# Patient Record
Sex: Female | Born: 1940 | Hispanic: Refuse to answer | Marital: Married | Smoking: Former smoker
Health system: Southern US, Community
[De-identification: ages and names within clinical notes are randomized; demographics above are authoritative.]

## PROBLEM LIST (undated history)

## (undated) DIAGNOSIS — I10 Essential (primary) hypertension: Secondary | ICD-10-CM

## (undated) DIAGNOSIS — R269 Unspecified abnormalities of gait and mobility: Secondary | ICD-10-CM

## (undated) DIAGNOSIS — J449 Chronic obstructive pulmonary disease, unspecified: Secondary | ICD-10-CM

## (undated) DIAGNOSIS — M1990 Unspecified osteoarthritis, unspecified site: Secondary | ICD-10-CM

## (undated) DIAGNOSIS — I259 Chronic ischemic heart disease, unspecified: Secondary | ICD-10-CM

## (undated) DIAGNOSIS — K5090 Crohn's disease, unspecified, without complications: Secondary | ICD-10-CM

## (undated) DIAGNOSIS — E119 Type 2 diabetes mellitus without complications: Secondary | ICD-10-CM

## (undated) DIAGNOSIS — C801 Malignant (primary) neoplasm, unspecified: Secondary | ICD-10-CM

## (undated) DIAGNOSIS — F17200 Nicotine dependence, unspecified, uncomplicated: Secondary | ICD-10-CM

## (undated) DIAGNOSIS — Z87442 Personal history of urinary calculi: Secondary | ICD-10-CM

## (undated) DIAGNOSIS — Z720 Tobacco use: Secondary | ICD-10-CM

## (undated) DIAGNOSIS — R42 Dizziness and giddiness: Secondary | ICD-10-CM

## (undated) DIAGNOSIS — I82409 Acute embolism and thrombosis of unspecified deep veins of unspecified lower extremity: Secondary | ICD-10-CM

## (undated) DIAGNOSIS — R0600 Dyspnea, unspecified: Secondary | ICD-10-CM

## (undated) DIAGNOSIS — N3281 Overactive bladder: Secondary | ICD-10-CM

## (undated) DIAGNOSIS — I712 Thoracic aortic aneurysm, without rupture: Secondary | ICD-10-CM

## (undated) HISTORY — DX: Thoracic aortic aneurysm, without rupture: I71.2

## (undated) HISTORY — DX: Acute embolism and thrombosis of unspecified deep veins of unspecified lower extremity: I82.409

## (undated) HISTORY — PX: OTHER SURGICAL HISTORY: SHX169

## (undated) HISTORY — DX: Essential (primary) hypertension: I10

## (undated) HISTORY — DX: Dizziness and giddiness: R42

## (undated) HISTORY — PX: TUBAL LIGATION: SHX77

## (undated) HISTORY — DX: Chronic ischemic heart disease, unspecified: I25.9

## (undated) HISTORY — DX: Type 2 diabetes mellitus without complications: E11.9

## (undated) HISTORY — DX: Unspecified abnormalities of gait and mobility: R26.9

## (undated) HISTORY — PX: JOINT REPLACEMENT: SHX530

## (undated) HISTORY — DX: Crohn's disease, unspecified, without complications: K50.90

## (undated) HISTORY — PX: CHOLECYSTECTOMY: SHX55

## (undated) HISTORY — PX: BLADDER SURGERY: SHX569

## (undated) HISTORY — PX: FRACTURE SURGERY: SHX138

## (undated) HISTORY — DX: Tobacco use: Z72.0

---

## 2002-12-15 DIAGNOSIS — C801 Malignant (primary) neoplasm, unspecified: Secondary | ICD-10-CM

## 2002-12-15 HISTORY — DX: Malignant (primary) neoplasm, unspecified: C80.1

## 2013-12-21 DIAGNOSIS — T8132XA Disruption of internal operation (surgical) wound, not elsewhere classified, initial encounter: Secondary | ICD-10-CM | POA: Diagnosis not present

## 2013-12-21 DIAGNOSIS — Q666 Other congenital valgus deformities of feet: Secondary | ICD-10-CM | POA: Diagnosis not present

## 2013-12-21 DIAGNOSIS — M216X9 Other acquired deformities of unspecified foot: Secondary | ICD-10-CM | POA: Diagnosis not present

## 2014-01-05 DIAGNOSIS — Q666 Other congenital valgus deformities of feet: Secondary | ICD-10-CM | POA: Diagnosis not present

## 2014-01-05 DIAGNOSIS — T8132XA Disruption of internal operation (surgical) wound, not elsewhere classified, initial encounter: Secondary | ICD-10-CM | POA: Diagnosis not present

## 2014-01-18 DIAGNOSIS — T8132XA Disruption of internal operation (surgical) wound, not elsewhere classified, initial encounter: Secondary | ICD-10-CM | POA: Diagnosis not present

## 2014-01-18 DIAGNOSIS — Q666 Other congenital valgus deformities of feet: Secondary | ICD-10-CM | POA: Diagnosis not present

## 2014-01-23 DIAGNOSIS — Z7982 Long term (current) use of aspirin: Secondary | ICD-10-CM | POA: Diagnosis not present

## 2014-01-23 DIAGNOSIS — K5090 Crohn's disease, unspecified, without complications: Secondary | ICD-10-CM | POA: Diagnosis not present

## 2014-01-23 DIAGNOSIS — T8189XA Other complications of procedures, not elsewhere classified, initial encounter: Secondary | ICD-10-CM | POA: Diagnosis not present

## 2014-01-23 DIAGNOSIS — Z8551 Personal history of malignant neoplasm of bladder: Secondary | ICD-10-CM | POA: Diagnosis not present

## 2014-01-23 DIAGNOSIS — H259 Unspecified age-related cataract: Secondary | ICD-10-CM | POA: Diagnosis not present

## 2014-01-23 DIAGNOSIS — Z96659 Presence of unspecified artificial knee joint: Secondary | ICD-10-CM | POA: Diagnosis not present

## 2014-01-23 DIAGNOSIS — J449 Chronic obstructive pulmonary disease, unspecified: Secondary | ICD-10-CM | POA: Diagnosis not present

## 2014-01-23 DIAGNOSIS — M159 Polyosteoarthritis, unspecified: Secondary | ICD-10-CM | POA: Diagnosis not present

## 2014-01-23 DIAGNOSIS — I10 Essential (primary) hypertension: Secondary | ICD-10-CM | POA: Diagnosis not present

## 2014-01-23 DIAGNOSIS — M810 Age-related osteoporosis without current pathological fracture: Secondary | ICD-10-CM | POA: Diagnosis not present

## 2014-01-23 DIAGNOSIS — Z4689 Encounter for fitting and adjustment of other specified devices: Secondary | ICD-10-CM | POA: Diagnosis not present

## 2014-01-26 DIAGNOSIS — J449 Chronic obstructive pulmonary disease, unspecified: Secondary | ICD-10-CM | POA: Diagnosis not present

## 2014-01-26 DIAGNOSIS — I10 Essential (primary) hypertension: Secondary | ICD-10-CM | POA: Diagnosis not present

## 2014-01-26 DIAGNOSIS — T8189XA Other complications of procedures, not elsewhere classified, initial encounter: Secondary | ICD-10-CM | POA: Diagnosis not present

## 2014-01-26 DIAGNOSIS — M810 Age-related osteoporosis without current pathological fracture: Secondary | ICD-10-CM | POA: Diagnosis not present

## 2014-01-26 DIAGNOSIS — K5090 Crohn's disease, unspecified, without complications: Secondary | ICD-10-CM | POA: Diagnosis not present

## 2014-01-26 DIAGNOSIS — M159 Polyosteoarthritis, unspecified: Secondary | ICD-10-CM | POA: Diagnosis not present

## 2014-01-28 DIAGNOSIS — M159 Polyosteoarthritis, unspecified: Secondary | ICD-10-CM | POA: Diagnosis not present

## 2014-01-28 DIAGNOSIS — K5090 Crohn's disease, unspecified, without complications: Secondary | ICD-10-CM | POA: Diagnosis not present

## 2014-01-28 DIAGNOSIS — I10 Essential (primary) hypertension: Secondary | ICD-10-CM | POA: Diagnosis not present

## 2014-01-28 DIAGNOSIS — T8189XA Other complications of procedures, not elsewhere classified, initial encounter: Secondary | ICD-10-CM | POA: Diagnosis not present

## 2014-01-28 DIAGNOSIS — M810 Age-related osteoporosis without current pathological fracture: Secondary | ICD-10-CM | POA: Diagnosis not present

## 2014-01-28 DIAGNOSIS — J449 Chronic obstructive pulmonary disease, unspecified: Secondary | ICD-10-CM | POA: Diagnosis not present

## 2014-01-30 DIAGNOSIS — I10 Essential (primary) hypertension: Secondary | ICD-10-CM | POA: Diagnosis not present

## 2014-01-30 DIAGNOSIS — K5090 Crohn's disease, unspecified, without complications: Secondary | ICD-10-CM | POA: Diagnosis not present

## 2014-01-30 DIAGNOSIS — M810 Age-related osteoporosis without current pathological fracture: Secondary | ICD-10-CM | POA: Diagnosis not present

## 2014-01-30 DIAGNOSIS — J449 Chronic obstructive pulmonary disease, unspecified: Secondary | ICD-10-CM | POA: Diagnosis not present

## 2014-01-30 DIAGNOSIS — M159 Polyosteoarthritis, unspecified: Secondary | ICD-10-CM | POA: Diagnosis not present

## 2014-01-30 DIAGNOSIS — T8189XA Other complications of procedures, not elsewhere classified, initial encounter: Secondary | ICD-10-CM | POA: Diagnosis not present

## 2014-02-01 DIAGNOSIS — M79609 Pain in unspecified limb: Secondary | ICD-10-CM | POA: Diagnosis not present

## 2014-02-01 DIAGNOSIS — T8189XA Other complications of procedures, not elsewhere classified, initial encounter: Secondary | ICD-10-CM | POA: Diagnosis not present

## 2014-02-01 DIAGNOSIS — M810 Age-related osteoporosis without current pathological fracture: Secondary | ICD-10-CM | POA: Diagnosis not present

## 2014-02-01 DIAGNOSIS — J449 Chronic obstructive pulmonary disease, unspecified: Secondary | ICD-10-CM | POA: Diagnosis not present

## 2014-02-01 DIAGNOSIS — I10 Essential (primary) hypertension: Secondary | ICD-10-CM | POA: Diagnosis not present

## 2014-02-01 DIAGNOSIS — Q666 Other congenital valgus deformities of feet: Secondary | ICD-10-CM | POA: Diagnosis not present

## 2014-02-01 DIAGNOSIS — M159 Polyosteoarthritis, unspecified: Secondary | ICD-10-CM | POA: Diagnosis not present

## 2014-02-01 DIAGNOSIS — M24876 Other specific joint derangements of unspecified foot, not elsewhere classified: Secondary | ICD-10-CM | POA: Diagnosis not present

## 2014-02-01 DIAGNOSIS — T8132XA Disruption of internal operation (surgical) wound, not elsewhere classified, initial encounter: Secondary | ICD-10-CM | POA: Diagnosis not present

## 2014-02-01 DIAGNOSIS — K5090 Crohn's disease, unspecified, without complications: Secondary | ICD-10-CM | POA: Diagnosis not present

## 2014-02-01 DIAGNOSIS — M24873 Other specific joint derangements of unspecified ankle, not elsewhere classified: Secondary | ICD-10-CM | POA: Diagnosis not present

## 2014-02-03 DIAGNOSIS — M159 Polyosteoarthritis, unspecified: Secondary | ICD-10-CM | POA: Diagnosis not present

## 2014-02-03 DIAGNOSIS — J449 Chronic obstructive pulmonary disease, unspecified: Secondary | ICD-10-CM | POA: Diagnosis not present

## 2014-02-03 DIAGNOSIS — T8189XA Other complications of procedures, not elsewhere classified, initial encounter: Secondary | ICD-10-CM | POA: Diagnosis not present

## 2014-02-03 DIAGNOSIS — M810 Age-related osteoporosis without current pathological fracture: Secondary | ICD-10-CM | POA: Diagnosis not present

## 2014-02-03 DIAGNOSIS — I10 Essential (primary) hypertension: Secondary | ICD-10-CM | POA: Diagnosis not present

## 2014-02-03 DIAGNOSIS — K5090 Crohn's disease, unspecified, without complications: Secondary | ICD-10-CM | POA: Diagnosis not present

## 2014-02-06 DIAGNOSIS — M159 Polyosteoarthritis, unspecified: Secondary | ICD-10-CM | POA: Diagnosis not present

## 2014-02-06 DIAGNOSIS — K5090 Crohn's disease, unspecified, without complications: Secondary | ICD-10-CM | POA: Diagnosis not present

## 2014-02-06 DIAGNOSIS — M810 Age-related osteoporosis without current pathological fracture: Secondary | ICD-10-CM | POA: Diagnosis not present

## 2014-02-06 DIAGNOSIS — I10 Essential (primary) hypertension: Secondary | ICD-10-CM | POA: Diagnosis not present

## 2014-02-06 DIAGNOSIS — T8189XA Other complications of procedures, not elsewhere classified, initial encounter: Secondary | ICD-10-CM | POA: Diagnosis not present

## 2014-02-06 DIAGNOSIS — J449 Chronic obstructive pulmonary disease, unspecified: Secondary | ICD-10-CM | POA: Diagnosis not present

## 2014-02-08 DIAGNOSIS — K5090 Crohn's disease, unspecified, without complications: Secondary | ICD-10-CM | POA: Diagnosis not present

## 2014-02-08 DIAGNOSIS — T8189XA Other complications of procedures, not elsewhere classified, initial encounter: Secondary | ICD-10-CM | POA: Diagnosis not present

## 2014-02-08 DIAGNOSIS — M810 Age-related osteoporosis without current pathological fracture: Secondary | ICD-10-CM | POA: Diagnosis not present

## 2014-02-08 DIAGNOSIS — I10 Essential (primary) hypertension: Secondary | ICD-10-CM | POA: Diagnosis not present

## 2014-02-08 DIAGNOSIS — M159 Polyosteoarthritis, unspecified: Secondary | ICD-10-CM | POA: Diagnosis not present

## 2014-02-08 DIAGNOSIS — J449 Chronic obstructive pulmonary disease, unspecified: Secondary | ICD-10-CM | POA: Diagnosis not present

## 2014-02-10 DIAGNOSIS — M159 Polyosteoarthritis, unspecified: Secondary | ICD-10-CM | POA: Diagnosis not present

## 2014-02-10 DIAGNOSIS — M810 Age-related osteoporosis without current pathological fracture: Secondary | ICD-10-CM | POA: Diagnosis not present

## 2014-02-10 DIAGNOSIS — J449 Chronic obstructive pulmonary disease, unspecified: Secondary | ICD-10-CM | POA: Diagnosis not present

## 2014-02-10 DIAGNOSIS — I10 Essential (primary) hypertension: Secondary | ICD-10-CM | POA: Diagnosis not present

## 2014-02-10 DIAGNOSIS — T8189XA Other complications of procedures, not elsewhere classified, initial encounter: Secondary | ICD-10-CM | POA: Diagnosis not present

## 2014-02-10 DIAGNOSIS — K5090 Crohn's disease, unspecified, without complications: Secondary | ICD-10-CM | POA: Diagnosis not present

## 2014-02-13 DIAGNOSIS — M159 Polyosteoarthritis, unspecified: Secondary | ICD-10-CM | POA: Diagnosis not present

## 2014-02-13 DIAGNOSIS — K5090 Crohn's disease, unspecified, without complications: Secondary | ICD-10-CM | POA: Diagnosis not present

## 2014-02-13 DIAGNOSIS — I10 Essential (primary) hypertension: Secondary | ICD-10-CM | POA: Diagnosis not present

## 2014-02-13 DIAGNOSIS — M810 Age-related osteoporosis without current pathological fracture: Secondary | ICD-10-CM | POA: Diagnosis not present

## 2014-02-13 DIAGNOSIS — J449 Chronic obstructive pulmonary disease, unspecified: Secondary | ICD-10-CM | POA: Diagnosis not present

## 2014-02-13 DIAGNOSIS — T8189XA Other complications of procedures, not elsewhere classified, initial encounter: Secondary | ICD-10-CM | POA: Diagnosis not present

## 2014-02-15 DIAGNOSIS — M79609 Pain in unspecified limb: Secondary | ICD-10-CM | POA: Diagnosis not present

## 2014-02-15 DIAGNOSIS — T8132XA Disruption of internal operation (surgical) wound, not elsewhere classified, initial encounter: Secondary | ICD-10-CM | POA: Diagnosis not present

## 2014-02-15 DIAGNOSIS — L97409 Non-pressure chronic ulcer of unspecified heel and midfoot with unspecified severity: Secondary | ICD-10-CM | POA: Diagnosis not present

## 2014-02-15 DIAGNOSIS — R609 Edema, unspecified: Secondary | ICD-10-CM | POA: Diagnosis not present

## 2014-02-18 DIAGNOSIS — M159 Polyosteoarthritis, unspecified: Secondary | ICD-10-CM | POA: Diagnosis not present

## 2014-02-18 DIAGNOSIS — T8189XA Other complications of procedures, not elsewhere classified, initial encounter: Secondary | ICD-10-CM | POA: Diagnosis not present

## 2014-02-18 DIAGNOSIS — M810 Age-related osteoporosis without current pathological fracture: Secondary | ICD-10-CM | POA: Diagnosis not present

## 2014-02-18 DIAGNOSIS — K5090 Crohn's disease, unspecified, without complications: Secondary | ICD-10-CM | POA: Diagnosis not present

## 2014-02-18 DIAGNOSIS — J449 Chronic obstructive pulmonary disease, unspecified: Secondary | ICD-10-CM | POA: Diagnosis not present

## 2014-02-18 DIAGNOSIS — I10 Essential (primary) hypertension: Secondary | ICD-10-CM | POA: Diagnosis not present

## 2014-02-20 DIAGNOSIS — T8189XA Other complications of procedures, not elsewhere classified, initial encounter: Secondary | ICD-10-CM | POA: Diagnosis not present

## 2014-02-20 DIAGNOSIS — K5090 Crohn's disease, unspecified, without complications: Secondary | ICD-10-CM | POA: Diagnosis not present

## 2014-02-20 DIAGNOSIS — I10 Essential (primary) hypertension: Secondary | ICD-10-CM | POA: Diagnosis not present

## 2014-02-20 DIAGNOSIS — M159 Polyosteoarthritis, unspecified: Secondary | ICD-10-CM | POA: Diagnosis not present

## 2014-02-20 DIAGNOSIS — J449 Chronic obstructive pulmonary disease, unspecified: Secondary | ICD-10-CM | POA: Diagnosis not present

## 2014-02-20 DIAGNOSIS — M810 Age-related osteoporosis without current pathological fracture: Secondary | ICD-10-CM | POA: Diagnosis not present

## 2014-02-22 DIAGNOSIS — J449 Chronic obstructive pulmonary disease, unspecified: Secondary | ICD-10-CM | POA: Diagnosis not present

## 2014-02-22 DIAGNOSIS — M159 Polyosteoarthritis, unspecified: Secondary | ICD-10-CM | POA: Diagnosis not present

## 2014-02-22 DIAGNOSIS — K5090 Crohn's disease, unspecified, without complications: Secondary | ICD-10-CM | POA: Diagnosis not present

## 2014-02-22 DIAGNOSIS — M810 Age-related osteoporosis without current pathological fracture: Secondary | ICD-10-CM | POA: Diagnosis not present

## 2014-02-22 DIAGNOSIS — I10 Essential (primary) hypertension: Secondary | ICD-10-CM | POA: Diagnosis not present

## 2014-02-22 DIAGNOSIS — T8189XA Other complications of procedures, not elsewhere classified, initial encounter: Secondary | ICD-10-CM | POA: Diagnosis not present

## 2014-02-24 DIAGNOSIS — J449 Chronic obstructive pulmonary disease, unspecified: Secondary | ICD-10-CM | POA: Diagnosis not present

## 2014-02-24 DIAGNOSIS — T8189XA Other complications of procedures, not elsewhere classified, initial encounter: Secondary | ICD-10-CM | POA: Diagnosis not present

## 2014-02-24 DIAGNOSIS — M159 Polyosteoarthritis, unspecified: Secondary | ICD-10-CM | POA: Diagnosis not present

## 2014-02-24 DIAGNOSIS — K5090 Crohn's disease, unspecified, without complications: Secondary | ICD-10-CM | POA: Diagnosis not present

## 2014-02-24 DIAGNOSIS — I10 Essential (primary) hypertension: Secondary | ICD-10-CM | POA: Diagnosis not present

## 2014-02-24 DIAGNOSIS — M810 Age-related osteoporosis without current pathological fracture: Secondary | ICD-10-CM | POA: Diagnosis not present

## 2014-02-27 DIAGNOSIS — K5090 Crohn's disease, unspecified, without complications: Secondary | ICD-10-CM | POA: Diagnosis not present

## 2014-02-27 DIAGNOSIS — I10 Essential (primary) hypertension: Secondary | ICD-10-CM | POA: Diagnosis not present

## 2014-02-27 DIAGNOSIS — M159 Polyosteoarthritis, unspecified: Secondary | ICD-10-CM | POA: Diagnosis not present

## 2014-02-27 DIAGNOSIS — T8189XA Other complications of procedures, not elsewhere classified, initial encounter: Secondary | ICD-10-CM | POA: Diagnosis not present

## 2014-02-27 DIAGNOSIS — J449 Chronic obstructive pulmonary disease, unspecified: Secondary | ICD-10-CM | POA: Diagnosis not present

## 2014-02-27 DIAGNOSIS — M810 Age-related osteoporosis without current pathological fracture: Secondary | ICD-10-CM | POA: Diagnosis not present

## 2014-02-28 DIAGNOSIS — I10 Essential (primary) hypertension: Secondary | ICD-10-CM | POA: Diagnosis not present

## 2014-02-28 DIAGNOSIS — J449 Chronic obstructive pulmonary disease, unspecified: Secondary | ICD-10-CM | POA: Diagnosis not present

## 2014-02-28 DIAGNOSIS — Z9889 Other specified postprocedural states: Secondary | ICD-10-CM | POA: Diagnosis not present

## 2014-03-01 DIAGNOSIS — L02619 Cutaneous abscess of unspecified foot: Secondary | ICD-10-CM | POA: Diagnosis not present

## 2014-03-01 DIAGNOSIS — T8132XA Disruption of internal operation (surgical) wound, not elsewhere classified, initial encounter: Secondary | ICD-10-CM | POA: Diagnosis not present

## 2014-03-01 DIAGNOSIS — L03119 Cellulitis of unspecified part of limb: Secondary | ICD-10-CM | POA: Diagnosis not present

## 2014-03-01 DIAGNOSIS — R609 Edema, unspecified: Secondary | ICD-10-CM | POA: Diagnosis not present

## 2014-03-03 DIAGNOSIS — J449 Chronic obstructive pulmonary disease, unspecified: Secondary | ICD-10-CM | POA: Diagnosis not present

## 2014-03-03 DIAGNOSIS — K5090 Crohn's disease, unspecified, without complications: Secondary | ICD-10-CM | POA: Diagnosis not present

## 2014-03-03 DIAGNOSIS — M810 Age-related osteoporosis without current pathological fracture: Secondary | ICD-10-CM | POA: Diagnosis not present

## 2014-03-03 DIAGNOSIS — I10 Essential (primary) hypertension: Secondary | ICD-10-CM | POA: Diagnosis not present

## 2014-03-03 DIAGNOSIS — M159 Polyosteoarthritis, unspecified: Secondary | ICD-10-CM | POA: Diagnosis not present

## 2014-03-03 DIAGNOSIS — T8189XA Other complications of procedures, not elsewhere classified, initial encounter: Secondary | ICD-10-CM | POA: Diagnosis not present

## 2014-03-06 DIAGNOSIS — J449 Chronic obstructive pulmonary disease, unspecified: Secondary | ICD-10-CM | POA: Diagnosis not present

## 2014-03-06 DIAGNOSIS — K5090 Crohn's disease, unspecified, without complications: Secondary | ICD-10-CM | POA: Diagnosis not present

## 2014-03-06 DIAGNOSIS — M159 Polyosteoarthritis, unspecified: Secondary | ICD-10-CM | POA: Diagnosis not present

## 2014-03-06 DIAGNOSIS — T8189XA Other complications of procedures, not elsewhere classified, initial encounter: Secondary | ICD-10-CM | POA: Diagnosis not present

## 2014-03-06 DIAGNOSIS — M810 Age-related osteoporosis without current pathological fracture: Secondary | ICD-10-CM | POA: Diagnosis not present

## 2014-03-06 DIAGNOSIS — I10 Essential (primary) hypertension: Secondary | ICD-10-CM | POA: Diagnosis not present

## 2014-03-08 DIAGNOSIS — K5090 Crohn's disease, unspecified, without complications: Secondary | ICD-10-CM | POA: Diagnosis not present

## 2014-03-08 DIAGNOSIS — M810 Age-related osteoporosis without current pathological fracture: Secondary | ICD-10-CM | POA: Diagnosis not present

## 2014-03-08 DIAGNOSIS — J449 Chronic obstructive pulmonary disease, unspecified: Secondary | ICD-10-CM | POA: Diagnosis not present

## 2014-03-08 DIAGNOSIS — I10 Essential (primary) hypertension: Secondary | ICD-10-CM | POA: Diagnosis not present

## 2014-03-08 DIAGNOSIS — M159 Polyosteoarthritis, unspecified: Secondary | ICD-10-CM | POA: Diagnosis not present

## 2014-03-08 DIAGNOSIS — T8189XA Other complications of procedures, not elsewhere classified, initial encounter: Secondary | ICD-10-CM | POA: Diagnosis not present

## 2014-03-10 DIAGNOSIS — M810 Age-related osteoporosis without current pathological fracture: Secondary | ICD-10-CM | POA: Diagnosis not present

## 2014-03-10 DIAGNOSIS — M159 Polyosteoarthritis, unspecified: Secondary | ICD-10-CM | POA: Diagnosis not present

## 2014-03-10 DIAGNOSIS — J449 Chronic obstructive pulmonary disease, unspecified: Secondary | ICD-10-CM | POA: Diagnosis not present

## 2014-03-10 DIAGNOSIS — T8189XA Other complications of procedures, not elsewhere classified, initial encounter: Secondary | ICD-10-CM | POA: Diagnosis not present

## 2014-03-10 DIAGNOSIS — K5090 Crohn's disease, unspecified, without complications: Secondary | ICD-10-CM | POA: Diagnosis not present

## 2014-03-10 DIAGNOSIS — I10 Essential (primary) hypertension: Secondary | ICD-10-CM | POA: Diagnosis not present

## 2014-03-13 DIAGNOSIS — J449 Chronic obstructive pulmonary disease, unspecified: Secondary | ICD-10-CM | POA: Diagnosis not present

## 2014-03-13 DIAGNOSIS — I10 Essential (primary) hypertension: Secondary | ICD-10-CM | POA: Diagnosis not present

## 2014-03-13 DIAGNOSIS — K5090 Crohn's disease, unspecified, without complications: Secondary | ICD-10-CM | POA: Diagnosis not present

## 2014-03-13 DIAGNOSIS — M810 Age-related osteoporosis without current pathological fracture: Secondary | ICD-10-CM | POA: Diagnosis not present

## 2014-03-13 DIAGNOSIS — M159 Polyosteoarthritis, unspecified: Secondary | ICD-10-CM | POA: Diagnosis not present

## 2014-03-13 DIAGNOSIS — T8189XA Other complications of procedures, not elsewhere classified, initial encounter: Secondary | ICD-10-CM | POA: Diagnosis not present

## 2014-03-15 DIAGNOSIS — Q666 Other congenital valgus deformities of feet: Secondary | ICD-10-CM | POA: Diagnosis not present

## 2014-03-15 DIAGNOSIS — T8132XA Disruption of internal operation (surgical) wound, not elsewhere classified, initial encounter: Secondary | ICD-10-CM | POA: Diagnosis not present

## 2014-03-15 DIAGNOSIS — R609 Edema, unspecified: Secondary | ICD-10-CM | POA: Diagnosis not present

## 2014-03-17 DIAGNOSIS — K5090 Crohn's disease, unspecified, without complications: Secondary | ICD-10-CM | POA: Diagnosis not present

## 2014-03-17 DIAGNOSIS — I10 Essential (primary) hypertension: Secondary | ICD-10-CM | POA: Diagnosis not present

## 2014-03-17 DIAGNOSIS — M810 Age-related osteoporosis without current pathological fracture: Secondary | ICD-10-CM | POA: Diagnosis not present

## 2014-03-17 DIAGNOSIS — J449 Chronic obstructive pulmonary disease, unspecified: Secondary | ICD-10-CM | POA: Diagnosis not present

## 2014-03-17 DIAGNOSIS — T8189XA Other complications of procedures, not elsewhere classified, initial encounter: Secondary | ICD-10-CM | POA: Diagnosis not present

## 2014-03-17 DIAGNOSIS — M159 Polyosteoarthritis, unspecified: Secondary | ICD-10-CM | POA: Diagnosis not present

## 2014-03-22 DIAGNOSIS — M79609 Pain in unspecified limb: Secondary | ICD-10-CM | POA: Diagnosis not present

## 2014-03-22 DIAGNOSIS — T8132XA Disruption of internal operation (surgical) wound, not elsewhere classified, initial encounter: Secondary | ICD-10-CM | POA: Diagnosis not present

## 2014-03-22 DIAGNOSIS — Q666 Other congenital valgus deformities of feet: Secondary | ICD-10-CM | POA: Diagnosis not present

## 2014-04-05 DIAGNOSIS — Q666 Other congenital valgus deformities of feet: Secondary | ICD-10-CM | POA: Diagnosis not present

## 2014-04-05 DIAGNOSIS — T8132XA Disruption of internal operation (surgical) wound, not elsewhere classified, initial encounter: Secondary | ICD-10-CM | POA: Diagnosis not present

## 2014-04-05 DIAGNOSIS — R609 Edema, unspecified: Secondary | ICD-10-CM | POA: Diagnosis not present

## 2014-04-26 DIAGNOSIS — M24573 Contracture, unspecified ankle: Secondary | ICD-10-CM | POA: Diagnosis not present

## 2014-04-26 DIAGNOSIS — T8132XA Disruption of internal operation (surgical) wound, not elsewhere classified, initial encounter: Secondary | ICD-10-CM | POA: Diagnosis not present

## 2014-04-26 DIAGNOSIS — R609 Edema, unspecified: Secondary | ICD-10-CM | POA: Diagnosis not present

## 2014-05-01 DIAGNOSIS — M24876 Other specific joint derangements of unspecified foot, not elsewhere classified: Secondary | ICD-10-CM | POA: Diagnosis not present

## 2014-05-01 DIAGNOSIS — L97309 Non-pressure chronic ulcer of unspecified ankle with unspecified severity: Secondary | ICD-10-CM | POA: Diagnosis not present

## 2014-05-01 DIAGNOSIS — R262 Difficulty in walking, not elsewhere classified: Secondary | ICD-10-CM | POA: Diagnosis not present

## 2014-05-01 DIAGNOSIS — M24873 Other specific joint derangements of unspecified ankle, not elsewhere classified: Secondary | ICD-10-CM | POA: Diagnosis not present

## 2014-05-03 DIAGNOSIS — M24873 Other specific joint derangements of unspecified ankle, not elsewhere classified: Secondary | ICD-10-CM | POA: Diagnosis not present

## 2014-05-03 DIAGNOSIS — L97309 Non-pressure chronic ulcer of unspecified ankle with unspecified severity: Secondary | ICD-10-CM | POA: Diagnosis not present

## 2014-05-03 DIAGNOSIS — R262 Difficulty in walking, not elsewhere classified: Secondary | ICD-10-CM | POA: Diagnosis not present

## 2014-05-10 DIAGNOSIS — N390 Urinary tract infection, site not specified: Secondary | ICD-10-CM | POA: Diagnosis not present

## 2014-05-10 DIAGNOSIS — N3941 Urge incontinence: Secondary | ICD-10-CM | POA: Diagnosis not present

## 2014-05-10 DIAGNOSIS — M24876 Other specific joint derangements of unspecified foot, not elsewhere classified: Secondary | ICD-10-CM | POA: Diagnosis not present

## 2014-05-10 DIAGNOSIS — Q619 Cystic kidney disease, unspecified: Secondary | ICD-10-CM | POA: Diagnosis not present

## 2014-05-10 DIAGNOSIS — L97309 Non-pressure chronic ulcer of unspecified ankle with unspecified severity: Secondary | ICD-10-CM | POA: Diagnosis not present

## 2014-05-10 DIAGNOSIS — M24873 Other specific joint derangements of unspecified ankle, not elsewhere classified: Secondary | ICD-10-CM | POA: Diagnosis not present

## 2014-05-10 DIAGNOSIS — R262 Difficulty in walking, not elsewhere classified: Secondary | ICD-10-CM | POA: Diagnosis not present

## 2014-05-17 DIAGNOSIS — T8132XA Disruption of internal operation (surgical) wound, not elsewhere classified, initial encounter: Secondary | ICD-10-CM | POA: Diagnosis not present

## 2014-05-17 DIAGNOSIS — Q666 Other congenital valgus deformities of feet: Secondary | ICD-10-CM | POA: Diagnosis not present

## 2014-05-17 DIAGNOSIS — S93336A Other dislocation of unspecified foot, initial encounter: Secondary | ICD-10-CM | POA: Diagnosis not present

## 2014-06-02 DIAGNOSIS — R262 Difficulty in walking, not elsewhere classified: Secondary | ICD-10-CM | POA: Diagnosis not present

## 2014-06-02 DIAGNOSIS — L97309 Non-pressure chronic ulcer of unspecified ankle with unspecified severity: Secondary | ICD-10-CM | POA: Diagnosis not present

## 2014-06-02 DIAGNOSIS — M24873 Other specific joint derangements of unspecified ankle, not elsewhere classified: Secondary | ICD-10-CM | POA: Diagnosis not present

## 2014-06-02 DIAGNOSIS — M24876 Other specific joint derangements of unspecified foot, not elsewhere classified: Secondary | ICD-10-CM | POA: Diagnosis not present

## 2014-06-07 DIAGNOSIS — M24876 Other specific joint derangements of unspecified foot, not elsewhere classified: Secondary | ICD-10-CM | POA: Diagnosis not present

## 2014-06-07 DIAGNOSIS — L97309 Non-pressure chronic ulcer of unspecified ankle with unspecified severity: Secondary | ICD-10-CM | POA: Diagnosis not present

## 2014-06-07 DIAGNOSIS — M24873 Other specific joint derangements of unspecified ankle, not elsewhere classified: Secondary | ICD-10-CM | POA: Diagnosis not present

## 2014-06-07 DIAGNOSIS — R262 Difficulty in walking, not elsewhere classified: Secondary | ICD-10-CM | POA: Diagnosis not present

## 2014-06-14 DIAGNOSIS — M79609 Pain in unspecified limb: Secondary | ICD-10-CM | POA: Diagnosis not present

## 2014-06-14 DIAGNOSIS — R609 Edema, unspecified: Secondary | ICD-10-CM | POA: Diagnosis not present

## 2014-06-14 DIAGNOSIS — L97409 Non-pressure chronic ulcer of unspecified heel and midfoot with unspecified severity: Secondary | ICD-10-CM | POA: Diagnosis not present

## 2014-06-22 DIAGNOSIS — T8132XA Disruption of internal operation (surgical) wound, not elsewhere classified, initial encounter: Secondary | ICD-10-CM | POA: Diagnosis not present

## 2014-06-22 DIAGNOSIS — R609 Edema, unspecified: Secondary | ICD-10-CM | POA: Diagnosis not present

## 2014-06-28 DIAGNOSIS — L02619 Cutaneous abscess of unspecified foot: Secondary | ICD-10-CM | POA: Diagnosis not present

## 2014-06-28 DIAGNOSIS — R609 Edema, unspecified: Secondary | ICD-10-CM | POA: Diagnosis not present

## 2014-06-28 DIAGNOSIS — T8131XA Disruption of external operation (surgical) wound, not elsewhere classified, initial encounter: Secondary | ICD-10-CM | POA: Diagnosis not present

## 2014-07-05 DIAGNOSIS — Q666 Other congenital valgus deformities of feet: Secondary | ICD-10-CM | POA: Diagnosis not present

## 2014-07-05 DIAGNOSIS — R609 Edema, unspecified: Secondary | ICD-10-CM | POA: Diagnosis not present

## 2014-07-05 DIAGNOSIS — T8132XA Disruption of internal operation (surgical) wound, not elsewhere classified, initial encounter: Secondary | ICD-10-CM | POA: Diagnosis not present

## 2014-07-12 DIAGNOSIS — Q666 Other congenital valgus deformities of feet: Secondary | ICD-10-CM | POA: Diagnosis not present

## 2014-07-12 DIAGNOSIS — S91309A Unspecified open wound, unspecified foot, initial encounter: Secondary | ICD-10-CM | POA: Diagnosis not present

## 2014-07-12 DIAGNOSIS — R609 Edema, unspecified: Secondary | ICD-10-CM | POA: Diagnosis not present

## 2014-07-26 DIAGNOSIS — T8132XA Disruption of internal operation (surgical) wound, not elsewhere classified, initial encounter: Secondary | ICD-10-CM | POA: Diagnosis not present

## 2014-07-26 DIAGNOSIS — R609 Edema, unspecified: Secondary | ICD-10-CM | POA: Diagnosis not present

## 2014-07-26 DIAGNOSIS — R269 Unspecified abnormalities of gait and mobility: Secondary | ICD-10-CM | POA: Diagnosis not present

## 2014-08-02 DIAGNOSIS — R609 Edema, unspecified: Secondary | ICD-10-CM | POA: Diagnosis not present

## 2014-08-02 DIAGNOSIS — R269 Unspecified abnormalities of gait and mobility: Secondary | ICD-10-CM | POA: Diagnosis not present

## 2014-08-02 DIAGNOSIS — T8132XA Disruption of internal operation (surgical) wound, not elsewhere classified, initial encounter: Secondary | ICD-10-CM | POA: Diagnosis not present

## 2014-08-09 DIAGNOSIS — M24876 Other specific joint derangements of unspecified foot, not elsewhere classified: Secondary | ICD-10-CM | POA: Diagnosis not present

## 2014-08-09 DIAGNOSIS — T8132XA Disruption of internal operation (surgical) wound, not elsewhere classified, initial encounter: Secondary | ICD-10-CM | POA: Diagnosis not present

## 2014-08-09 DIAGNOSIS — Q666 Other congenital valgus deformities of feet: Secondary | ICD-10-CM | POA: Diagnosis not present

## 2014-08-09 DIAGNOSIS — M24873 Other specific joint derangements of unspecified ankle, not elsewhere classified: Secondary | ICD-10-CM | POA: Diagnosis not present

## 2014-08-30 DIAGNOSIS — S91309A Unspecified open wound, unspecified foot, initial encounter: Secondary | ICD-10-CM | POA: Diagnosis not present

## 2014-08-30 DIAGNOSIS — R609 Edema, unspecified: Secondary | ICD-10-CM | POA: Diagnosis not present

## 2014-08-30 DIAGNOSIS — Q666 Other congenital valgus deformities of feet: Secondary | ICD-10-CM | POA: Diagnosis not present

## 2014-09-19 DIAGNOSIS — Z85828 Personal history of other malignant neoplasm of skin: Secondary | ICD-10-CM | POA: Diagnosis not present

## 2014-09-20 DIAGNOSIS — R339 Retention of urine, unspecified: Secondary | ICD-10-CM | POA: Diagnosis not present

## 2014-09-20 DIAGNOSIS — Q6100 Congenital renal cyst, unspecified: Secondary | ICD-10-CM | POA: Diagnosis not present

## 2014-09-26 DIAGNOSIS — R339 Retention of urine, unspecified: Secondary | ICD-10-CM | POA: Diagnosis not present

## 2014-10-02 DIAGNOSIS — R600 Localized edema: Secondary | ICD-10-CM | POA: Diagnosis not present

## 2014-10-02 DIAGNOSIS — M79672 Pain in left foot: Secondary | ICD-10-CM | POA: Diagnosis not present

## 2014-10-02 DIAGNOSIS — L97423 Non-pressure chronic ulcer of left heel and midfoot with necrosis of muscle: Secondary | ICD-10-CM | POA: Diagnosis not present

## 2014-10-03 DIAGNOSIS — M1990 Unspecified osteoarthritis, unspecified site: Secondary | ICD-10-CM | POA: Diagnosis not present

## 2014-10-03 DIAGNOSIS — I10 Essential (primary) hypertension: Secondary | ICD-10-CM | POA: Diagnosis not present

## 2014-10-03 DIAGNOSIS — J449 Chronic obstructive pulmonary disease, unspecified: Secondary | ICD-10-CM | POA: Diagnosis not present

## 2014-10-03 DIAGNOSIS — L97529 Non-pressure chronic ulcer of other part of left foot with unspecified severity: Secondary | ICD-10-CM | POA: Diagnosis not present

## 2014-10-03 DIAGNOSIS — F17210 Nicotine dependence, cigarettes, uncomplicated: Secondary | ICD-10-CM | POA: Diagnosis not present

## 2014-10-03 DIAGNOSIS — Z79899 Other long term (current) drug therapy: Secondary | ICD-10-CM | POA: Diagnosis not present

## 2014-10-03 DIAGNOSIS — K5090 Crohn's disease, unspecified, without complications: Secondary | ICD-10-CM | POA: Diagnosis not present

## 2014-10-03 DIAGNOSIS — M810 Age-related osteoporosis without current pathological fracture: Secondary | ICD-10-CM | POA: Diagnosis not present

## 2014-10-03 DIAGNOSIS — Z7982 Long term (current) use of aspirin: Secondary | ICD-10-CM | POA: Diagnosis not present

## 2014-10-04 DIAGNOSIS — F17210 Nicotine dependence, cigarettes, uncomplicated: Secondary | ICD-10-CM | POA: Diagnosis not present

## 2014-10-04 DIAGNOSIS — M1990 Unspecified osteoarthritis, unspecified site: Secondary | ICD-10-CM | POA: Diagnosis not present

## 2014-10-04 DIAGNOSIS — L97529 Non-pressure chronic ulcer of other part of left foot with unspecified severity: Secondary | ICD-10-CM | POA: Diagnosis not present

## 2014-10-04 DIAGNOSIS — J449 Chronic obstructive pulmonary disease, unspecified: Secondary | ICD-10-CM | POA: Diagnosis not present

## 2014-10-04 DIAGNOSIS — M87075 Idiopathic aseptic necrosis of left foot: Secondary | ICD-10-CM | POA: Diagnosis not present

## 2014-10-04 DIAGNOSIS — I10 Essential (primary) hypertension: Secondary | ICD-10-CM | POA: Diagnosis not present

## 2014-10-04 DIAGNOSIS — Z79899 Other long term (current) drug therapy: Secondary | ICD-10-CM | POA: Diagnosis not present

## 2014-10-18 DIAGNOSIS — R600 Localized edema: Secondary | ICD-10-CM | POA: Diagnosis not present

## 2014-10-18 DIAGNOSIS — L97423 Non-pressure chronic ulcer of left heel and midfoot with necrosis of muscle: Secondary | ICD-10-CM | POA: Diagnosis not present

## 2014-10-18 DIAGNOSIS — S91302D Unspecified open wound, left foot, subsequent encounter: Secondary | ICD-10-CM | POA: Diagnosis not present

## 2014-10-18 DIAGNOSIS — M79672 Pain in left foot: Secondary | ICD-10-CM | POA: Diagnosis not present

## 2014-10-25 DIAGNOSIS — R600 Localized edema: Secondary | ICD-10-CM | POA: Diagnosis not present

## 2014-10-25 DIAGNOSIS — M79672 Pain in left foot: Secondary | ICD-10-CM | POA: Diagnosis not present

## 2014-10-25 DIAGNOSIS — S91302D Unspecified open wound, left foot, subsequent encounter: Secondary | ICD-10-CM | POA: Diagnosis not present

## 2014-10-25 DIAGNOSIS — L97423 Non-pressure chronic ulcer of left heel and midfoot with necrosis of muscle: Secondary | ICD-10-CM | POA: Diagnosis not present

## 2014-11-01 DIAGNOSIS — L97423 Non-pressure chronic ulcer of left heel and midfoot with necrosis of muscle: Secondary | ICD-10-CM | POA: Diagnosis not present

## 2014-11-01 DIAGNOSIS — R262 Difficulty in walking, not elsewhere classified: Secondary | ICD-10-CM | POA: Diagnosis not present

## 2014-11-01 DIAGNOSIS — S91302D Unspecified open wound, left foot, subsequent encounter: Secondary | ICD-10-CM | POA: Diagnosis not present

## 2014-11-01 DIAGNOSIS — M79672 Pain in left foot: Secondary | ICD-10-CM | POA: Diagnosis not present

## 2014-11-01 DIAGNOSIS — R600 Localized edema: Secondary | ICD-10-CM | POA: Diagnosis not present

## 2014-11-15 DIAGNOSIS — S93302D Unspecified subluxation of left foot, subsequent encounter: Secondary | ICD-10-CM | POA: Diagnosis not present

## 2014-11-15 DIAGNOSIS — R600 Localized edema: Secondary | ICD-10-CM | POA: Diagnosis not present

## 2014-11-15 DIAGNOSIS — Q664 Congenital talipes calcaneovalgus: Secondary | ICD-10-CM | POA: Diagnosis not present

## 2014-11-28 DIAGNOSIS — N3281 Overactive bladder: Secondary | ICD-10-CM | POA: Diagnosis not present

## 2014-11-28 DIAGNOSIS — J449 Chronic obstructive pulmonary disease, unspecified: Secondary | ICD-10-CM | POA: Diagnosis not present

## 2014-11-28 DIAGNOSIS — I10 Essential (primary) hypertension: Secondary | ICD-10-CM | POA: Diagnosis not present

## 2014-11-28 DIAGNOSIS — M810 Age-related osteoporosis without current pathological fracture: Secondary | ICD-10-CM | POA: Diagnosis not present

## 2014-11-29 DIAGNOSIS — D485 Neoplasm of uncertain behavior of skin: Secondary | ICD-10-CM | POA: Diagnosis not present

## 2014-11-29 DIAGNOSIS — L858 Other specified epidermal thickening: Secondary | ICD-10-CM | POA: Diagnosis not present

## 2014-12-04 DIAGNOSIS — S91302D Unspecified open wound, left foot, subsequent encounter: Secondary | ICD-10-CM | POA: Diagnosis not present

## 2014-12-04 DIAGNOSIS — Q600 Renal agenesis, unilateral: Secondary | ICD-10-CM | POA: Diagnosis not present

## 2014-12-04 DIAGNOSIS — R600 Localized edema: Secondary | ICD-10-CM | POA: Diagnosis not present

## 2014-12-18 DIAGNOSIS — Z1231 Encounter for screening mammogram for malignant neoplasm of breast: Secondary | ICD-10-CM | POA: Diagnosis not present

## 2015-01-10 DIAGNOSIS — R600 Localized edema: Secondary | ICD-10-CM | POA: Diagnosis not present

## 2015-01-10 DIAGNOSIS — S91302D Unspecified open wound, left foot, subsequent encounter: Secondary | ICD-10-CM | POA: Diagnosis not present

## 2015-01-10 DIAGNOSIS — Q664 Congenital talipes calcaneovalgus: Secondary | ICD-10-CM | POA: Diagnosis not present

## 2015-01-10 DIAGNOSIS — M79672 Pain in left foot: Secondary | ICD-10-CM | POA: Diagnosis not present

## 2015-01-10 DIAGNOSIS — R262 Difficulty in walking, not elsewhere classified: Secondary | ICD-10-CM | POA: Diagnosis not present

## 2015-01-19 DIAGNOSIS — N390 Urinary tract infection, site not specified: Secondary | ICD-10-CM | POA: Diagnosis not present

## 2015-01-19 DIAGNOSIS — N3941 Urge incontinence: Secondary | ICD-10-CM | POA: Diagnosis not present

## 2015-01-19 DIAGNOSIS — R339 Retention of urine, unspecified: Secondary | ICD-10-CM | POA: Diagnosis not present

## 2015-01-19 DIAGNOSIS — R350 Frequency of micturition: Secondary | ICD-10-CM | POA: Diagnosis not present

## 2015-02-07 DIAGNOSIS — C679 Malignant neoplasm of bladder, unspecified: Secondary | ICD-10-CM | POA: Diagnosis not present

## 2015-02-09 DIAGNOSIS — I10 Essential (primary) hypertension: Secondary | ICD-10-CM | POA: Diagnosis not present

## 2015-02-11 DIAGNOSIS — S46811A Strain of other muscles, fascia and tendons at shoulder and upper arm level, right arm, initial encounter: Secondary | ICD-10-CM | POA: Diagnosis not present

## 2015-02-14 DIAGNOSIS — R0602 Shortness of breath: Secondary | ICD-10-CM | POA: Diagnosis not present

## 2015-02-14 DIAGNOSIS — Z0000 Encounter for general adult medical examination without abnormal findings: Secondary | ICD-10-CM | POA: Diagnosis not present

## 2015-02-14 DIAGNOSIS — M25551 Pain in right hip: Secondary | ICD-10-CM | POA: Diagnosis not present

## 2015-02-21 DIAGNOSIS — M25519 Pain in unspecified shoulder: Secondary | ICD-10-CM | POA: Diagnosis not present

## 2015-02-21 DIAGNOSIS — M25559 Pain in unspecified hip: Secondary | ICD-10-CM | POA: Diagnosis not present

## 2015-02-21 DIAGNOSIS — I10 Essential (primary) hypertension: Secondary | ICD-10-CM | POA: Diagnosis not present

## 2015-02-22 DIAGNOSIS — I82412 Acute embolism and thrombosis of left femoral vein: Secondary | ICD-10-CM | POA: Diagnosis not present

## 2015-03-05 DIAGNOSIS — Z85828 Personal history of other malignant neoplasm of skin: Secondary | ICD-10-CM | POA: Diagnosis not present

## 2015-03-06 DIAGNOSIS — M25551 Pain in right hip: Secondary | ICD-10-CM | POA: Diagnosis not present

## 2015-03-14 DIAGNOSIS — M25559 Pain in unspecified hip: Secondary | ICD-10-CM | POA: Diagnosis not present

## 2015-03-14 DIAGNOSIS — M25519 Pain in unspecified shoulder: Secondary | ICD-10-CM | POA: Diagnosis not present

## 2015-04-10 DIAGNOSIS — M722 Plantar fascial fibromatosis: Secondary | ICD-10-CM | POA: Diagnosis not present

## 2015-04-10 DIAGNOSIS — Q664 Congenital talipes calcaneovalgus: Secondary | ICD-10-CM | POA: Diagnosis not present

## 2015-04-10 DIAGNOSIS — M79672 Pain in left foot: Secondary | ICD-10-CM | POA: Diagnosis not present

## 2015-04-10 DIAGNOSIS — M19072 Primary osteoarthritis, left ankle and foot: Secondary | ICD-10-CM | POA: Diagnosis not present

## 2015-04-10 DIAGNOSIS — R600 Localized edema: Secondary | ICD-10-CM | POA: Diagnosis not present

## 2015-04-10 DIAGNOSIS — S91302D Unspecified open wound, left foot, subsequent encounter: Secondary | ICD-10-CM | POA: Diagnosis not present

## 2015-04-10 DIAGNOSIS — R262 Difficulty in walking, not elsewhere classified: Secondary | ICD-10-CM | POA: Diagnosis not present

## 2015-04-12 DIAGNOSIS — R55 Syncope and collapse: Secondary | ICD-10-CM | POA: Diagnosis not present

## 2015-04-12 DIAGNOSIS — I10 Essential (primary) hypertension: Secondary | ICD-10-CM | POA: Diagnosis not present

## 2015-04-12 DIAGNOSIS — M25559 Pain in unspecified hip: Secondary | ICD-10-CM | POA: Diagnosis not present

## 2015-04-19 DIAGNOSIS — K648 Other hemorrhoids: Secondary | ICD-10-CM | POA: Diagnosis not present

## 2015-04-19 DIAGNOSIS — K5010 Crohn's disease of large intestine without complications: Secondary | ICD-10-CM | POA: Diagnosis not present

## 2015-04-19 DIAGNOSIS — K5090 Crohn's disease, unspecified, without complications: Secondary | ICD-10-CM | POA: Diagnosis not present

## 2015-04-19 DIAGNOSIS — Z86010 Personal history of colonic polyps: Secondary | ICD-10-CM | POA: Diagnosis not present

## 2015-04-19 DIAGNOSIS — K635 Polyp of colon: Secondary | ICD-10-CM | POA: Diagnosis not present

## 2015-04-20 DIAGNOSIS — R55 Syncope and collapse: Secondary | ICD-10-CM | POA: Diagnosis not present

## 2015-04-24 DIAGNOSIS — I6529 Occlusion and stenosis of unspecified carotid artery: Secondary | ICD-10-CM | POA: Diagnosis not present

## 2015-04-24 DIAGNOSIS — R42 Dizziness and giddiness: Secondary | ICD-10-CM | POA: Diagnosis not present

## 2015-04-24 DIAGNOSIS — R55 Syncope and collapse: Secondary | ICD-10-CM | POA: Diagnosis not present

## 2015-04-24 DIAGNOSIS — R9082 White matter disease, unspecified: Secondary | ICD-10-CM | POA: Diagnosis not present

## 2015-05-09 DIAGNOSIS — F17210 Nicotine dependence, cigarettes, uncomplicated: Secondary | ICD-10-CM | POA: Diagnosis not present

## 2015-05-09 DIAGNOSIS — R0602 Shortness of breath: Secondary | ICD-10-CM | POA: Diagnosis not present

## 2015-05-09 DIAGNOSIS — R55 Syncope and collapse: Secondary | ICD-10-CM | POA: Diagnosis not present

## 2015-05-09 DIAGNOSIS — I519 Heart disease, unspecified: Secondary | ICD-10-CM | POA: Diagnosis not present

## 2015-05-09 DIAGNOSIS — J449 Chronic obstructive pulmonary disease, unspecified: Secondary | ICD-10-CM | POA: Diagnosis not present

## 2015-05-09 DIAGNOSIS — I340 Nonrheumatic mitral (valve) insufficiency: Secondary | ICD-10-CM | POA: Diagnosis not present

## 2015-05-11 DIAGNOSIS — R55 Syncope and collapse: Secondary | ICD-10-CM | POA: Diagnosis not present

## 2015-05-16 DIAGNOSIS — N390 Urinary tract infection, site not specified: Secondary | ICD-10-CM | POA: Diagnosis not present

## 2015-05-16 DIAGNOSIS — R55 Syncope and collapse: Secondary | ICD-10-CM | POA: Diagnosis not present

## 2015-05-16 DIAGNOSIS — R0602 Shortness of breath: Secondary | ICD-10-CM | POA: Diagnosis not present

## 2015-05-23 DIAGNOSIS — R0602 Shortness of breath: Secondary | ICD-10-CM | POA: Diagnosis not present

## 2015-05-23 DIAGNOSIS — R55 Syncope and collapse: Secondary | ICD-10-CM | POA: Diagnosis not present

## 2015-05-23 DIAGNOSIS — I6523 Occlusion and stenosis of bilateral carotid arteries: Secondary | ICD-10-CM | POA: Diagnosis not present

## 2015-05-23 DIAGNOSIS — J449 Chronic obstructive pulmonary disease, unspecified: Secondary | ICD-10-CM | POA: Diagnosis not present

## 2015-05-23 DIAGNOSIS — I10 Essential (primary) hypertension: Secondary | ICD-10-CM | POA: Diagnosis not present

## 2015-06-08 DIAGNOSIS — N393 Stress incontinence (female) (male): Secondary | ICD-10-CM | POA: Diagnosis not present

## 2015-06-08 DIAGNOSIS — C679 Malignant neoplasm of bladder, unspecified: Secondary | ICD-10-CM | POA: Diagnosis not present

## 2015-06-12 DIAGNOSIS — M722 Plantar fascial fibromatosis: Secondary | ICD-10-CM | POA: Diagnosis not present

## 2015-06-12 DIAGNOSIS — M19072 Primary osteoarthritis, left ankle and foot: Secondary | ICD-10-CM | POA: Diagnosis not present

## 2015-06-12 DIAGNOSIS — R262 Difficulty in walking, not elsewhere classified: Secondary | ICD-10-CM | POA: Diagnosis not present

## 2015-06-12 DIAGNOSIS — Q664 Congenital talipes calcaneovalgus: Secondary | ICD-10-CM | POA: Diagnosis not present

## 2015-06-13 DIAGNOSIS — I10 Essential (primary) hypertension: Secondary | ICD-10-CM | POA: Diagnosis not present

## 2015-06-13 DIAGNOSIS — R55 Syncope and collapse: Secondary | ICD-10-CM | POA: Diagnosis not present

## 2015-06-14 DIAGNOSIS — Z8679 Personal history of other diseases of the circulatory system: Secondary | ICD-10-CM | POA: Diagnosis not present

## 2015-09-03 DIAGNOSIS — N3000 Acute cystitis without hematuria: Secondary | ICD-10-CM | POA: Diagnosis not present

## 2015-10-07 DIAGNOSIS — G8929 Other chronic pain: Secondary | ICD-10-CM | POA: Diagnosis not present

## 2015-10-07 DIAGNOSIS — Z23 Encounter for immunization: Secondary | ICD-10-CM | POA: Diagnosis not present

## 2015-10-07 DIAGNOSIS — M25511 Pain in right shoulder: Secondary | ICD-10-CM | POA: Diagnosis not present

## 2015-10-16 DIAGNOSIS — M25572 Pain in left ankle and joints of left foot: Secondary | ICD-10-CM | POA: Diagnosis not present

## 2015-10-16 DIAGNOSIS — K5090 Crohn's disease, unspecified, without complications: Secondary | ICD-10-CM | POA: Diagnosis not present

## 2015-10-16 DIAGNOSIS — F17200 Nicotine dependence, unspecified, uncomplicated: Secondary | ICD-10-CM | POA: Diagnosis not present

## 2015-10-16 DIAGNOSIS — I10 Essential (primary) hypertension: Secondary | ICD-10-CM | POA: Diagnosis not present

## 2015-10-16 DIAGNOSIS — Z85828 Personal history of other malignant neoplasm of skin: Secondary | ICD-10-CM | POA: Diagnosis not present

## 2015-10-16 DIAGNOSIS — J449 Chronic obstructive pulmonary disease, unspecified: Secondary | ICD-10-CM | POA: Diagnosis not present

## 2015-10-16 DIAGNOSIS — Z8551 Personal history of malignant neoplasm of bladder: Secondary | ICD-10-CM | POA: Diagnosis not present

## 2015-10-25 ENCOUNTER — Other Ambulatory Visit: Payer: Self-pay | Admitting: Acute Care

## 2015-10-25 DIAGNOSIS — F17210 Nicotine dependence, cigarettes, uncomplicated: Secondary | ICD-10-CM

## 2015-10-26 DIAGNOSIS — L570 Actinic keratosis: Secondary | ICD-10-CM | POA: Diagnosis not present

## 2015-10-26 DIAGNOSIS — L821 Other seborrheic keratosis: Secondary | ICD-10-CM | POA: Diagnosis not present

## 2015-10-26 DIAGNOSIS — Z85828 Personal history of other malignant neoplasm of skin: Secondary | ICD-10-CM | POA: Diagnosis not present

## 2015-10-26 DIAGNOSIS — X32XXXA Exposure to sunlight, initial encounter: Secondary | ICD-10-CM | POA: Diagnosis not present

## 2015-10-26 DIAGNOSIS — Z08 Encounter for follow-up examination after completed treatment for malignant neoplasm: Secondary | ICD-10-CM | POA: Diagnosis not present

## 2015-10-26 DIAGNOSIS — C44629 Squamous cell carcinoma of skin of left upper limb, including shoulder: Secondary | ICD-10-CM | POA: Diagnosis not present

## 2015-10-26 DIAGNOSIS — D225 Melanocytic nevi of trunk: Secondary | ICD-10-CM | POA: Diagnosis not present

## 2015-10-29 ENCOUNTER — Ambulatory Visit
Admission: RE | Admit: 2015-10-29 | Discharge: 2015-10-29 | Disposition: A | Discharge status: Home / Self Care | Payer: Medicare Other | Source: Ambulatory Visit | Attending: Acute Care | Admitting: Acute Care

## 2015-10-29 ENCOUNTER — Encounter: Payer: Self-pay | Admitting: Acute Care

## 2015-10-29 ENCOUNTER — Ambulatory Visit (INDEPENDENT_AMBULATORY_CARE_PROVIDER_SITE_OTHER): Payer: Medicare Other | Admitting: Acute Care

## 2015-10-29 DIAGNOSIS — F17210 Nicotine dependence, cigarettes, uncomplicated: Secondary | ICD-10-CM

## 2015-10-29 DIAGNOSIS — Z87891 Personal history of nicotine dependence: Secondary | ICD-10-CM | POA: Diagnosis not present

## 2015-10-29 NOTE — Progress Notes (Signed)
Shared Decision Making Visit Lung Cancer Screening Program [phone])   Eligibility:  Age 74 y.o.  Pack Years Smoking History Calculation 60 pack years (# packs/per year x # years smoked)  Recent History of coughing up blood  no  Unexplained weight loss? no ( >Than 15 pounds within the last 6 months )  Prior History Lung / other cancer No. ( Bladder cancer in 2006, no yearly scans.) (Diagnosis within the last 5 years already requiring surveillance chest CT Scans).  Smoking Status Current Smoker  Former Smokers: Years since quit:NA  Quit Date: NA  Visit Components:  Discussion included one or more decision making aids. yes  Discussion included risk/benefits of screening. yes  Discussion included potential follow up diagnostic testing for abnormal scans. yes  Discussion included meaning and risk of over diagnosis. yes  Discussion included meaning and risk of False Positives. yes  Discussion included meaning of total radiation exposure. yes  Counseling Included:  Importance of adherence to annual lung cancer LDCT screening. yes  Impact of comorbidities on ability to participate in the program. yes  Ability and willingness to under diagnostic treatment. yes  Smoking Cessation Counseling:  Current Smokers:   Discussed importance of smoking cessation. yes  Information about tobacco cessation classes and interventions provided to patient. yes  Patient provided with "ticket" for LDCT Scan. yes  Symptomatic Patient. no  Counseling: NA  Diagnosis Code: Tobacco Use Z72.0  Asymptomatic Patient yes  Counseling (Intermediate counseling: > three minutes counseling) [id number]  Former Smokers:   Discussed the importance of maintaining cigarette abstinence. Current smoker, see above  Diagnosis Code: Personal History of Nicotine Dependence. [id number]  Information about tobacco cessation classes and interventions provided to patient. Yes  Patient provided with "ticket" for  LDCT Scan. yes  Written Order for Lung Cancer Screening with LDCT placed in Epic. Yes (CT Chest Lung Cancer Screening Low Dose W/O CM) [id number] Z12.2-Screening of respiratory organs Z87.891-Personal history of nicotine dependence  I spent 15 minutes of face to face time with [name] discussing the risks and benefits of lung cancer screening. We viewed a power point together that highlighted the above noted topics in detail, pausing at intervals to allow for questions to be asked and answered, to ensure understanding. We discussed that the single most powerful action that she can take to decrease her risk of developing lung cancer is to stop smoking.She tried Wellbutrin in the past without success, and is interested in trying Chantix, but was told that at 74 years old she is too old to try it. I gave her the be stronger than your excuses card, with resources in the community and their contact information. We also discussed behavior modification, Quit Smart classes and nicotine replacement as options  to help her quit smoking. She has told me she will call me when she is ready to set a quit date, and I will help her in any way I can to meet the goal of becoming smoke free.We discussed the time and location of her scan, and that I will call within 24-48 hours of getting the results. She verbalized understanding of all of the above, and had no further questions upon leaving the office. I did give her a copy of the power point we viewed together to refer to as a resource, and told her to call me with any questions. She has my card and contact information.   Magdalen Spatz, NP

## 2015-10-31 ENCOUNTER — Telehealth: Payer: Self-pay | Admitting: Acute Care

## 2015-10-31 NOTE — Telephone Encounter (Signed)
I called Dr. Tawanna Cooler office at [phone] to let them know I had faxed the results of Ms. Marzo's LDCT scan to her.Her screening CT had an incidental finding of a 5.9 cm cyst along the posterior right upper kidney. As I do not have access to this patient's medical records, I do not know if this is a new finding. I want to make sure Dr. Dema Severin is aware. I called the office number which went over to their call center where I spoke with [facility]. I was told the office was having trouble with the phones today, and they told me to try again.I will try and call again in the am.

## 2015-11-01 ENCOUNTER — Telehealth: Payer: Self-pay | Admitting: Acute Care

## 2015-11-01 NOTE — Telephone Encounter (Signed)
I have sent a copy of the results of the LDCT to Dr. Orest Dikes office 10/31/15. Her lung screening was fine, Lung RADS 1, negative study. I called to make sure that Dr. Dema Severin was aware of a cyst measuring 5.9 cm on the patient's right kidney. I do not have access to this patient's records and did not know if this was a new or old finding. Dr. Orest Dikes staff will make sure she follows up as appropriate.

## 2015-11-05 DIAGNOSIS — N390 Urinary tract infection, site not specified: Secondary | ICD-10-CM | POA: Diagnosis not present

## 2015-11-15 DIAGNOSIS — R740 Nonspecific elevation of levels of transaminase and lactic acid dehydrogenase [LDH]: Secondary | ICD-10-CM | POA: Diagnosis not present

## 2015-11-20 DIAGNOSIS — K5090 Crohn's disease, unspecified, without complications: Secondary | ICD-10-CM | POA: Diagnosis not present

## 2015-11-27 DIAGNOSIS — L570 Actinic keratosis: Secondary | ICD-10-CM | POA: Diagnosis not present

## 2015-11-27 DIAGNOSIS — X32XXXD Exposure to sunlight, subsequent encounter: Secondary | ICD-10-CM | POA: Diagnosis not present

## 2015-11-27 DIAGNOSIS — Z08 Encounter for follow-up examination after completed treatment for malignant neoplasm: Secondary | ICD-10-CM | POA: Diagnosis not present

## 2015-11-27 DIAGNOSIS — Z85828 Personal history of other malignant neoplasm of skin: Secondary | ICD-10-CM | POA: Diagnosis not present

## 2015-12-11 DIAGNOSIS — C675 Malignant neoplasm of bladder neck: Secondary | ICD-10-CM | POA: Diagnosis not present

## 2015-12-11 DIAGNOSIS — N3941 Urge incontinence: Secondary | ICD-10-CM | POA: Diagnosis not present

## 2015-12-11 DIAGNOSIS — N816 Rectocele: Secondary | ICD-10-CM | POA: Diagnosis not present

## 2016-02-22 DIAGNOSIS — M79672 Pain in left foot: Secondary | ICD-10-CM | POA: Diagnosis not present

## 2016-02-22 DIAGNOSIS — Q828 Other specified congenital malformations of skin: Secondary | ICD-10-CM | POA: Diagnosis not present

## 2016-02-22 DIAGNOSIS — Z969 Presence of functional implant, unspecified: Secondary | ICD-10-CM | POA: Diagnosis not present

## 2016-03-05 ENCOUNTER — Other Ambulatory Visit: Payer: Self-pay | Admitting: Acute Care

## 2016-03-05 DIAGNOSIS — F17210 Nicotine dependence, cigarettes, uncomplicated: Secondary | ICD-10-CM

## 2016-04-14 DIAGNOSIS — F439 Reaction to severe stress, unspecified: Secondary | ICD-10-CM | POA: Diagnosis not present

## 2016-04-14 DIAGNOSIS — I129 Hypertensive chronic kidney disease with stage 1 through stage 4 chronic kidney disease, or unspecified chronic kidney disease: Secondary | ICD-10-CM | POA: Diagnosis not present

## 2016-04-14 DIAGNOSIS — J449 Chronic obstructive pulmonary disease, unspecified: Secondary | ICD-10-CM | POA: Diagnosis not present

## 2016-04-14 DIAGNOSIS — F17200 Nicotine dependence, unspecified, uncomplicated: Secondary | ICD-10-CM | POA: Diagnosis not present

## 2016-04-14 DIAGNOSIS — N183 Chronic kidney disease, stage 3 (moderate): Secondary | ICD-10-CM | POA: Diagnosis not present

## 2016-04-16 ENCOUNTER — Encounter (HOSPITAL_BASED_OUTPATIENT_CLINIC_OR_DEPARTMENT_OTHER): Payer: Self-pay | Admitting: *Deleted

## 2016-04-16 ENCOUNTER — Other Ambulatory Visit: Payer: Self-pay | Admitting: Orthopedic Surgery

## 2016-04-18 ENCOUNTER — Other Ambulatory Visit: Payer: Self-pay

## 2016-04-18 ENCOUNTER — Encounter (HOSPITAL_BASED_OUTPATIENT_CLINIC_OR_DEPARTMENT_OTHER)
Admission: RE | Admit: 2016-04-18 | Discharge: 2016-04-18 | Disposition: A | Discharge status: Home / Self Care | Payer: Medicare Other | Source: Ambulatory Visit | Attending: Orthopedic Surgery | Admitting: Orthopedic Surgery

## 2016-04-18 DIAGNOSIS — I10 Essential (primary) hypertension: Secondary | ICD-10-CM | POA: Diagnosis not present

## 2016-04-18 DIAGNOSIS — F17210 Nicotine dependence, cigarettes, uncomplicated: Secondary | ICD-10-CM | POA: Diagnosis not present

## 2016-04-18 DIAGNOSIS — Z01810 Encounter for preprocedural cardiovascular examination: Secondary | ICD-10-CM | POA: Insufficient documentation

## 2016-04-18 DIAGNOSIS — J449 Chronic obstructive pulmonary disease, unspecified: Secondary | ICD-10-CM | POA: Diagnosis not present

## 2016-04-18 DIAGNOSIS — T84293A Other mechanical complication of internal fixation device of bones of foot and toes, initial encounter: Secondary | ICD-10-CM | POA: Diagnosis not present

## 2016-04-18 NOTE — Pre-Procedure Instructions (Signed)
Pre op EKG reviewed by Dr. Imogene Burn. Requested past records from [place]. Cardiology and other medical records received and reviewed by Dr. Jillyn Hidden ok to continue as scheduled for sx 04/24/2016. Records placed in chart.

## 2016-04-24 ENCOUNTER — Encounter (HOSPITAL_BASED_OUTPATIENT_CLINIC_OR_DEPARTMENT_OTHER): Payer: Self-pay | Admitting: Certified Registered"

## 2016-04-24 ENCOUNTER — Encounter (HOSPITAL_BASED_OUTPATIENT_CLINIC_OR_DEPARTMENT_OTHER)
Admission: RE | Disposition: A | Discharge status: Home / Self Care | Payer: Self-pay | Source: Ambulatory Visit | Attending: Orthopedic Surgery

## 2016-04-24 ENCOUNTER — Ambulatory Visit (HOSPITAL_BASED_OUTPATIENT_CLINIC_OR_DEPARTMENT_OTHER): Payer: Medicare Other | Admitting: Anesthesiology

## 2016-04-24 ENCOUNTER — Ambulatory Visit (HOSPITAL_BASED_OUTPATIENT_CLINIC_OR_DEPARTMENT_OTHER)
Admission: RE | Admit: 2016-04-24 | Discharge: 2016-04-24 | Disposition: A | Discharge status: Home / Self Care | Payer: Medicare Other | Source: Ambulatory Visit | Attending: Orthopedic Surgery | Admitting: Orthopedic Surgery

## 2016-04-24 DIAGNOSIS — J449 Chronic obstructive pulmonary disease, unspecified: Secondary | ICD-10-CM | POA: Diagnosis not present

## 2016-04-24 DIAGNOSIS — T84293A Other mechanical complication of internal fixation device of bones of foot and toes, initial encounter: Secondary | ICD-10-CM | POA: Diagnosis not present

## 2016-04-24 DIAGNOSIS — Z981 Arthrodesis status: Secondary | ICD-10-CM | POA: Insufficient documentation

## 2016-04-24 DIAGNOSIS — Z96659 Presence of unspecified artificial knee joint: Secondary | ICD-10-CM | POA: Diagnosis not present

## 2016-04-24 DIAGNOSIS — I10 Essential (primary) hypertension: Secondary | ICD-10-CM | POA: Diagnosis not present

## 2016-04-24 DIAGNOSIS — Z79899 Other long term (current) drug therapy: Secondary | ICD-10-CM | POA: Diagnosis not present

## 2016-04-24 DIAGNOSIS — Z8551 Personal history of malignant neoplasm of bladder: Secondary | ICD-10-CM | POA: Diagnosis not present

## 2016-04-24 DIAGNOSIS — T8484XA Pain due to internal orthopedic prosthetic devices, implants and grafts, initial encounter: Secondary | ICD-10-CM | POA: Diagnosis not present

## 2016-04-24 DIAGNOSIS — Z9049 Acquired absence of other specified parts of digestive tract: Secondary | ICD-10-CM | POA: Diagnosis not present

## 2016-04-24 DIAGNOSIS — T8489XA Other specified complication of internal orthopedic prosthetic devices, implants and grafts, initial encounter: Secondary | ICD-10-CM | POA: Diagnosis not present

## 2016-04-24 DIAGNOSIS — Y838 Other surgical procedures as the cause of abnormal reaction of the patient, or of later complication, without mention of misadventure at the time of the procedure: Secondary | ICD-10-CM | POA: Diagnosis not present

## 2016-04-24 DIAGNOSIS — Z7982 Long term (current) use of aspirin: Secondary | ICD-10-CM | POA: Insufficient documentation

## 2016-04-24 DIAGNOSIS — F17210 Nicotine dependence, cigarettes, uncomplicated: Secondary | ICD-10-CM | POA: Insufficient documentation

## 2016-04-24 HISTORY — DX: Essential (primary) hypertension: I10

## 2016-04-24 HISTORY — DX: Malignant (primary) neoplasm, unspecified: C80.1

## 2016-04-24 HISTORY — DX: Overactive bladder: N32.81

## 2016-04-24 HISTORY — PX: HARDWARE REMOVAL: SHX979

## 2016-04-24 HISTORY — DX: Unspecified osteoarthritis, unspecified site: M19.90

## 2016-04-24 HISTORY — DX: Chronic obstructive pulmonary disease, unspecified: J44.9

## 2016-04-24 SURGERY — REMOVAL, HARDWARE
Anesthesia: General | Site: Foot | Laterality: Left

## 2016-04-24 MED ORDER — FENTANYL CITRATE (PF) 100 MCG/2ML IJ SOLN
50.0000 ug | INTRAMUSCULAR | Status: DC | PRN
  Administered 2016-04-24 (×2): 50 ug via INTRAVENOUS

## 2016-04-24 MED ORDER — EPHEDRINE SULFATE 50 MG/ML IJ SOLN
INTRAMUSCULAR | Status: DC | PRN
  Administered 2016-04-24: 10 mg via INTRAVENOUS

## 2016-04-24 MED ORDER — DEXAMETHASONE SODIUM PHOSPHATE 4 MG/ML IJ SOLN
INTRAMUSCULAR | Status: DC | PRN
  Administered 2016-04-24: 10 mg via INTRAVENOUS

## 2016-04-24 MED ORDER — LIDOCAINE 2% (20 MG/ML) 5 ML SYRINGE
INTRAMUSCULAR | Status: AC
  Filled 2016-04-24: qty 5

## 2016-04-24 MED ORDER — 0.9 % SODIUM CHLORIDE (POUR BTL) OPTIME
TOPICAL | Status: DC | PRN
  Administered 2016-04-24: 1000 mL

## 2016-04-24 MED ORDER — MIDAZOLAM HCL 2 MG/2ML IJ SOLN
INTRAMUSCULAR | Status: AC
  Filled 2016-04-24: qty 2

## 2016-04-24 MED ORDER — ONDANSETRON HCL 4 MG/2ML IJ SOLN
INTRAMUSCULAR | Status: DC | PRN
  Administered 2016-04-24: 4 mg via INTRAVENOUS

## 2016-04-24 MED ORDER — PROPOFOL 10 MG/ML IV BOLUS
INTRAVENOUS | Status: DC | PRN
  Administered 2016-04-24: 50 mg via INTRAVENOUS
  Administered 2016-04-24: 150 mg via INTRAVENOUS

## 2016-04-24 MED ORDER — GLYCOPYRROLATE 0.2 MG/ML IJ SOLN: 0.2000 mg | Freq: Once | INTRAMUSCULAR | Status: DC | PRN

## 2016-04-24 MED ORDER — OXYCODONE HCL 5 MG PO TABS
5.0000 mg | ORAL_TABLET | Freq: Once | ORAL | Status: AC | PRN
  Administered 2016-04-24: 5 mg via ORAL

## 2016-04-24 MED ORDER — SCOPOLAMINE 1 MG/3DAYS TD PT72: 1.0000 | MEDICATED_PATCH | Freq: Once | TRANSDERMAL | Status: DC | PRN

## 2016-04-24 MED ORDER — BUPIVACAINE-EPINEPHRINE 0.5% -1:200000 IJ SOLN
INTRAMUSCULAR | Status: DC | PRN
  Administered 2016-04-24: 10 mL

## 2016-04-24 MED ORDER — OXYCODONE HCL 5 MG PO TABS
ORAL_TABLET | ORAL | Status: AC
  Filled 2016-04-24: qty 1

## 2016-04-24 MED ORDER — OXYCODONE HCL 5 MG PO TABS: 5.0000 mg | ORAL_TABLET | ORAL | Status: DC | PRN

## 2016-04-24 MED ORDER — CEFAZOLIN SODIUM-DEXTROSE 2-4 GM/100ML-% IV SOLN
2.0000 g | INTRAVENOUS | Status: AC
  Administered 2016-04-24: 2 g via INTRAVENOUS

## 2016-04-24 MED ORDER — CHLORHEXIDINE GLUCONATE 4 % EX LIQD: 60.0000 mL | Freq: Once | CUTANEOUS | Status: DC

## 2016-04-24 MED ORDER — PROMETHAZINE HCL 25 MG/ML IJ SOLN: 6.2500 mg | INTRAMUSCULAR | Status: DC | PRN

## 2016-04-24 MED ORDER — LIDOCAINE HCL (CARDIAC) 20 MG/ML IV SOLN
INTRAVENOUS | Status: DC | PRN
  Administered 2016-04-24: 50 mg via INTRAVENOUS

## 2016-04-24 MED ORDER — CEFAZOLIN SODIUM-DEXTROSE 2-4 GM/100ML-% IV SOLN
INTRAVENOUS | Status: AC
  Filled 2016-04-24: qty 100

## 2016-04-24 MED ORDER — FENTANYL CITRATE (PF) 100 MCG/2ML IJ SOLN: 25.0000 ug | INTRAMUSCULAR | Status: DC | PRN

## 2016-04-24 MED ORDER — SODIUM CHLORIDE 0.9 % IV SOLN: INTRAVENOUS | Status: DC

## 2016-04-24 MED ORDER — FENTANYL CITRATE (PF) 100 MCG/2ML IJ SOLN
INTRAMUSCULAR | Status: AC
  Filled 2016-04-24: qty 2

## 2016-04-24 MED ORDER — MIDAZOLAM HCL 2 MG/2ML IJ SOLN
1.0000 mg | INTRAMUSCULAR | Status: DC | PRN
  Administered 2016-04-24 (×2): 1 mg via INTRAVENOUS

## 2016-04-24 MED ORDER — PROPOFOL 10 MG/ML IV BOLUS
INTRAVENOUS | Status: AC
  Filled 2016-04-24: qty 20

## 2016-04-24 MED ORDER — ONDANSETRON HCL 4 MG/2ML IJ SOLN
INTRAMUSCULAR | Status: AC
  Filled 2016-04-24: qty 2

## 2016-04-24 MED ORDER — LACTATED RINGERS IV SOLN
INTRAVENOUS | Status: DC
  Administered 2016-04-24: 12:00:00 via INTRAVENOUS

## 2016-04-24 SURGICAL SUPPLY — 62 items
BANDAGE ACE 4X5 VEL STRL LF (GAUZE/BANDAGES/DRESSINGS) ×2 IMPLANT
BANDAGE ESMARK 6X9 LF (GAUZE/BANDAGES/DRESSINGS) IMPLANT
BENZOIN TINCTURE PRP APPL 2/3 (GAUZE/BANDAGES/DRESSINGS) IMPLANT
BLADE SURG 15 STRL LF DISP TIS (BLADE) ×2 IMPLANT
BLADE SURG 15 STRL SS (BLADE) ×2
BNDG COHESIVE 4X5 TAN STRL (GAUZE/BANDAGES/DRESSINGS) ×2 IMPLANT
BNDG COHESIVE 6X5 TAN STRL LF (GAUZE/BANDAGES/DRESSINGS) IMPLANT
BNDG ESMARK 4X9 LF (GAUZE/BANDAGES/DRESSINGS) ×2 IMPLANT
BNDG ESMARK 6X9 LF (GAUZE/BANDAGES/DRESSINGS)
BOOT STEPPER DURA MED (SOFTGOODS) ×2 IMPLANT
CHLORAPREP W/TINT 26ML (MISCELLANEOUS) ×2 IMPLANT
COVER BACK TABLE 60X90IN (DRAPES) ×2 IMPLANT
CUFF TOURNIQUET SINGLE 24IN (TOURNIQUET CUFF) IMPLANT
DECANTER SPIKE VIAL GLASS SM (MISCELLANEOUS) IMPLANT
DRAPE EXTREMITY T 121X128X90 (DRAPE) ×2 IMPLANT
DRAPE OEC MINIVIEW 54X84 (DRAPES) IMPLANT
DRAPE SURG 17X23 STRL (DRAPES) IMPLANT
DRAPE U-SHAPE 47X51 STRL (DRAPES) ×2 IMPLANT
DRSG MEPITEL 4X7.2 (GAUZE/BANDAGES/DRESSINGS) ×2 IMPLANT
DRSG PAD ABDOMINAL 8X10 ST (GAUZE/BANDAGES/DRESSINGS) ×2 IMPLANT
ELECT REM PT RETURN 9FT ADLT (ELECTROSURGICAL) ×2
ELECTRODE REM PT RTRN 9FT ADLT (ELECTROSURGICAL) ×1 IMPLANT
GAUZE SPONGE 4X4 12PLY STRL (GAUZE/BANDAGES/DRESSINGS) ×2 IMPLANT
GLOVE BIO SURGEON STRL SZ8 (GLOVE) ×2 IMPLANT
GLOVE BIOGEL PI IND STRL 8 (GLOVE) ×2 IMPLANT
GLOVE BIOGEL PI INDICATOR 8 (GLOVE) ×2
GLOVE ECLIPSE 7.5 STRL STRAW (GLOVE) ×2 IMPLANT
GLOVE EXAM NITRILE MD LF STRL (GLOVE) IMPLANT
GOWN STRL REUS W/ TWL LRG LVL3 (GOWN DISPOSABLE) ×1 IMPLANT
GOWN STRL REUS W/ TWL XL LVL3 (GOWN DISPOSABLE) ×2 IMPLANT
GOWN STRL REUS W/TWL LRG LVL3 (GOWN DISPOSABLE) ×1
GOWN STRL REUS W/TWL XL LVL3 (GOWN DISPOSABLE) ×2
NEEDLE HYPO 22GX1.5 SAFETY (NEEDLE) ×2 IMPLANT
PACK BASIN DAY SURGERY FS (CUSTOM PROCEDURE TRAY) ×2 IMPLANT
PAD CAST 4YDX4 CTTN HI CHSV (CAST SUPPLIES) ×1 IMPLANT
PADDING CAST ABS 4INX4YD NS (CAST SUPPLIES) ×1
PADDING CAST ABS COTTON 4X4 ST (CAST SUPPLIES) ×1 IMPLANT
PADDING CAST COTTON 4X4 STRL (CAST SUPPLIES) ×1
PADDING CAST COTTON 6X4 STRL (CAST SUPPLIES) IMPLANT
PENCIL BUTTON HOLSTER BLD 10FT (ELECTRODE) ×2 IMPLANT
SANITIZER HAND PURELL 535ML FO (MISCELLANEOUS) ×2 IMPLANT
SHEET MEDIUM DRAPE 40X70 STRL (DRAPES) ×2 IMPLANT
SLEEVE SCD COMPRESS KNEE MED (MISCELLANEOUS) ×2 IMPLANT
SPLINT FAST PLASTER 5X30 (CAST SUPPLIES)
SPLINT PLASTER CAST FAST 5X30 (CAST SUPPLIES) IMPLANT
SPONGE GAUZE 4X4 12PLY STER LF (GAUZE/BANDAGES/DRESSINGS) ×2 IMPLANT
SPONGE LAP 18X18 X RAY DECT (DISPOSABLE) ×2 IMPLANT
STOCKINETTE 6  STRL (DRAPES) ×1
STOCKINETTE 6 STRL (DRAPES) ×1 IMPLANT
STRIP CLOSURE SKIN 1/2X4 (GAUZE/BANDAGES/DRESSINGS) IMPLANT
SUCTION FRAZIER HANDLE 10FR (MISCELLANEOUS) ×1
SUCTION TUBE FRAZIER 10FR DISP (MISCELLANEOUS) ×1 IMPLANT
SUT ETHILON 3 0 PS 1 (SUTURE) ×2 IMPLANT
SUT MNCRL AB 3-0 PS2 18 (SUTURE) ×2 IMPLANT
SUT VIC AB 0 SH 27 (SUTURE) IMPLANT
SUT VIC AB 2-0 SH 27 (SUTURE)
SUT VIC AB 2-0 SH 27XBRD (SUTURE) IMPLANT
SYR BULB 3OZ (MISCELLANEOUS) ×2 IMPLANT
SYR CONTROL 10ML LL (SYRINGE) ×2 IMPLANT
TOWEL OR 17X24 6PK STRL BLUE (TOWEL DISPOSABLE) ×2 IMPLANT
TUBE CONNECTING 20X1/4 (TUBING) ×2 IMPLANT
UNDERPAD 30X30 (UNDERPADS AND DIAPERS) ×2 IMPLANT

## 2016-04-24 NOTE — Discharge Instructions (Signed)
Ana Simmer, MD [place]  Please read the following information regarding your care after surgery.  Medications  You only need a prescription for the narcotic pain medicine (ex. oxycodone, Percocet, Norco).  All of the other medicines listed below are available over the counter. X acetominophen (Tylenol) 650 mg every 4-6 hours as you need for minor pain X oxycodone as prescribed for moderate to severe pain ?   Narcotic pain medicine (ex. oxycodone, Percocet, Vicodin) will cause constipation.  To prevent this problem, take the following medicines while you are taking any pain medicine. X docusate sodium (Colace) 100 mg twice a day X senna (Senokot) 2 tablets twice a day  Weight Bearing X Bear weight when you are able on your operated leg or foot in the cam boot.  You may remove the boot to sleep. ? Bear weight only on the heel of your operated foot in the post-op shoe. ? Do not bear any weight on the operated leg or foot.  Cast / Splint / Dressing X Keep your splint or cast clean and dry.  Dont put anything (coat hanger, pencil, etc) down inside of it.  If it gets damp, use a hair dryer on the cool setting to dry it.  If it gets soaked, call the office to schedule an appointment for a cast change. ? Remove your dressing 3 days after surgery and cover the incisions with dry dressings.    After your dressing, cast or splint is removed; you may shower, but do not soak or scrub the wound.  Allow the water to run over it, and then gently pat it dry.  Swelling It is normal for you to have swelling where you had surgery.  To reduce swelling and pain, keep your toes above your nose for at least 3 days after surgery.  It may be necessary to keep your foot or leg elevated for several weeks.  If it hurts, it should be elevated.  Follow Up Call my office at [phone] when you are discharged from the hospital or surgery center to schedule an appointment to be seen two weeks after  surgery.  Call my office at [phone] if you develop a fever >101.5 F, nausea, vomiting, bleeding from the surgical site or severe pain.       Post Anesthesia Home Care Instructions  Activity: Get plenty of rest for the remainder of the day. A responsible adult should stay with you for 24 hours following the procedure.  For the next 24 hours, DO NOT: -Drive a car -Paediatric nurse -Drink alcoholic beverages -Take any medication unless instructed by your physician -Make any legal decisions or sign important papers.  Meals: Start with liquid foods such as gelatin or soup. Progress to regular foods as tolerated. Avoid greasy, spicy, heavy foods. If nausea and/or vomiting occur, drink only clear liquids until the nausea and/or vomiting subsides. Call your physician if vomiting continues.  Special Instructions/Symptoms: Your throat may feel dry or sore from the anesthesia or the breathing tube placed in your throat during surgery. If this causes discomfort, gargle with warm salt water. The discomfort should disappear within 24 hours.  If you had a scopolamine patch placed behind your ear for the management of post- operative nausea and/or vomiting:  1. The medication in the patch is effective for 72 hours, after which it should be removed.  Wrap patch in a tissue and discard in the trash. Wash hands thoroughly with soap and water. 2. You may remove the  patch earlier than 72 hours if you experience unpleasant side effects which may include dry mouth, dizziness or visual disturbances. 3. Avoid touching the patch. Wash your hands with soap and water after contact with the patch.

## 2016-04-24 NOTE — Anesthesia Postprocedure Evaluation (Signed)
Anesthesia Post Note  Patient: Essica Jaiswal  Procedure(s) Performed: Procedure(s) (LRB): REMOVAL OF DEEP IMPLANT LEFT FOOT (Left)  Patient location during evaluation: PACU Anesthesia Type: General Level of consciousness: awake and alert Pain management: pain level controlled Vital Signs Assessment: post-procedure vital signs reviewed and stable Respiratory status: spontaneous breathing, nonlabored ventilation, respiratory function stable and patient connected to nasal cannula oxygen Cardiovascular status: blood pressure returned to baseline and stable Postop Assessment: no signs of nausea or vomiting Anesthetic complications: no    Last Vitals:  Filed Vitals:   04/24/16 1450 04/24/16 1500  BP: 92/59   Pulse: 80 78  Temp: 36.6 C   Resp: 12 11    Last Pain:  Filed Vitals:   04/24/16 1500  PainSc: 0-No pain                 Kingslee Dowse S

## 2016-04-24 NOTE — Anesthesia Preprocedure Evaluation (Addendum)
Anesthesia Evaluation  Patient identified by MRN, date of birth, ID band Patient awake    Reviewed: Allergy & Precautions, NPO status , Patient's Chart, lab work & pertinent test results  Airway Mallampati: II  TM Distance: >3 FB Neck ROM: Full    Dental no notable dental hx.    Pulmonary neg pulmonary ROS, COPD, Current Smoker,    Pulmonary exam normal breath sounds clear to auscultation       Cardiovascular hypertension, Pt. on medications negative cardio ROS Normal cardiovascular exam Rhythm:Regular Rate:Normal     Neuro/Psych negative neurological ROS  negative psych ROS   GI/Hepatic negative GI ROS, Neg liver ROS,   Endo/Other  negative endocrine ROS  Renal/GU negative Renal ROS  negative genitourinary   Musculoskeletal negative musculoskeletal ROS (+)   Abdominal   Peds negative pediatric ROS (+)  Hematology negative hematology ROS (+)   Anesthesia Other Findings   Reproductive/Obstetrics negative OB ROS                            Anesthesia Physical Anesthesia Plan  ASA: III  Anesthesia Plan: General   Post-op Pain Management:    Induction: Intravenous  Airway Management Planned: LMA  Additional Equipment:   Intra-op Plan:   Post-operative Plan: Extubation in OR  Informed Consent: I have reviewed the patients History and Physical, chart, labs and discussed the procedure including the risks, benefits and alternatives for the proposed anesthesia with the patient or authorized representative who has indicated his/her understanding and acceptance.   Dental advisory given  Plan Discussed with: CRNA and Surgeon  Anesthesia Plan Comments: (GA backup)       Anesthesia Quick Evaluation

## 2016-04-24 NOTE — Transfer of Care (Signed)
Immediate Anesthesia Transfer of Care Note  Patient: [name]  Procedure(s) Performed: Procedure(s) with comments: REMOVAL OF DEEP IMPLANT LEFT FOOT (Left) - ANESTHESIA:  REGIONAL/MONITOR ANESTHESIA  Patient Location: PACU  Anesthesia Type:General  Level of Consciousness: sedated  Airway & Oxygen Therapy: Patient Spontanous Breathing and Patient connected to face mask oxygen  Post-op Assessment: Report given to RN and Post -op Vital signs reviewed and stable  Post vital signs: Reviewed and stable  Last Vitals:  Filed Vitals:   04/24/16 1122  BP: 109/71  Pulse: 60  Temp: 36.8 C  Resp: 18    Last Pain:  Filed Vitals:   04/24/16 1124  PainSc: 2          Complications: No apparent anesthesia complications

## 2016-04-24 NOTE — Brief Op Note (Signed)
04/24/2016  2:47 PM  PATIENT:  [name]  75 y.o. female  PRE-OPERATIVE DIAGNOSIS:  LEFT FOOT HARDWARE FAILURE  POST-OPERATIVE DIAGNOSIS:  LEFT FOOT HARDWARE FAILURE  Procedure(s): REMOVAL OF DEEP IMPLANT LEFT FOOT AP and lateral xrays of the left foot  SURGEON:  Wylene Simmer, MD  ASSISTANT: n/a  ANESTHESIA:   General  EBL:  minimal   TOURNIQUET:  approx 45 min with ankle esmarch  COMPLICATIONS:  None apparent  DISPOSITION:  Extubated, awake and stable to recovery.  DICTATION ID:  [id number]

## 2016-04-24 NOTE — Anesthesia Procedure Notes (Signed)
Procedure Name: LMA Insertion Date/Time: 04/24/2016 1:41 PM Performed by: Melynda Ripple D Pre-anesthesia Checklist: Patient identified, Emergency Drugs available, Suction available and Patient being monitored Patient Re-evaluated:Patient Re-evaluated prior to inductionOxygen Delivery Method: Circle System Utilized Preoxygenation: Pre-oxygenation with 100% oxygen Intubation Type: IV induction Ventilation: Mask ventilation without difficulty LMA: LMA inserted LMA Size: 4.0 Number of attempts: 1 Airway Equipment and Method: Bite block Placement Confirmation: positive ETCO2 Tube secured with: Tape Dental Injury: Teeth and Oropharynx as per pre-operative assessment

## 2016-04-24 NOTE — H&P (Signed)
[name] is an 75 y.o. female.   Chief Complaint: left foot pain HPI: 75 y/o female with left foot painful hardware after talonavicular arthrodesis.  She presents now for removal of the deep implats from the dorsum of the foot.   Past Medical History  Diagnosis Date  . Hypertension   . COPD (chronic obstructive pulmonary disease) ([place])   . OAB (overactive bladder)   . Arthritis   . Cancer [facility]) 2004    bladder     Past Surgical History  Procedure Laterality Date  . Cholecystectomy    . Tubal ligation    . Fracture surgery    . Bladder surgery    . Joint replacement      knee  . Throat polyps      History reviewed. No pertinent family history. Social History:  reports that she has been smoking Cigarettes.  She has a 60 pack-year smoking history. She does not have any smokeless tobacco history on file. She reports that she does not drink alcohol or use illicit drugs.  Allergies: No Known Allergies  Medications Prior to Admission  Medication Sig Dispense Refill  . aspirin 81 MG tablet Take 81 mg by mouth daily.    . folic acid (FOLVITE) [id number] MCG tablet Take 400 mcg by mouth daily.    [name] lisinopril (PRINIVIL,ZESTRIL) 5 MG tablet Take 5 mg by mouth daily.    . mirabegron ER (MYRBETRIQ) 50 MG TB24 tablet Take 50 mg by mouth daily.    . Multiple Vitamins-Minerals (CENTRUM ULTRA WOMENS PO) Take by mouth.    . tiotropium (SPIRIVA) 18 MCG inhalation capsule Place 18 mcg into inhaler and inhale daily.      No results found for this or any previous visit (from the past 48 hour(s)). No results found.  ROS  No recent f/c/nv/wt loss  Blood pressure 109/71, pulse 60, temperature 98.2 F (36.8 C), temperature source Oral, resp. rate 18, height 5\' 4"  (1.626 m), weight 75.014 kg (165 lb 6 oz), SpO2 96 %. Physical Exam  wn wd woman in nad.  A and O x 4.  Mood and affect normal.  EOMI.  resp unlabored.  L foot with healed incision dorsally.  Skin o/w healthy and intact.  No  lymphadenopathy.  5/5 strength in PF adn DF of the ankle and toes.  Sens to LT intact at the dorsal foot.  Assessment/Plan L dorsal foot painful hardware - to OR for removal of the deep implants.  The risks and benefits of the alternative treatment options have been discussed in detail.  The patient wishes to proceed with surgery and specifically understands risks of bleeding, infection, nerve damage, blood clots, need for additional surgery, amputation and death.   Wylene Simmer, MD 2016/05/21, 1:28 PM

## 2016-04-25 ENCOUNTER — Encounter (HOSPITAL_BASED_OUTPATIENT_CLINIC_OR_DEPARTMENT_OTHER): Payer: Self-pay | Admitting: Orthopedic Surgery

## 2016-04-25 NOTE — Op Note (Signed)
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  PROCEDURE: 1. Removal of deep implants from the left foot. 2. AP and lateral radiographs of the left foot.  SURGEON:  Wylene Simmer, MD.  ANESTHESIA:  General.  ESTIMATED BLOOD LOSS:  Minimal.  TOURNIQUET TIME:  Approximately 45 minutes with an ankle Esmarch.  COMPLICATIONS:  None apparent.  DISPOSITION:  Extubated, awake, and stable to recovery.  INDICATION FOR PROCEDURE:  The patient is a 75 year old woman, who underwent talonavicular joint arthrodesis several years ago while residing in [place].  She presented to my office with complaint of pain at her hindfoot.  Radiographs showed a healed talonavicular fusion with hardware in place across the dorsal aspect of the joint.  Two of screws crossed the subtalar joint into the calcaneus and both of those screws were broken.  She presents today for removal of this painful hardware. She understands the risks and benefits, the alternative treatment options, and elects surgical treatment.  She specifically understands risks of bleeding, infection, nerve damage, blood clots, need for additional surgery, continued pain, amputation, and death.  PROCEDURE IN DETAIL:  After preoperative consent was obtained and the correct operative site was identified, the patient was brought to the operating room and placed supine on the operating table.  General anesthesia was induced.  Preoperative antibiotics were administered. Surgical time-out was taken.  Left  lower extremity was prepped and draped in standard sterile fashion.  Foot was exsanguinated and a 4-inch Esmarch tourniquet was wrapped around the ankle.  The patient's previous dorsal incision was identified.  The incision was again opened sharply and dissection was carried down through the skin and subcutaneous tissue.  The tendons and neurovascular bundle were protected.  The plate was identified.  Dissection was carried superficially over the plate to expose all 4 screw heads.  The lock washers were then removed without difficulty.  All 4 screws were removed followed by the plate.  The 2 proximal screws across the subtalar joint were broken and their superficial pieces were removed.  The deep piece was identified at the base of the screw hole.  A trephine was then used to pass over the screw loosening it appropriately.  The screw was then removed in its entirety. The same process was repeated for the 2nd screw, and it was removed in its entirety as well.  AP and lateral radiographs confirmed appropriate removal of all dorsal hardware.  The wound was irrigated copiously. Deep subcutaneous tissues were approximated with 2-0 Vicryl, and the skin incision was closed with a running 3-0 nylon.  Sterile dressings were applied, followed by a CAM walker boot.  Tourniquet was released after application of the dressings  at approximately 45 minutes.  0.5% Marcaine with epinephrine was infiltrated into the skin and subcutaneous tissues for postoperative pain control.  The patient was then awakened from anesthesia and transported to recovery room in stable condition.  FOLLOWUP PLAN:  The patient will be weightbearing as tolerated on her left foot.  She is going to follow up with me in the office in 2 weeks for suture removal.  RADIOGRAPHS:  AP and lateral radiographs of the left foot were obtained intraoperatively.  These show interval removal of hardware from across the talonavicular joint  including 2 broken screws that were across the subtalar joint.  No other acute injuries are noted.  The calcaneal hardware is appropriately positioned with no evidence of hardware failure.     Wylene Simmer, MD     JH/MEDQ  D:  04/24/2016  T:  04/25/2016  Job:  [id number]

## 2016-05-08 DIAGNOSIS — Z969 Presence of functional implant, unspecified: Secondary | ICD-10-CM | POA: Diagnosis not present

## 2016-05-08 DIAGNOSIS — Z9889 Other specified postprocedural states: Secondary | ICD-10-CM | POA: Diagnosis not present

## 2016-05-21 DIAGNOSIS — Z9889 Other specified postprocedural states: Secondary | ICD-10-CM | POA: Diagnosis not present

## 2016-05-21 DIAGNOSIS — Z969 Presence of functional implant, unspecified: Secondary | ICD-10-CM | POA: Diagnosis not present

## 2016-05-28 DIAGNOSIS — Z4789 Encounter for other orthopedic aftercare: Secondary | ICD-10-CM | POA: Diagnosis not present

## 2016-06-10 DIAGNOSIS — C670 Malignant neoplasm of trigone of bladder: Secondary | ICD-10-CM | POA: Diagnosis not present

## 2016-06-18 DIAGNOSIS — Z9889 Other specified postprocedural states: Secondary | ICD-10-CM | POA: Diagnosis not present

## 2016-06-18 DIAGNOSIS — T8484XD Pain due to internal orthopedic prosthetic devices, implants and grafts, subsequent encounter: Secondary | ICD-10-CM | POA: Diagnosis not present

## 2016-06-28 DIAGNOSIS — N390 Urinary tract infection, site not specified: Secondary | ICD-10-CM | POA: Diagnosis not present

## 2016-07-03 DIAGNOSIS — R350 Frequency of micturition: Secondary | ICD-10-CM | POA: Diagnosis not present

## 2016-07-04 DIAGNOSIS — Z4789 Encounter for other orthopedic aftercare: Secondary | ICD-10-CM | POA: Diagnosis not present

## 2016-07-04 DIAGNOSIS — M25572 Pain in left ankle and joints of left foot: Secondary | ICD-10-CM | POA: Diagnosis not present

## 2016-07-29 DIAGNOSIS — I10 Essential (primary) hypertension: Secondary | ICD-10-CM | POA: Diagnosis not present

## 2016-07-29 DIAGNOSIS — F17200 Nicotine dependence, unspecified, uncomplicated: Secondary | ICD-10-CM | POA: Diagnosis not present

## 2016-07-29 DIAGNOSIS — J449 Chronic obstructive pulmonary disease, unspecified: Secondary | ICD-10-CM | POA: Diagnosis not present

## 2016-08-14 DIAGNOSIS — B351 Tinea unguium: Secondary | ICD-10-CM | POA: Diagnosis not present

## 2016-08-14 DIAGNOSIS — L821 Other seborrheic keratosis: Secondary | ICD-10-CM | POA: Diagnosis not present

## 2016-08-14 DIAGNOSIS — B353 Tinea pedis: Secondary | ICD-10-CM | POA: Diagnosis not present

## 2016-08-20 DIAGNOSIS — M25572 Pain in left ankle and joints of left foot: Secondary | ICD-10-CM | POA: Diagnosis not present

## 2016-08-20 DIAGNOSIS — Z9889 Other specified postprocedural states: Secondary | ICD-10-CM | POA: Diagnosis not present

## 2016-10-29 ENCOUNTER — Ambulatory Visit
Admission: RE | Admit: 2016-10-29 | Discharge: 2016-10-29 | Disposition: A | Discharge status: Home / Self Care | Payer: Medicare Other | Source: Ambulatory Visit | Attending: Acute Care | Admitting: Acute Care

## 2016-10-29 DIAGNOSIS — Z87891 Personal history of nicotine dependence: Secondary | ICD-10-CM | POA: Diagnosis not present

## 2016-10-29 DIAGNOSIS — F17210 Nicotine dependence, cigarettes, uncomplicated: Secondary | ICD-10-CM

## 2016-10-31 ENCOUNTER — Telehealth: Payer: Self-pay | Admitting: Acute Care

## 2016-10-31 DIAGNOSIS — F17210 Nicotine dependence, cigarettes, uncomplicated: Secondary | ICD-10-CM

## 2016-10-31 NOTE — Telephone Encounter (Signed)
I have called Mrs. [name] the results of her low-dose screening CT. I explained that her scan was read as a lung RADS 2, indicating nodules that are benign in appearance or behavior. Recommendation per radiology is for continued annual screening with low-dose chest CT in November 2018. I explained that we will order and schedule the scan for November 2018. I explained to her husband,  who answered the phone, that there was a finding of a upper normal ascending aortic size of 3.8 cm. I explained that there would be a attention to that vessel on follow-up in November 2018. 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 In this non-gated exam degree or severity cannot be determined. I explained that I will fax results of this scan to Dr. Harlan Stains the patient's primary care physician and that she will follow up if she feels is clinically necessary. Dr. Dema Severin monitors the patient's cholesterol and triglycerides annually. [name] verbalized understanding of the above, assured me that he will get the message to his wife, [name]. They have my contact information in the event they have any further questions in the future.

## 2016-11-10 DIAGNOSIS — J069 Acute upper respiratory infection, unspecified: Secondary | ICD-10-CM | POA: Diagnosis not present

## 2016-11-18 DIAGNOSIS — N183 Chronic kidney disease, stage 3 (moderate): Secondary | ICD-10-CM | POA: Diagnosis not present

## 2016-11-18 DIAGNOSIS — R413 Other amnesia: Secondary | ICD-10-CM | POA: Diagnosis not present

## 2016-11-18 DIAGNOSIS — F17200 Nicotine dependence, unspecified, uncomplicated: Secondary | ICD-10-CM | POA: Diagnosis not present

## 2016-11-18 DIAGNOSIS — I129 Hypertensive chronic kidney disease with stage 1 through stage 4 chronic kidney disease, or unspecified chronic kidney disease: Secondary | ICD-10-CM | POA: Diagnosis not present

## 2016-11-18 DIAGNOSIS — J069 Acute upper respiratory infection, unspecified: Secondary | ICD-10-CM | POA: Diagnosis not present

## 2016-11-18 DIAGNOSIS — J449 Chronic obstructive pulmonary disease, unspecified: Secondary | ICD-10-CM | POA: Diagnosis not present

## 2016-11-18 DIAGNOSIS — I2510 Atherosclerotic heart disease of native coronary artery without angina pectoris: Secondary | ICD-10-CM | POA: Diagnosis not present

## 2016-12-10 DIAGNOSIS — M19072 Primary osteoarthritis, left ankle and foot: Secondary | ICD-10-CM | POA: Diagnosis not present

## 2016-12-19 DIAGNOSIS — K5090 Crohn's disease, unspecified, without complications: Secondary | ICD-10-CM | POA: Diagnosis not present

## 2017-02-10 ENCOUNTER — Encounter: Payer: Self-pay | Admitting: Psychology

## 2017-02-10 ENCOUNTER — Ambulatory Visit (INDEPENDENT_AMBULATORY_CARE_PROVIDER_SITE_OTHER): Payer: Medicare Other | Admitting: Psychology

## 2017-02-10 DIAGNOSIS — R413 Other amnesia: Secondary | ICD-10-CM | POA: Diagnosis not present
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
## 2017-02-19 ENCOUNTER — Ambulatory Visit (INDEPENDENT_AMBULATORY_CARE_PROVIDER_SITE_OTHER): Payer: Medicare Other | Admitting: Psychology

## 2017-02-19 DIAGNOSIS — R413 Other amnesia: Secondary | ICD-10-CM

## 2017-02-19 NOTE — Progress Notes (Signed)
   Neuropsychology Note  [name] returned today for 2 hours of neuropsychological testing with technician, Milana Kidney, BS, under the supervision of Dr. Macarthur Critchley. The patient did not appear overtly distressed by the testing session, per behavioral observation or via self-report to the technician. Rest breaks were offered. [name] will return within 2 weeks for a feedback session with Dr. Si Raider at which time her test performances, clinical impressions and treatment recommendations will be reviewed in detail. The patient understands she can contact our office should she require our assistance before this time.  Full report to follow.
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
## 2017-03-05 ENCOUNTER — Ambulatory Visit (INDEPENDENT_AMBULATORY_CARE_PROVIDER_SITE_OTHER): Payer: Medicare Other | Admitting: Psychology

## 2017-03-05 ENCOUNTER — Encounter: Payer: Self-pay | Admitting: Psychology

## 2017-03-05 DIAGNOSIS — R413 Other amnesia: Secondary | ICD-10-CM

## 2017-03-05 NOTE — Patient Instructions (Addendum)
Fortunately, results of cognitive testing were not indicative of a neurocognitive disorder or dementia. These results can serve as a baseline for future comparison if ever needed.  It is most likely the case that your cognitive symptoms in daily life are due to significant stress.   Below is some more information on this.  I highly recommend that you implement more activities for stress management and that you work on building a social support network. Specific ideas might include attending [employer], volunteering or participating in another community organization. Self-care activities such as regular exercise, good nutrition and hydration, relaxation techniques and scheduling pleasurable activities are also recommended. Obviously, smoking cessation is recommended for brain health, but you will need to develop alternative stress coping skills in order to reduce your smoking since it currently serves to manage stress. You may consider attending a smoking cessation group or working with a counselor on this. Counseling can also be helpful in dealing with the stress of a loved one's substance abuse problem.   How STRESS and ANXIETY affect your ability to pay attention  . In times of stress, it is common to have difficulty concentrating, paying attention and making decisions . Various anxiety disorders also interfere with attention and concentration . Problems with attention and concentration can disrupt the process of learning and making new memories, which can make it seem like there is a problem with your memory. In your daily life, you may experience this disruption as forgetting names and appointments, misplacing items, and needing to make lists for shopping and errands  . Regardless of the test scores on neuropsychological evaluation, psychosocial stress can interfere with the ability to make use of your cognitive resources and function optimally across settings such as work or school, maintaining the  home and responsibilities, and personal relationships  . Assessment and monitoring of symptoms o A thorough psychological evaluation is important in order to assess current symptoms and determine the best treatment o The assessment provides a starting point to monitor symptom improvement  . Treatment o Psychotherapy: Individual and group psychotherapy have been shown to be effective in improving the symptoms of depression and anxiety disorders, and typically cognitive difficulties improve with symptom reduction o Mindfulness based interventions have been shown to be effective in improving stress management o Behavioral strategies for improving attention and concentration in your daily life may be helpful.   . Pleasurable activities, exercise, daily structure o People who have high stress levels often stop engaging in activities that they enjoy, which can have an additional negative effect on their mood. It is recommended that all patients set aside time in their schedule to engage in pleasurable activities because this has been shown to help alleviate many types of symptoms. Examples of pleasurable activities include taking a walk/spending time outdoors, reading or seeing a movie, spending time with friends and/or family, listening to music, and volunteering o In addition to the numerous physical health benefits of exercise, there are many psychological and cognitive benefits as well.  o Maintaining a regular schedule is also recommended, such as setting regular sleep/wake times and scheduling activities such as social outings

## 2017-03-06 DIAGNOSIS — M19072 Primary osteoarthritis, left ankle and foot: Secondary | ICD-10-CM | POA: Diagnosis not present

## 2017-03-26 DIAGNOSIS — F17200 Nicotine dependence, unspecified, uncomplicated: Secondary | ICD-10-CM | POA: Diagnosis not present

## 2017-03-26 DIAGNOSIS — N183 Chronic kidney disease, stage 3 (moderate): Secondary | ICD-10-CM | POA: Diagnosis not present

## 2017-03-26 DIAGNOSIS — Z1231 Encounter for screening mammogram for malignant neoplasm of breast: Secondary | ICD-10-CM | POA: Diagnosis not present

## 2017-03-26 DIAGNOSIS — J449 Chronic obstructive pulmonary disease, unspecified: Secondary | ICD-10-CM | POA: Diagnosis not present

## 2017-03-26 DIAGNOSIS — F439 Reaction to severe stress, unspecified: Secondary | ICD-10-CM | POA: Diagnosis not present

## 2017-03-26 DIAGNOSIS — M25511 Pain in right shoulder: Secondary | ICD-10-CM | POA: Diagnosis not present

## 2017-03-26 DIAGNOSIS — I129 Hypertensive chronic kidney disease with stage 1 through stage 4 chronic kidney disease, or unspecified chronic kidney disease: Secondary | ICD-10-CM | POA: Diagnosis not present

## 2017-03-27 ENCOUNTER — Other Ambulatory Visit: Payer: Self-pay | Admitting: Family Medicine

## 2017-03-27 DIAGNOSIS — Z1231 Encounter for screening mammogram for malignant neoplasm of breast: Secondary | ICD-10-CM

## 2017-04-02 DIAGNOSIS — H02839 Dermatochalasis of unspecified eye, unspecified eyelid: Secondary | ICD-10-CM | POA: Diagnosis not present

## 2017-04-02 DIAGNOSIS — Z961 Presence of intraocular lens: Secondary | ICD-10-CM | POA: Diagnosis not present

## 2017-04-08 DIAGNOSIS — M12811 Other specific arthropathies, not elsewhere classified, right shoulder: Secondary | ICD-10-CM | POA: Diagnosis not present

## 2017-04-21 ENCOUNTER — Ambulatory Visit
Admission: RE | Admit: 2017-04-21 | Discharge: 2017-04-21 | Disposition: A | Discharge status: Home / Self Care | Payer: Medicare Other | Source: Ambulatory Visit | Attending: Family Medicine | Admitting: Family Medicine

## 2017-04-21 DIAGNOSIS — Z1231 Encounter for screening mammogram for malignant neoplasm of breast: Secondary | ICD-10-CM

## 2017-05-01 DIAGNOSIS — B078 Other viral warts: Secondary | ICD-10-CM | POA: Diagnosis not present

## 2017-05-01 DIAGNOSIS — L821 Other seborrheic keratosis: Secondary | ICD-10-CM | POA: Diagnosis not present

## 2017-05-01 DIAGNOSIS — C44622 Squamous cell carcinoma of skin of right upper limb, including shoulder: Secondary | ICD-10-CM | POA: Diagnosis not present

## 2017-05-18 DIAGNOSIS — M25572 Pain in left ankle and joints of left foot: Secondary | ICD-10-CM | POA: Diagnosis not present

## 2017-05-18 DIAGNOSIS — M19072 Primary osteoarthritis, left ankle and foot: Secondary | ICD-10-CM | POA: Diagnosis not present

## 2017-05-18 DIAGNOSIS — Z9689 Presence of other specified functional implants: Secondary | ICD-10-CM | POA: Diagnosis not present

## 2017-05-18 DIAGNOSIS — G8929 Other chronic pain: Secondary | ICD-10-CM | POA: Diagnosis not present

## 2017-05-21 ENCOUNTER — Other Ambulatory Visit: Payer: Self-pay | Admitting: Orthopedic Surgery

## 2017-06-05 DIAGNOSIS — Z85828 Personal history of other malignant neoplasm of skin: Secondary | ICD-10-CM | POA: Diagnosis not present

## 2017-06-05 DIAGNOSIS — B078 Other viral warts: Secondary | ICD-10-CM | POA: Diagnosis not present

## 2017-06-05 DIAGNOSIS — Z08 Encounter for follow-up examination after completed treatment for malignant neoplasm: Secondary | ICD-10-CM | POA: Diagnosis not present

## 2017-06-09 DIAGNOSIS — C670 Malignant neoplasm of trigone of bladder: Secondary | ICD-10-CM | POA: Diagnosis not present

## 2017-06-09 DIAGNOSIS — N3941 Urge incontinence: Secondary | ICD-10-CM | POA: Diagnosis not present

## 2017-06-09 DIAGNOSIS — N816 Rectocele: Secondary | ICD-10-CM | POA: Diagnosis not present

## 2017-06-11 DIAGNOSIS — F17200 Nicotine dependence, unspecified, uncomplicated: Secondary | ICD-10-CM | POA: Diagnosis not present

## 2017-06-11 DIAGNOSIS — R300 Dysuria: Secondary | ICD-10-CM | POA: Diagnosis not present

## 2017-06-11 DIAGNOSIS — F419 Anxiety disorder, unspecified: Secondary | ICD-10-CM | POA: Diagnosis not present

## 2017-06-11 DIAGNOSIS — N390 Urinary tract infection, site not specified: Secondary | ICD-10-CM | POA: Diagnosis not present

## 2017-06-16 DIAGNOSIS — M79672 Pain in left foot: Secondary | ICD-10-CM | POA: Diagnosis not present

## 2017-06-16 DIAGNOSIS — G8929 Other chronic pain: Secondary | ICD-10-CM | POA: Diagnosis not present

## 2017-06-26 ENCOUNTER — Encounter (HOSPITAL_BASED_OUTPATIENT_CLINIC_OR_DEPARTMENT_OTHER): Payer: Self-pay | Admitting: *Deleted

## 2017-06-29 ENCOUNTER — Encounter (HOSPITAL_BASED_OUTPATIENT_CLINIC_OR_DEPARTMENT_OTHER)
Admission: RE | Admit: 2017-06-29 | Discharge: 2017-06-29 | Disposition: A | Discharge status: Home / Self Care | Payer: Medicare Other | Source: Ambulatory Visit | Attending: Orthopedic Surgery | Admitting: Orthopedic Surgery

## 2017-06-29 DIAGNOSIS — F17210 Nicotine dependence, cigarettes, uncomplicated: Secondary | ICD-10-CM | POA: Diagnosis not present

## 2017-06-29 DIAGNOSIS — Z4589 Encounter for adjustment and management of other implanted devices: Secondary | ICD-10-CM | POA: Diagnosis not present

## 2017-06-29 DIAGNOSIS — Z8551 Personal history of malignant neoplasm of bladder: Secondary | ICD-10-CM | POA: Diagnosis not present

## 2017-06-29 DIAGNOSIS — I10 Essential (primary) hypertension: Secondary | ICD-10-CM | POA: Diagnosis not present

## 2017-06-29 DIAGNOSIS — J449 Chronic obstructive pulmonary disease, unspecified: Secondary | ICD-10-CM | POA: Diagnosis not present

## 2017-06-29 DIAGNOSIS — Z7982 Long term (current) use of aspirin: Secondary | ICD-10-CM | POA: Diagnosis not present

## 2017-06-29 DIAGNOSIS — M1990 Unspecified osteoarthritis, unspecified site: Secondary | ICD-10-CM | POA: Diagnosis not present

## 2017-06-29 DIAGNOSIS — M19072 Primary osteoarthritis, left ankle and foot: Secondary | ICD-10-CM | POA: Diagnosis not present

## 2017-06-29 DIAGNOSIS — Z79899 Other long term (current) drug therapy: Secondary | ICD-10-CM | POA: Diagnosis not present

## 2017-07-02 ENCOUNTER — Ambulatory Visit (HOSPITAL_BASED_OUTPATIENT_CLINIC_OR_DEPARTMENT_OTHER): Payer: Medicare Other | Admitting: Anesthesiology

## 2017-07-02 ENCOUNTER — Ambulatory Visit (HOSPITAL_BASED_OUTPATIENT_CLINIC_OR_DEPARTMENT_OTHER)
Admission: RE | Admit: 2017-07-02 | Discharge: 2017-07-02 | Disposition: A | Discharge status: Home / Self Care | Payer: Medicare Other | Source: Ambulatory Visit | Attending: Orthopedic Surgery | Admitting: Orthopedic Surgery

## 2017-07-02 ENCOUNTER — Encounter (HOSPITAL_BASED_OUTPATIENT_CLINIC_OR_DEPARTMENT_OTHER)
Admission: RE | Disposition: A | Discharge status: Home / Self Care | Payer: Self-pay | Source: Ambulatory Visit | Attending: Orthopedic Surgery

## 2017-07-02 ENCOUNTER — Encounter (HOSPITAL_BASED_OUTPATIENT_CLINIC_OR_DEPARTMENT_OTHER): Payer: Self-pay | Admitting: Anesthesiology

## 2017-07-02 DIAGNOSIS — M19072 Primary osteoarthritis, left ankle and foot: Secondary | ICD-10-CM | POA: Diagnosis not present

## 2017-07-02 DIAGNOSIS — J449 Chronic obstructive pulmonary disease, unspecified: Secondary | ICD-10-CM | POA: Diagnosis not present

## 2017-07-02 DIAGNOSIS — Z4589 Encounter for adjustment and management of other implanted devices: Secondary | ICD-10-CM | POA: Insufficient documentation

## 2017-07-02 DIAGNOSIS — M1990 Unspecified osteoarthritis, unspecified site: Secondary | ICD-10-CM | POA: Diagnosis not present

## 2017-07-02 DIAGNOSIS — Z7982 Long term (current) use of aspirin: Secondary | ICD-10-CM | POA: Insufficient documentation

## 2017-07-02 DIAGNOSIS — I10 Essential (primary) hypertension: Secondary | ICD-10-CM | POA: Diagnosis not present

## 2017-07-02 DIAGNOSIS — F17210 Nicotine dependence, cigarettes, uncomplicated: Secondary | ICD-10-CM | POA: Insufficient documentation

## 2017-07-02 DIAGNOSIS — Z79899 Other long term (current) drug therapy: Secondary | ICD-10-CM | POA: Insufficient documentation

## 2017-07-02 DIAGNOSIS — G8918 Other acute postprocedural pain: Secondary | ICD-10-CM | POA: Diagnosis not present

## 2017-07-02 DIAGNOSIS — Z8551 Personal history of malignant neoplasm of bladder: Secondary | ICD-10-CM | POA: Insufficient documentation

## 2017-07-02 DIAGNOSIS — M216X2 Other acquired deformities of left foot: Secondary | ICD-10-CM | POA: Diagnosis not present

## 2017-07-02 DIAGNOSIS — Z472 Encounter for removal of internal fixation device: Secondary | ICD-10-CM | POA: Diagnosis not present

## 2017-07-02 DIAGNOSIS — T8484XA Pain due to internal orthopedic prosthetic devices, implants and grafts, initial encounter: Secondary | ICD-10-CM | POA: Diagnosis not present

## 2017-07-02 HISTORY — PX: HARDWARE REMOVAL: SHX979

## 2017-07-02 HISTORY — DX: Nicotine dependence, unspecified, uncomplicated: F17.200

## 2017-07-02 HISTORY — PX: FOOT ARTHRODESIS: SHX1655

## 2017-07-02 SURGERY — REMOVAL, HARDWARE
Anesthesia: General | Site: Foot | Laterality: Left

## 2017-07-02 MED ORDER — OXYCODONE HCL 5 MG PO TABS: 5.0000 mg | ORAL_TABLET | ORAL | 0 refills | Status: DC | PRN

## 2017-07-02 MED ORDER — SODIUM CHLORIDE 0.9 % IV SOLN: INTRAVENOUS | Status: DC

## 2017-07-02 MED ORDER — FENTANYL CITRATE (PF) 100 MCG/2ML IJ SOLN
50.0000 ug | INTRAMUSCULAR | Status: AC | PRN
  Administered 2017-07-02: 50 ug via INTRAVENOUS
  Administered 2017-07-02 (×2): 25 ug via INTRAVENOUS

## 2017-07-02 MED ORDER — LACTATED RINGERS IV SOLN
INTRAVENOUS | Status: DC
Start: 2017-07-02 — End: 2017-07-02
  Administered 2017-07-02: 11:00:00 via INTRAVENOUS

## 2017-07-02 MED ORDER — CHLORHEXIDINE GLUCONATE 4 % EX LIQD: 60.0000 mL | Freq: Once | CUTANEOUS | Status: DC

## 2017-07-02 MED ORDER — ASPIRIN 81 MG PO TABS: 81.0000 mg | ORAL_TABLET | Freq: Two times a day (BID) | ORAL | Status: DC

## 2017-07-02 MED ORDER — MIDAZOLAM HCL 2 MG/2ML IJ SOLN
INTRAMUSCULAR | Status: AC
  Filled 2017-07-02: qty 2

## 2017-07-02 MED ORDER — LIDOCAINE 2% (20 MG/ML) 5 ML SYRINGE
INTRAMUSCULAR | Status: DC | PRN
  Administered 2017-07-02: 80 mg via INTRAVENOUS

## 2017-07-02 MED ORDER — CEFAZOLIN SODIUM-DEXTROSE 2-4 GM/100ML-% IV SOLN
INTRAVENOUS | Status: AC
  Filled 2017-07-02: qty 100

## 2017-07-02 MED ORDER — CEFAZOLIN SODIUM-DEXTROSE 2-4 GM/100ML-% IV SOLN
2.0000 g | INTRAVENOUS | Status: AC
  Administered 2017-07-02: 2 g via INTRAVENOUS

## 2017-07-02 MED ORDER — ONDANSETRON HCL 4 MG/2ML IJ SOLN
INTRAMUSCULAR | Status: AC
  Filled 2017-07-02: qty 2

## 2017-07-02 MED ORDER — LIDOCAINE HCL (CARDIAC) 20 MG/ML IV SOLN
INTRAVENOUS | Status: AC
  Filled 2017-07-02: qty 5

## 2017-07-02 MED ORDER — FENTANYL CITRATE (PF) 100 MCG/2ML IJ SOLN
INTRAMUSCULAR | Status: AC
  Filled 2017-07-02: qty 2

## 2017-07-02 MED ORDER — ONDANSETRON HCL 4 MG/2ML IJ SOLN
INTRAMUSCULAR | Status: DC | PRN
  Administered 2017-07-02: 4 mg via INTRAVENOUS

## 2017-07-02 MED ORDER — DEXAMETHASONE SODIUM PHOSPHATE 4 MG/ML IJ SOLN
INTRAMUSCULAR | Status: DC | PRN
  Administered 2017-07-02: 10 mg via INTRAVENOUS

## 2017-07-02 MED ORDER — MIDAZOLAM HCL 2 MG/2ML IJ SOLN
1.0000 mg | INTRAMUSCULAR | Status: DC | PRN
  Administered 2017-07-02: 1 mg via INTRAVENOUS

## 2017-07-02 MED ORDER — FENTANYL CITRATE (PF) 100 MCG/2ML IJ SOLN: 25.0000 ug | INTRAMUSCULAR | Status: DC | PRN

## 2017-07-02 MED ORDER — PROPOFOL 10 MG/ML IV BOLUS
INTRAVENOUS | Status: DC | PRN
  Administered 2017-07-02: 100 mg via INTRAVENOUS

## 2017-07-02 MED ORDER — 0.9 % SODIUM CHLORIDE (POUR BTL) OPTIME
TOPICAL | Status: DC | PRN
  Administered 2017-07-02: 150 mL

## 2017-07-02 MED ORDER — SCOPOLAMINE 1 MG/3DAYS TD PT72: 1.0000 | MEDICATED_PATCH | Freq: Once | TRANSDERMAL | Status: DC | PRN

## 2017-07-02 MED ORDER — DEXAMETHASONE SODIUM PHOSPHATE 10 MG/ML IJ SOLN
INTRAMUSCULAR | Status: AC
  Filled 2017-07-02: qty 1

## 2017-07-02 MED ORDER — ONDANSETRON HCL 4 MG/2ML IJ SOLN: 4.0000 mg | Freq: Once | INTRAMUSCULAR | Status: DC | PRN

## 2017-07-02 SURGICAL SUPPLY — 77 items
BANDAGE ACE 4X5 VEL STRL LF (GAUZE/BANDAGES/DRESSINGS) IMPLANT
BANDAGE ESMARK 6X9 LF (GAUZE/BANDAGES/DRESSINGS) IMPLANT
BENZOIN TINCTURE PRP APPL 2/3 (GAUZE/BANDAGES/DRESSINGS) IMPLANT
BIT DRILL 4.8X200 CANN (BIT) ×3 IMPLANT
BLADE AVERAGE 25MMX9MM (BLADE)
BLADE AVERAGE 25X9 (BLADE) IMPLANT
BLADE MICRO SAGITTAL (BLADE) IMPLANT
BLADE SURG 15 STRL LF DISP TIS (BLADE) ×2 IMPLANT
BLADE SURG 15 STRL SS (BLADE) ×4
BNDG COHESIVE 4X5 TAN STRL (GAUZE/BANDAGES/DRESSINGS) ×3 IMPLANT
BNDG COHESIVE 6X5 TAN STRL LF (GAUZE/BANDAGES/DRESSINGS) ×3 IMPLANT
BNDG ESMARK 4X9 LF (GAUZE/BANDAGES/DRESSINGS) IMPLANT
BNDG ESMARK 6X9 LF (GAUZE/BANDAGES/DRESSINGS)
CHLORAPREP W/TINT 26ML (MISCELLANEOUS) ×3 IMPLANT
CLOSURE WOUND 1/2 X4 (GAUZE/BANDAGES/DRESSINGS)
COVER BACK TABLE 60X90IN (DRAPES) ×3 IMPLANT
CUFF TOURNIQUET SINGLE 34IN LL (TOURNIQUET CUFF) ×3 IMPLANT
DECANTER SPIKE VIAL GLASS SM (MISCELLANEOUS) IMPLANT
DRAPE EXTREMITY T 121X128X90 (DRAPE) ×3 IMPLANT
DRAPE OEC MINIVIEW 54X84 (DRAPES) ×3 IMPLANT
DRAPE SURG 17X23 STRL (DRAPES) IMPLANT
DRAPE U-SHAPE 47X51 STRL (DRAPES) ×3 IMPLANT
DRILL BIT 1/8DIAX5INL DISPOSE (BIT) ×3 IMPLANT
DRSG MEPITEL 4X7.2 (GAUZE/BANDAGES/DRESSINGS) ×3 IMPLANT
DRSG PAD ABDOMINAL 8X10 ST (GAUZE/BANDAGES/DRESSINGS) ×6 IMPLANT
ELECT REM PT RETURN 9FT ADLT (ELECTROSURGICAL) ×3
ELECTRODE REM PT RTRN 9FT ADLT (ELECTROSURGICAL) ×1 IMPLANT
GAUZE SPONGE 4X4 12PLY STRL (GAUZE/BANDAGES/DRESSINGS) ×3 IMPLANT
GLOVE BIO SURGEON STRL SZ8 (GLOVE) ×3 IMPLANT
GLOVE BIOGEL PI IND STRL 7.0 (GLOVE) ×3 IMPLANT
GLOVE BIOGEL PI IND STRL 8 (GLOVE) ×1 IMPLANT
GLOVE BIOGEL PI INDICATOR 7.0 (GLOVE) ×6
GLOVE BIOGEL PI INDICATOR 8 (GLOVE) ×2
GLOVE ECLIPSE 6.5 STRL STRAW (GLOVE) ×6 IMPLANT
GLOVE ECLIPSE 8.0 STRL XLNG CF (GLOVE) IMPLANT
GOWN STRL REUS W/ TWL LRG LVL3 (GOWN DISPOSABLE) ×2 IMPLANT
GOWN STRL REUS W/ TWL XL LVL3 (GOWN DISPOSABLE) ×1 IMPLANT
GOWN STRL REUS W/TWL LRG LVL3 (GOWN DISPOSABLE) ×4
GOWN STRL REUS W/TWL XL LVL3 (GOWN DISPOSABLE) ×2
K-WIRE 9  SMOOTH .062 (WIRE) IMPLANT
KIT ARCUS SIZING TEMPLATE STRL (KITS) IMPLANT
NDL SAFETY ECLIPSE 18X1.5 (NEEDLE) IMPLANT
NEEDLE HYPO 18GX1.5 SHARP (NEEDLE)
NEEDLE HYPO 22GX1.5 SAFETY (NEEDLE) IMPLANT
PACK BASIN DAY SURGERY FS (CUSTOM PROCEDURE TRAY) ×3 IMPLANT
PAD CAST 4YDX4 CTTN HI CHSV (CAST SUPPLIES) ×1 IMPLANT
PADDING CAST ABS 4INX4YD NS (CAST SUPPLIES)
PADDING CAST ABS COTTON 4X4 ST (CAST SUPPLIES) IMPLANT
PADDING CAST COTTON 4X4 STRL (CAST SUPPLIES) ×2
PADDING CAST COTTON 6X4 STRL (CAST SUPPLIES) ×3 IMPLANT
PENCIL BUTTON HOLSTER BLD 10FT (ELECTRODE) ×3 IMPLANT
PIN GUIDE DRILL TIP 2.8X300 (DRILL) ×6 IMPLANT
SANITIZER HAND PURELL 535ML FO (MISCELLANEOUS) ×3 IMPLANT
SCREW 16MM THREAD 6.5X75MM (Screw) ×3 IMPLANT
SCREW 16MM THREAD 6.5X85MM (Screw) ×3 IMPLANT
SHEET MEDIUM DRAPE 40X70 STRL (DRAPES) ×3 IMPLANT
SLEEVE SCD COMPRESS KNEE MED (MISCELLANEOUS) ×3 IMPLANT
SPLINT FAST PLASTER 5X30 (CAST SUPPLIES) ×40
SPLINT PLASTER CAST FAST 5X30 (CAST SUPPLIES) ×20 IMPLANT
SPONGE LAP 18X18 X RAY DECT (DISPOSABLE) ×3 IMPLANT
SPONGE SURGIFOAM ABS GEL 12-7 (HEMOSTASIS) IMPLANT
STOCKINETTE 6  STRL (DRAPES) ×2
STOCKINETTE 6 STRL (DRAPES) ×1 IMPLANT
STRIP CLOSURE SKIN 1/2X4 (GAUZE/BANDAGES/DRESSINGS) IMPLANT
SUCTION FRAZIER HANDLE 10FR (MISCELLANEOUS) ×2
SUCTION TUBE FRAZIER 10FR DISP (MISCELLANEOUS) ×1 IMPLANT
SUT ETHILON 3 0 PS 1 (SUTURE) ×3 IMPLANT
SUT MNCRL AB 3-0 PS2 18 (SUTURE) ×3 IMPLANT
SUT VIC AB 0 SH 27 (SUTURE) IMPLANT
SUT VIC AB 2-0 SH 27 (SUTURE)
SUT VIC AB 2-0 SH 27XBRD (SUTURE) IMPLANT
SYR BULB 3OZ (MISCELLANEOUS) ×3 IMPLANT
SYR CONTROL 10ML LL (SYRINGE) IMPLANT
TOWEL OR 17X24 6PK STRL BLUE (TOWEL DISPOSABLE) ×6 IMPLANT
TUBE CONNECTING 20'X1/4 (TUBING) ×1
TUBE CONNECTING 20X1/4 (TUBING) ×2 IMPLANT
UNDERPAD 30X30 (UNDERPADS AND DIAPERS) ×3 IMPLANT

## 2017-07-02 NOTE — Transfer of Care (Signed)
Immediate Anesthesia Transfer of Care Note  Patient: [name]  Procedure(s) Performed: Procedure(s): Removal of Deep Implants (Left) Left Subtalar Arthrodesis (Left)  Patient Location: PACU  Anesthesia Type:GA combined with regional for post-op pain  Level of Consciousness: sedated and patient cooperative  Airway & Oxygen Therapy: Patient Spontanous Breathing and Patient connected to face mask oxygen  Post-op Assessment: Report given to RN and Post -op Vital signs reviewed and stable  Post vital signs: Reviewed and stable  Last Vitals:  Vitals:   07/02/17 1130 07/02/17 1145  BP: 120/72 107/66  Pulse: 61 (!) 59  Resp: 15 18  Temp:      Last Pain:  Vitals:   07/02/17 1042  TempSrc: Oral         Complications: No apparent anesthesia complications

## 2017-07-02 NOTE — Anesthesia Procedure Notes (Signed)
Procedure Name: LMA Insertion Date/Time: 07/02/2017 12:15 PM Performed by: Lyndee Leo Pre-anesthesia Checklist: Patient identified, Emergency Drugs available, Suction available and Patient being monitored Patient Re-evaluated:Patient Re-evaluated prior to induction Oxygen Delivery Method: Circle system utilized Preoxygenation: Pre-oxygenation with 100% oxygen Induction Type: IV induction Ventilation: Mask ventilation without difficulty LMA: LMA inserted LMA Size: 4.0 Number of attempts: 1 Airway Equipment and Method: Bite block Placement Confirmation: positive ETCO2 Tube secured with: Tape Dental Injury: Teeth and Oropharynx as per pre-operative assessment

## 2017-07-02 NOTE — Anesthesia Preprocedure Evaluation (Addendum)
Anesthesia Evaluation  Patient identified by MRN, date of birth, ID band Patient awake    Reviewed: Allergy & Precautions, NPO status , Patient's Chart, lab work & pertinent test results  Airway Mallampati: II  TM Distance: >3 FB Neck ROM: Full    Dental  (+) Dental Advisory Given, Lower Dentures, Upper Dentures   Pulmonary COPD,  COPD inhaler, Current Smoker,    Pulmonary exam normal breath sounds clear to auscultation       Cardiovascular hypertension, Pt. on medications Normal cardiovascular exam Rhythm:Regular Rate:Normal     Neuro/Psych negative neurological ROS  negative psych ROS   GI/Hepatic negative GI ROS, Neg liver ROS,   Endo/Other  negative endocrine ROS  Renal/GU negative Renal ROS   Bladder cancer    Musculoskeletal  (+) Arthritis ,   Abdominal   Peds  Hematology negative hematology ROS (+)   Anesthesia Other Findings Day of surgery medications reviewed with the patient.  Reproductive/Obstetrics                            Anesthesia Physical Anesthesia Plan  ASA: III  Anesthesia Plan: General   Post-op Pain Management:  Regional for Post-op pain   Induction: Intravenous  PONV Risk Score and Plan: 2 and Ondansetron and Dexamethasone  Airway Management Planned: Oral ETT  Additional Equipment:   Intra-op Plan:   Post-operative Plan: Extubation in OR  Informed Consent: I have reviewed the patients History and Physical, chart, labs and discussed the procedure including the risks, benefits and alternatives for the proposed anesthesia with the patient or authorized representative who has indicated his/her understanding and acceptance.   Dental advisory given  Plan Discussed with: CRNA  Anesthesia Plan Comments: (Risks/benefits of general anesthesia discussed with patient including risk of damage to teeth, lips, gum, and tongue, nausea/vomiting, allergic reactions  to medications, and the possibility of heart attack, stroke and death.  All patient questions answered.  Patient wishes to proceed.)       Anesthesia Quick Evaluation

## 2017-07-02 NOTE — Progress Notes (Signed)
Assisted Dr. Turk with left, ultrasound guided, popliteal/saphenous block. Side rails up, monitors on throughout procedure. See vital signs in flow sheet. Tolerated Procedure well. 

## 2017-07-02 NOTE — Anesthesia Postprocedure Evaluation (Signed)
Anesthesia Post Note  Patient: [name]  Procedure(s) Performed: Procedure(s) (LRB): Removal of Deep Implants (Left) Left Subtalar Arthrodesis (Left)     Patient location during evaluation: PACU Anesthesia Type: General Level of consciousness: awake and alert Pain management: pain level controlled Vital Signs Assessment: post-procedure vital signs reviewed and stable Respiratory status: spontaneous breathing, nonlabored ventilation, respiratory function stable and patient connected to nasal cannula oxygen Cardiovascular status: blood pressure returned to baseline and stable Postop Assessment: no signs of nausea or vomiting Anesthetic complications: no    Last Vitals:  Vitals:   07/02/17 1345 07/02/17 1400  BP: 125/71 133/75  Pulse: 71 71  Resp: 13 15  Temp:      Last Pain:  Vitals:   07/02/17 1400  TempSrc:   PainSc: 0-No pain                 Catalina Gravel

## 2017-07-02 NOTE — Brief Op Note (Signed)
07/02/2017  1:33 PM  PATIENT:  [name]  76 y.o. 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  Removal of Deep Implants from the left calcaneus (separate incision) 2.  Left Subtalar Arthrodesis 3.  AP, lateral and Harris heel xrays of the left foot  SURGEON:  Wylene Simmer, MD  ASSISTANT: none  ANESTHESIA:   General, regional  EBL:  minimal   TOURNIQUET:   Total Tourniquet Time Documented: Thigh (Left) - 48 minutes Total: Thigh (Left) - 48 minutes   COMPLICATIONS:  None apparent  DISPOSITION:  Extubated, awake and stable to recovery.  PROCEDURE IN DETAIL:  After pre operative consent was obtained, and the correct operative site was identified, the patient was brought to the operating room and placed supine on the OR table.  Anesthesia was administered.  Pre-operative antibiotics were administered.  A surgical timeout was taken.  The left lower extremity was then prepped and draped in standard sterile fashion. The extremity was exsanguinated and the tourniquet was inflated to 250 mmHg.  The patient's previous lateral hindfoot incision was identified. The incision was again opened with a scalpel and dissection was carried down through the subcutaneous tissues to the superficial aspect of the plate. Soft tissues around the plate were dissected free. 4 screws were removed without difficulty. The plate was removed without difficulty. The wound was then irrigated and closed with horizontal mattress sutures of 3-0 nylon.  Attention was then turned to the sinus Tarsi where a curvilinear incision was made. Dissection was carried down through the skin and subcutaneous tissue. The peroneal tendons were identified. The interval between the extensor digitorum brevis and the peroneal tendons was developed. The sinus Tarsi was entered and the cervical  ligament was divided. The posterior facet was identified and degenerative changes were noted. The remaining articular cartilage and subchondral bone were removed with curettes and rongeurs from both sides of the joint. The wound was irrigated copiously. A drill bit was then used to perforate both sides of the joint and break up the subchondral bone. The resultant bone graft was left in place.  Attention was then turned to the heel where a transverse incision was made at the glabrous border. A K wire for the Biomet 6.5 mm cannulated screw set was then inserted from the tuberosity across the posterior facet and into the dome of the talus. AP ankle, lateral ankle and Harris heel views were obtained. These show appropriate alignment of the guidepin. The guidepin was then overdrilled. A 6.5 mm partially-threaded screw was inserted. It was noted to have excellent purchase and compressed the posterior facet appropriately. A second pin was inserted just medial to the first. It was advanced across the subtalar joint into the head of the talus. AP foot, lateral foot and Harris heel views showed appropriate alignment of the guidepin. It was overdrilled and a partially threaded 6.5 mm screw was inserted. It too was noted to have excellent purchase.  Final AP foot lateral foot and Harris heel radiographs showed appropriate position and length of all hardware and appropriate compression of the subtalar joint. The wounds were irrigated copiously. Deep subcutaneous tissues were approximated with Vicryl and skin incisions were closed with nylon after closing the subtenon's tissues with Monocryl. Sterile dressings were applied followed by a well-padded short leg splint. Tourniquet was released after application of the dressings in 48 minutes. The patient was  awakened from anesthesia and transported to the recovery room in stable condition.   FOLLOW UP PLAN:  NWB on the L LE in a short leg splint.  F/u with me in two weeks for  suture removal and casting.  ASA for DVT prophylaxis.   RADIOGRAPHS:  AP foot, lateral foot and Harris heel radiographs were obtained intraoperatively. These show interval arthrodesis of the subtalar joint and removal of the deep implants from the lateral wall of the calcaneus. No other acute injuries are noted.

## 2017-07-02 NOTE — Anesthesia Procedure Notes (Signed)
Anesthesia Regional Block: Popliteal block   Pre-Anesthetic Checklist: ,, timeout performed, Correct Patient, Correct Site, Correct Laterality, Correct Procedure, Correct Position, site marked, Risks and benefits discussed,  Surgical consent,  Pre-op evaluation,  At surgeon's request and post-op pain management  Laterality: Left  Prep: chloraprep       Needles:  Injection technique: Single-shot  Needle Type: Echogenic Needle     Needle Length: 9cm  Needle Gauge: 21     Additional Needles:   Procedures: ultrasound guided,,,,,,,,  Narrative:  Start time: 07/02/2017 11:10 AM End time: 07/02/2017 11:15 AM Injection made incrementally with aspirations every 5 mL.  Performed by: Personally  Anesthesiologist: Catalina Gravel  Additional Notes: No pain on injection. No increased resistance to injection. Injection made in 5cc increments.  Good needle visualization.  Patient tolerated procedure well.  Combined left popliteal/saphenous nerve block.

## 2017-07-02 NOTE — H&P (Signed)
[name] is an 76 y.o. female.   Chief Complaint: left foot pain HPI: 76 y/o female with left subtalar arthritis and retained hardware in the calcaneus presents today for surgical treatment.  She has failed non op treatment to date including activity modification, oral anti inflammatories, bracing and steroid injections.  She presents for removal of deep implants and subtalar arthrodesis.  She has quit smoking since we scheduled her surgery.  Past Medical History:  Diagnosis Date  . Arthritis   . Cancer [facility]) 2004   bladder   . COPD (chronic obstructive pulmonary disease) ([place])   . Hypertension   . OAB (overactive bladder)   . Smoker     Past Surgical History:  Procedure Laterality Date  . BLADDER SURGERY    . CHOLECYSTECTOMY    . FRACTURE SURGERY    . HARDWARE REMOVAL Left 04/24/2016   Procedure: REMOVAL OF DEEP IMPLANT LEFT FOOT;  Surgeon: Wylene Simmer, MD;  Location: [place];  Service: Orthopedics;  Laterality: Left;  ANESTHESIA:  REGIONAL/MONITOR ANESTHESIA  . JOINT REPLACEMENT     knee  . throat polyps    . TUBAL LIGATION      History reviewed. No pertinent family history. Social History:  reports that she has been smoking Cigarettes.  She has a 40.00 pack-year smoking history. She has never used smokeless tobacco. She reports that she does not drink alcohol or use drugs.  Allergies: No Known Allergies  Medications Prior to Admission  Medication Sig Dispense Refill  . aspirin 81 MG tablet Take 81 mg by mouth daily.    . folic acid (FOLVITE) 497 MCG tablet Take 400 mcg by mouth daily.    [name] lisinopril (PRINIVIL,ZESTRIL) 5 MG tablet Take 5 mg by mouth daily.    . mirabegron ER (MYRBETRIQ) 50 MG TB24 tablet Take 50 mg by mouth daily.    . Multiple Vitamins-Minerals (CENTRUM ULTRA WOMENS PO) Take by mouth.    . tiotropium (SPIRIVA) 18 MCG inhalation capsule Place 18 mcg into inhaler and inhale daily.      No results found for this or any previous visit  (from the past 48 hour(s)). No results found.  ROS  No recent f/c/n/v/ wt loss.  Blood pressure 125/67, pulse (!) 47, temperature 97.7 F (36.5 C), temperature source Oral, resp. rate 13, height 5' 3.5" (1.613 m), weight 77.1 kg (170 lb), SpO2 96 %. Physical Exam  wn wd woman in nad.  A and ox 4.  Mood and affect normal.  EOMI.  resp unlabored.  L foot with healed surgical incisions.  No lymphadenopathy.  5/5 strength in PF and DF of the ankle.  Sens to LT intact in the lateral and medial foot.  Assessment/Plan L subtalar arthritis and retained hardware.  To OR for surgical treatment.  The risks and benefits of the alternative treatment options have been discussed in detail.  The patient wishes to proceed with surgery and specifically understands risks of bleeding, infection, nerve damage, blood clots, nonunion, need for additional surgery, amputation and death.   Wylene Simmer, MD July 12, 2017, 11:39 AM

## 2017-07-02 NOTE — Discharge Instructions (Addendum)
Ana Simmer, MD [place]  Please read the following information regarding your care after surgery.  Medications  You only need a prescription for the narcotic pain medicine (ex. oxycodone, Percocet, Norco).  All of the other medicines listed below are available over the counter. X acetominophen (Tylenol) 650 mg every 4-6 hours as you need for minor pain X oxycodone as prescribed for moderate to severe pain X Aleve 2 pills twice a day for the first 3 days after surgery.  Narcotic pain medicine (ex. oxycodone, Percocet, Vicodin) will cause constipation.  To prevent this problem, take the following medicines while you are taking any pain medicine. X docusate sodium (Colace) 100 mg twice a day X senna (Senokot) 2 tablets twice a day  X To help prevent blood clots, take a baby aspirin (81 mg) twice a day for two weeks after surgery.  You should also get up every hour while you are awake to move around.    Weight Bearing ? Bear weight when you are able on your operated leg or foot. ? Bear weight only on the heel of your operated foot in the post-op shoe. X Do not bear any weight on the operated leg or foot.  Cast / Splint / Dressing X Keep your splint, cast or dressing clean and dry.  Dont put anything (coat hanger, pencil, etc) down inside of it.  If it gets damp, use a hair dryer on the cool setting to dry it.  If it gets soaked, call the office to schedule an appointment for a cast change. ? Remove your dressing 3 days after surgery and cover the incisions with dry dressings.    After your dressing, cast or splint is removed; you may shower, but do not soak or scrub the wound.  Allow the water to run over it, and then gently pat it dry.  Swelling It is normal for you to have swelling where you had surgery.  To reduce swelling and pain, keep your toes above your nose for at least 3 days after surgery.  It may be necessary to keep your foot or leg elevated for several weeks.   If it hurts, it should be elevated.  Follow Up Call my office at [phone] when you are discharged from the hospital or surgery center to schedule an appointment to be seen two weeks after surgery.  Call my office at [phone] if you develop a fever >101.5 F, nausea, vomiting, bleeding from the surgical site or severe pain.    Regional Anesthesia Blocks  1. Numbness or the inability to move the "blocked" extremity may last from 3-48 hours after placement. The length of time depends on the medication injected and your individual response to the medication. If the numbness is not going away after 48 hours, call your surgeon.  2. The extremity that is blocked will need to be protected until the numbness is gone and the  Strength has returned. Because you cannot feel it, you will need to take extra care to avoid injury. Because it may be weak, you may have difficulty moving it or using it. You may not know what position it is in without looking at it while the block is in effect.  3. For blocks in the legs and feet, returning to weight bearing and walking needs to be done carefully. You will need to wait until the numbness is entirely gone and the strength has returned. You should be able to move your leg and foot normally before  you try and bear weight or walk. You will need someone to be with you when you first try to ensure you do not fall and possibly risk injury.  4. Bruising and tenderness at the needle site are common side effects and will resolve in a few days.  5. Persistent numbness or new problems with movement should be communicated to the surgeon or the [place] [phone] [place] [phone]).  Post Anesthesia Home Care Instructions  Activity: Get plenty of rest for the remainder of the day. A responsible individual must stay with you for 24 hours following the procedure.  For the next 24 hours, DO NOT: -Drive a car -Conservation officer, nature -Drink alcoholic beverages -Take any medication unless instructed by your physician -Make any legal decisions or sign important papers.  Meals: Start with liquid foods such as gelatin or soup. Progress to regular foods as tolerated. Avoid greasy, spicy, heavy foods. If nausea and/or vomiting occur, drink only clear liquids until the nausea and/or vomiting subsides. Call your physician if vomiting continues.  Special Instructions/Symptoms: Your throat may feel dry or sore from the anesthesia or the breathing tube placed in your throat during surgery. If this causes discomfort, gargle with warm salt water. The discomfort should disappear within 24 hours.  If you had a scopolamine patch placed behind your ear for the management of post- operative nausea and/or vomiting:  1. The medication in the patch is effective for 72 hours, after which it should be removed.  Wrap patch in a tissue and discard in the trash. Wash hands thoroughly with soap and water. 2. You may remove the patch earlier than 72 hours if you experience unpleasant side effects which may include dry mouth, dizziness or visual disturbances. 3. Avoid touching the patch. Wash your hands with soap and water after contact with the patch.

## 2017-07-03 ENCOUNTER — Encounter (HOSPITAL_BASED_OUTPATIENT_CLINIC_OR_DEPARTMENT_OTHER): Payer: Self-pay | Admitting: Orthopedic Surgery

## 2017-07-03 NOTE — Op Note (Signed)
07/02/2017  1:33 PM  PATIENT:  [name]  76 y.o. 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  Removal of Deep Implants from the left calcaneus (separate incision) 2.  Left Subtalar Arthrodesis 3.  AP, lateral and Harris heel xrays of the left foot  SURGEON:  Wylene Simmer, MD  ASSISTANT: none  ANESTHESIA:   General, regional  EBL:  minimal   TOURNIQUET:   Total Tourniquet Time Documented: Thigh (Left) - 48 minutes Total: Thigh (Left) - 48 minutes   COMPLICATIONS:  None apparent  DISPOSITION:  Extubated, awake and stable to recovery.  PROCEDURE IN DETAIL:  After pre operative consent was obtained, and the correct operative site was identified, the patient was brought to the operating room and placed supine on the OR table.  Anesthesia was administered.  Pre-operative antibiotics were administered.  A surgical timeout was taken.  The left lower extremity was then prepped and draped in standard sterile fashion. The extremity was exsanguinated and the tourniquet was inflated to 250 mmHg.  The patient's previous lateral hindfoot incision was identified. The incision was again opened with a scalpel and dissection was carried down through the subcutaneous tissues to the superficial aspect of the plate. Soft tissues around the plate were dissected free. 4 screws were removed without difficulty. The plate was removed without difficulty. The wound was then irrigated and closed with horizontal mattress sutures of 3-0 nylon.  Attention was then turned to the sinus Tarsi where a curvilinear incision was made. Dissection was carried down through the skin and subcutaneous tissue. The peroneal tendons were identified. The interval between the extensor digitorum brevis and the peroneal tendons was developed. The sinus Tarsi was entered and  the cervical ligament was divided. The posterior facet was identified and degenerative changes were noted. The remaining articular cartilage and subchondral bone were removed with curettes and rongeurs from both sides of the joint. The wound was irrigated copiously. A drill bit was then used to perforate both sides of the joint and break up the subchondral bone. The resultant bone graft was left in place.  Attention was then turned to the heel where a transverse incision was made at the glabrous border. A K wire for the Biomet 6.5 mm cannulated screw set was then inserted from the tuberosity across the posterior facet and into the dome of the talus. AP ankle, lateral ankle and Harris heel views were obtained. These show appropriate alignment of the guidepin. The guidepin was then overdrilled. A 6.5 mm partially-threaded screw was inserted. It was noted to have excellent purchase and compressed the posterior facet appropriately. A second pin was inserted just medial to the first. It was advanced across the subtalar joint into the head of the talus. AP foot, lateral foot and Harris heel views showed appropriate alignment of the guidepin. It was overdrilled and a partially threaded 6.5 mm screw was inserted. It too was noted to have excellent purchase.  Final AP foot lateral foot and Harris heel radiographs showed appropriate position and length of all hardware and appropriate compression of the subtalar joint. The wounds were irrigated copiously. Deep subcutaneous tissues were approximated with Vicryl and skin incisions were closed with nylon after closing the subtenon's tissues with Monocryl. Sterile dressings were applied followed by a well-padded short leg splint. Tourniquet was released after application of the dressings in 48 minutes. The patient was  awakened from anesthesia and transported to the recovery room in stable condition.   FOLLOW UP PLAN:  NWB on the L LE in a short leg splint.  F/u with me  in two weeks for suture removal and casting.  ASA for DVT prophylaxis.   RADIOGRAPHS:  AP foot, lateral foot and Harris heel radiographs were obtained intraoperatively. These show interval arthrodesis of the subtalar joint and removal of the deep implants from the lateral wall of the calcaneus. No other acute injuries are noted.

## 2017-07-16 DIAGNOSIS — T8484XD Pain due to internal orthopedic prosthetic devices, implants and grafts, subsequent encounter: Secondary | ICD-10-CM | POA: Diagnosis not present

## 2017-07-16 DIAGNOSIS — M216X2 Other acquired deformities of left foot: Secondary | ICD-10-CM | POA: Diagnosis not present

## 2017-07-16 DIAGNOSIS — Z4789 Encounter for other orthopedic aftercare: Secondary | ICD-10-CM | POA: Diagnosis not present

## 2017-08-14 DIAGNOSIS — M19072 Primary osteoarthritis, left ankle and foot: Secondary | ICD-10-CM | POA: Diagnosis not present

## 2017-08-14 DIAGNOSIS — Z4789 Encounter for other orthopedic aftercare: Secondary | ICD-10-CM | POA: Diagnosis not present

## 2017-09-11 DIAGNOSIS — Z9889 Other specified postprocedural states: Secondary | ICD-10-CM | POA: Diagnosis not present

## 2017-09-11 DIAGNOSIS — M19072 Primary osteoarthritis, left ankle and foot: Secondary | ICD-10-CM | POA: Diagnosis not present

## 2017-10-05 ENCOUNTER — Other Ambulatory Visit: Payer: Self-pay | Admitting: Acute Care

## 2017-10-05 DIAGNOSIS — F17210 Nicotine dependence, cigarettes, uncomplicated: Secondary | ICD-10-CM

## 2017-10-05 DIAGNOSIS — Z122 Encounter for screening for malignant neoplasm of respiratory organs: Secondary | ICD-10-CM

## 2017-10-06 DIAGNOSIS — Z86010 Personal history of colonic polyps: Secondary | ICD-10-CM | POA: Diagnosis not present

## 2017-10-06 DIAGNOSIS — K5090 Crohn's disease, unspecified, without complications: Secondary | ICD-10-CM | POA: Diagnosis not present

## 2017-10-06 DIAGNOSIS — K649 Unspecified hemorrhoids: Secondary | ICD-10-CM | POA: Diagnosis not present

## 2017-10-08 DIAGNOSIS — K5090 Crohn's disease, unspecified, without complications: Secondary | ICD-10-CM | POA: Diagnosis not present

## 2017-10-09 DIAGNOSIS — Z09 Encounter for follow-up examination after completed treatment for conditions other than malignant neoplasm: Secondary | ICD-10-CM | POA: Diagnosis not present

## 2017-10-09 DIAGNOSIS — M19072 Primary osteoarthritis, left ankle and foot: Secondary | ICD-10-CM | POA: Diagnosis not present

## 2017-10-09 DIAGNOSIS — Z9889 Other specified postprocedural states: Secondary | ICD-10-CM | POA: Diagnosis not present

## 2017-10-30 ENCOUNTER — Ambulatory Visit
Admission: RE | Admit: 2017-10-30 | Discharge: 2017-10-30 | Disposition: A | Discharge status: Home / Self Care | Payer: Medicare Other | Source: Ambulatory Visit | Attending: Acute Care | Admitting: Acute Care

## 2017-10-30 DIAGNOSIS — F17210 Nicotine dependence, cigarettes, uncomplicated: Secondary | ICD-10-CM | POA: Diagnosis not present

## 2017-10-30 DIAGNOSIS — Z122 Encounter for screening for malignant neoplasm of respiratory organs: Secondary | ICD-10-CM

## 2017-11-02 ENCOUNTER — Other Ambulatory Visit: Payer: Self-pay | Admitting: Acute Care

## 2017-11-02 DIAGNOSIS — Z122 Encounter for screening for malignant neoplasm of respiratory organs: Secondary | ICD-10-CM

## 2017-11-02 DIAGNOSIS — F17210 Nicotine dependence, cigarettes, uncomplicated: Secondary | ICD-10-CM

## 2017-11-03 ENCOUNTER — Telehealth: Payer: Self-pay

## 2017-11-03 NOTE — Telephone Encounter (Signed)
SENT NOTES TO SCHEDULING 

## 2017-11-13 ENCOUNTER — Telehealth: Payer: Self-pay | Admitting: Cardiovascular Disease

## 2017-11-13 NOTE — Telephone Encounter (Signed)
Received incoming records from [facility] for upcoming appointment on 11/20/17 @ 8:40am with Dr. Oval Linsey. Records given to [facility] in Medical Records. 11/13/17 ab

## 2017-11-16 DIAGNOSIS — F439 Reaction to severe stress, unspecified: Secondary | ICD-10-CM | POA: Diagnosis not present

## 2017-11-16 DIAGNOSIS — F17201 Nicotine dependence, unspecified, in remission: Secondary | ICD-10-CM | POA: Diagnosis not present

## 2017-11-16 DIAGNOSIS — I2510 Atherosclerotic heart disease of native coronary artery without angina pectoris: Secondary | ICD-10-CM | POA: Diagnosis not present

## 2017-11-16 DIAGNOSIS — N183 Chronic kidney disease, stage 3 (moderate): Secondary | ICD-10-CM | POA: Diagnosis not present

## 2017-11-16 DIAGNOSIS — M545 Low back pain: Secondary | ICD-10-CM | POA: Diagnosis not present

## 2017-11-16 DIAGNOSIS — Z0000 Encounter for general adult medical examination without abnormal findings: Secondary | ICD-10-CM | POA: Diagnosis not present

## 2017-11-16 DIAGNOSIS — Z23 Encounter for immunization: Secondary | ICD-10-CM | POA: Diagnosis not present

## 2017-11-16 DIAGNOSIS — I129 Hypertensive chronic kidney disease with stage 1 through stage 4 chronic kidney disease, or unspecified chronic kidney disease: Secondary | ICD-10-CM | POA: Diagnosis not present

## 2017-11-16 DIAGNOSIS — J449 Chronic obstructive pulmonary disease, unspecified: Secondary | ICD-10-CM | POA: Diagnosis not present

## 2017-11-16 DIAGNOSIS — M810 Age-related osteoporosis without current pathological fracture: Secondary | ICD-10-CM | POA: Diagnosis not present

## 2017-11-20 ENCOUNTER — Ambulatory Visit (INDEPENDENT_AMBULATORY_CARE_PROVIDER_SITE_OTHER): Payer: Medicare Other | Admitting: Cardiovascular Disease

## 2017-11-20 ENCOUNTER — Encounter: Payer: Self-pay | Admitting: Cardiovascular Disease

## 2017-11-20 VITALS — BP 130/80 | HR 83 | Ht 63.0 in | Wt 170.0 lb

## 2017-11-20 DIAGNOSIS — Z720 Tobacco use: Secondary | ICD-10-CM | POA: Diagnosis not present

## 2017-11-20 DIAGNOSIS — I259 Chronic ischemic heart disease, unspecified: Secondary | ICD-10-CM

## 2017-11-20 DIAGNOSIS — Z5181 Encounter for therapeutic drug level monitoring: Secondary | ICD-10-CM

## 2017-11-20 DIAGNOSIS — Z13220 Encounter for screening for lipoid disorders: Secondary | ICD-10-CM | POA: Diagnosis not present

## 2017-11-20 DIAGNOSIS — I10 Essential (primary) hypertension: Secondary | ICD-10-CM | POA: Diagnosis not present

## 2017-11-20 DIAGNOSIS — I2510 Atherosclerotic heart disease of native coronary artery without angina pectoris: Secondary | ICD-10-CM | POA: Insufficient documentation

## 2017-11-20 HISTORY — DX: Essential (primary) hypertension: I10

## 2017-11-20 HISTORY — DX: Chronic ischemic heart disease, unspecified: I25.9

## 2017-11-20 HISTORY — DX: Tobacco use: Z72.0

## 2017-11-20 NOTE — Progress Notes (Signed)
Cardiology Office Note   Date:  11/20/2017   ID:  [name], DOB 1941-06-17, MRN [id number]  PCP:  Harlan Stains, [name]  Cardiologist:   Skeet Latch, [name]   Chief Complaint  Patient presents with  . Follow-up    NP CAD.  [name] Headache  . Shortness of Breath    COPD.      History of Present Illness: [name] is a 76 y.o. female with hypertension, mild ascending aortic aneurysm, Crohn's disease in remission, blasdder cancer in remission, COPD, and tobacco abuse who presents for evaluation of coronary calcifications noted on CT scan.  [name] had a lung cancer screening CT on 10/2017.  She was noted to have three-vessel CAD and a 4.1 cm ascending aortic aneurysm.  This was noted by Eric Form, NP and she was referred to cardiology for further evaluation. [name] reports that she has been feeling well.  She denies any chest pain.  She has chronic shortness of breath that is attributable to her COPD.  This has been stable lately.  She exercises several days per week.  She uses resistance bands for 1 hour and then uses the ab roller.  She feels good with with exercise.  She has not noted any lower extremity edema, orthopnea, or PND.  She sometimes feels off balance when walking.  She is unable to walk in a straight line.  She denies any dizziness upon positional changes or with movement of her head.  She does note some generalized fatigue that she attributes to her age.  She recently quit smoking.  She smoked for over 50 years up to 3 packs/day but most commonly around 1 or 1-1/2 packs daily.  She was able to  accomplish this with the use of Smoke Away tablets.      Past Medical History:  Diagnosis Date  . Arthritis   . Asymptomatic coronary heart disease 11/20/2017  . 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 COPD (chronic obstructive pulmonary disease) ([place])   . Essential hypertension 11/20/2017  . Hypertension   . OAB (overactive bladder)   . Smoker   . Tobacco abuse 11/20/2017      Past Surgical History:  Procedure Laterality Date  . BLADDER SURGERY    . CHOLECYSTECTOMY    . FOOT ARTHRODESIS Left 07/02/2017   Procedure: Left Subtalar Arthrodesis;  Surgeon: Wylene Simmer, [name];  Location: [place];  Service: Orthopedics;  Laterality: Left;  . FRACTURE SURGERY    . HARDWARE REMOVAL Left 04/24/2016   Procedure: REMOVAL OF DEEP IMPLANT LEFT FOOT;  Surgeon: Wylene Simmer, [name];  Location: [place];  Service: Orthopedics;  Laterality: Left;  ANESTHESIA:  REGIONAL/MONITOR ANESTHESIA  . HARDWARE REMOVAL Left 07/02/2017   Procedure: Removal of Deep Implants;  Surgeon: Wylene Simmer, [name];  Location: [place];  Service: Orthopedics;  Laterality: Left;  . JOINT REPLACEMENT     knee  . throat polyps    . TUBAL LIGATION       Current Outpatient Medications  Medication Sig Dispense Refill  . aspirin 81 MG tablet Take 1 tablet (81 mg total) by mouth 2 (two) times daily. (Patient taking differently: Take 81 mg by mouth daily. ) 30 tablet   . folic acid (FOLVITE) 702 MCG tablet Take 400 mcg by mouth daily.    [name] lisinopril (PRINIVIL,ZESTRIL) 5 MG tablet Take 5 mg by mouth daily.    . mirabegron ER (MYRBETRIQ) 50 MG TB24  tablet Take 50 mg by mouth daily.    . Multiple Vitamins-Minerals (CENTRUM ULTRA WOMENS PO) Take by mouth.    . tiotropium (SPIRIVA) 18 MCG inhalation capsule Place 18 mcg into inhaler and inhale daily.     No current facility-administered medications for this visit.     Allergies:   Patient has no known allergies.    Social History:  The patient  reports that she has quit smoking. Her smoking use included cigarettes. She has a 40.00 pack-year smoking history. she has never used smokeless tobacco. She reports that she does not drink alcohol or use drugs.   Family History:  The patient's family history includes Cancer in her mother; Stroke in her sister.    ROS:  Please see the history of present illness.    Otherwise, review of systems are positive for none.   All other systems are reviewed and negative.    PHYSICAL EXAM: VS:  BP 130/80   Pulse 83   Ht 5\' 3"  (1.6 m)   Wt 170 lb (77.1 kg)   BMI 30.11 kg/m  , BMI Body mass index is 30.11 kg/m. GENERAL:  Well appearing HEENT:  Pupils equal round and reactive, fundi not visualized, oral mucosa unremarkable NECK:  No jugular venous distention, waveform within normal limits, carotid upstroke brisk and symmetric, no bruits, no thyromegaly LYMPHATICS:  No cervical adenopathy LUNGS:  Clear to auscultation bilaterally HEART:  RRR.  PMI not displaced or sustained,S1 and S2 within normal limits, no S3, no S4, no clicks, no rubs, no murmurs ABD:  Flat, positive bowel sounds normal in frequency in pitch, no bruits, no rebound, no guarding, no midline pulsatile mass, no hepatomegaly, no splenomegaly EXT:  2 plus pulses throughout, no edema, no cyanosis no clubbing SKIN:  No rashes no nodules NEURO:  Cranial nerves II through XII grossly intact, motor grossly intact throughout PSYCH:  Cognitively intact, oriented to person place and time    EKG:  EKG is ordered today. The ekg ordered today demonstrates accelerated junctional rhythm.  R axis deviation.     Recent Labs: No results found for requested labs within last 8760 hours.   11/18/16: Total cholesterol 174, triglycerides 206, HDL 40, LDL 93 TSH 1.0 WBC 11, hemoglobin 15.5, hematocrit 45, platelets 2 8 Sodium 142, potassium 4.4, BUN 11, creatinine 0.94 AST 49, ALT 42  Lipid Panel No results found for: CHOL, TRIG, HDL, CHOLHDL, VLDL, LDLCALC, LDLDIRECT    Wt Readings from Last 3 Encounters:  11/20/17 170 lb (77.1 kg)  07/02/17 170 lb (77.1 kg)  04/24/16 165 lb 6 oz (75 kg)      ASSESSMENT AND PLAN:  # Asymptomatic coronary calcification: [name] has no angina.  She does resistance training.  I also recommended that she do some cardio such as walking or riding her exercise bike.   She was congratulated on her excellent exercise routine.  Given that she has no angina with the symptoms, it is very unlikely that she has obstructive coronary disease.  We discussed the need for prevention.  She is Artie taking aspirin.  We will repeat her lipids and a CMP.  December 2017 her LDL was 93.  Her goal should be less than 70.  We discussed the fact that we would likely recommend a low-dose of rosuvastatin and she is amenable to this.  # Hypertension: Blood pressure is at goal.  Continue lisinopril.   Current medicines are reviewed at length with the patient today.  The  patient does not have concerns regarding medicines.  The following changes have been made:  no change  Labs/ tests ordered today include:   Orders Placed This Encounter  Procedures  . Lipid panel  . Comprehensive metabolic panel  . EKG 12-Lead     Disposition:   FU with Ana Elza C. Oval Linsey, [name], [facility] in 6 months.     This note was written with the assistance of speech recognition software.  Please excuse any transcriptional errors.  Signed, Ana Odenthal C. Oval Linsey, [name], [facility]  11/20/2017 9:00 AM    [place]

## 2017-11-20 NOTE — Patient Instructions (Signed)
Medication Instructions:  Your physician recommends that you continue on your current medications as directed. Please refer to the Current Medication list given to you today.  Labwork: FASTING LP/CMET SOON   Testing/Procedures: NONE  Follow-Up: Your physician wants you to follow-up in: [place] will receive a reminder letter in the mail two months in advance. If you don't receive a letter, please call our office to schedule the follow-up appointment.  If you need a refill on your cardiac medications before your next appointment, please call your pharmacy.

## 2017-11-26 DIAGNOSIS — I10 Essential (primary) hypertension: Secondary | ICD-10-CM | POA: Diagnosis not present

## 2017-11-26 DIAGNOSIS — Z13220 Encounter for screening for lipoid disorders: Secondary | ICD-10-CM | POA: Diagnosis not present

## 2017-11-26 DIAGNOSIS — Z5181 Encounter for therapeutic drug level monitoring: Secondary | ICD-10-CM | POA: Diagnosis not present

## 2017-11-26 LAB — COMPREHENSIVE METABOLIC PANEL
A/G RATIO: 1.1 — AB (ref 1.2–2.2)
ALBUMIN: 4 g/dL (ref 3.5–4.8)
ALT: 31 IU/L (ref 0–32)
AST: 38 IU/L (ref 0–40)
Alkaline Phosphatase: 64 IU/L (ref 39–117)
BILIRUBIN TOTAL: 0.3 mg/dL (ref 0.0–1.2)
BUN / CREAT RATIO: 14 (ref 12–28)
BUN: 17 mg/dL (ref 8–27)
CALCIUM: 9.9 mg/dL (ref 8.7–10.3)
CO2: 26 mmol/L (ref 20–29)
Chloride: 101 mmol/L (ref 96–106)
Creatinine, Ser: 1.23 mg/dL — ABNORMAL HIGH (ref 0.57–1.00)
GFR, EST AFRICAN AMERICAN: 50 mL/min/{1.73_m2} — AB (ref >59)
GFR, EST NON AFRICAN AMERICAN: 43 mL/min/{1.73_m2} — AB (ref >59)
GLUCOSE: 115 mg/dL — AB (ref 65–99)
Globulin, Total: 3.5 g/dL (ref 1.5–4.5)
Potassium: 5.2 mmol/L (ref 3.5–5.2)
Sodium: 141 mmol/L (ref 134–144)
TOTAL PROTEIN: 7.5 g/dL (ref 6.0–8.5)

## 2017-11-26 LAB — LIPID PANEL
CHOL/HDL RATIO: 3.6 ratio (ref 0.0–4.4)
Cholesterol, Total: 134 mg/dL (ref 100–199)
HDL: 37 mg/dL — AB (ref >39)
LDL Calculated: 63 mg/dL (ref 0–99)
TRIGLYCERIDES: 168 mg/dL — AB (ref 0–149)
VLDL Cholesterol Cal: 34 mg/dL (ref 5–40)

## 2017-11-27 ENCOUNTER — Telehealth: Payer: Self-pay | Admitting: Cardiovascular Disease

## 2017-11-27 ENCOUNTER — Ambulatory Visit
Admission: RE | Admit: 2017-11-27 | Discharge: 2017-11-27 | Disposition: A | Discharge status: Home / Self Care | Payer: Medicare Other | Source: Ambulatory Visit | Attending: Family Medicine | Admitting: Family Medicine

## 2017-11-27 ENCOUNTER — Other Ambulatory Visit: Payer: Self-pay | Admitting: Family Medicine

## 2017-11-27 DIAGNOSIS — M5136 Other intervertebral disc degeneration, lumbar region: Secondary | ICD-10-CM | POA: Diagnosis not present

## 2017-11-27 DIAGNOSIS — M545 Low back pain: Secondary | ICD-10-CM

## 2017-11-27 NOTE — Telephone Encounter (Signed)
Returned the call to the patient. She is currently taking aspirin 81 mg daily. Per the last office note, she should take aspirin 81 mg bid. Her PCP has her taking it once daily. She would like clarification on what dosage she should be taking. Message routed to the provider.

## 2017-11-27 NOTE — Telephone Encounter (Signed)
New Message     Patient husband called needs to clarify the aspirin regimen their PCP has one instruction and Dr Oval Linsey has another, they need to verify what she is suppose to be taking.

## 2017-11-30 NOTE — Telephone Encounter (Signed)
Advised patient husband, ok per [facility]

## 2017-11-30 NOTE — Telephone Encounter (Signed)
Agree, 81 mg daily.

## 2017-12-07 ENCOUNTER — Other Ambulatory Visit: Payer: Self-pay | Admitting: Family Medicine

## 2017-12-07 DIAGNOSIS — M5136 Other intervertebral disc degeneration, lumbar region: Secondary | ICD-10-CM

## 2017-12-13 ENCOUNTER — Ambulatory Visit
Admission: RE | Admit: 2017-12-13 | Discharge: 2017-12-13 | Disposition: A | Discharge status: Home / Self Care | Payer: Medicare Other | Source: Ambulatory Visit | Attending: Family Medicine | Admitting: Family Medicine

## 2017-12-13 DIAGNOSIS — M5136 Other intervertebral disc degeneration, lumbar region: Secondary | ICD-10-CM

## 2017-12-13 DIAGNOSIS — M48061 Spinal stenosis, lumbar region without neurogenic claudication: Secondary | ICD-10-CM | POA: Diagnosis not present

## 2017-12-18 DIAGNOSIS — M19079 Primary osteoarthritis, unspecified ankle and foot: Secondary | ICD-10-CM | POA: Diagnosis not present

## 2017-12-18 DIAGNOSIS — M19072 Primary osteoarthritis, left ankle and foot: Secondary | ICD-10-CM | POA: Diagnosis not present

## 2017-12-18 DIAGNOSIS — Z09 Encounter for follow-up examination after completed treatment for conditions other than malignant neoplasm: Secondary | ICD-10-CM | POA: Diagnosis not present

## 2017-12-28 DIAGNOSIS — M545 Low back pain: Secondary | ICD-10-CM | POA: Diagnosis not present

## 2017-12-29 DIAGNOSIS — M47896 Other spondylosis, lumbar region: Secondary | ICD-10-CM | POA: Diagnosis not present

## 2017-12-29 DIAGNOSIS — M545 Low back pain: Secondary | ICD-10-CM | POA: Diagnosis not present

## 2017-12-31 ENCOUNTER — Telehealth: Payer: Self-pay | Admitting: Cardiovascular Disease

## 2017-12-31 NOTE — Telephone Encounter (Signed)
-----   Message from Skeet Latch, MD sent at 12/28/2017  9:52 AM EST ----- Normal kidney function and electrolytes.  LDL or "bad cholesterol" is high. Given her coronary artery disease, recommend starting rosuvastatin 20mg  daily. Repeat lipids and CMP in 6-8 weeks.

## 2017-12-31 NOTE — Telephone Encounter (Signed)
Left message to call back  

## 2017-12-31 NOTE — Telephone Encounter (Signed)
Mrs. Sohm is returning a call. Thanks

## 2018-01-01 NOTE — Telephone Encounter (Signed)
Left message for pt to call.

## 2018-01-04 NOTE — Telephone Encounter (Signed)
lmtcb

## 2018-01-04 NOTE — Telephone Encounter (Signed)
F/u message ° °Pt returning RN call. Please call back to discuss  °

## 2018-01-04 NOTE — Telephone Encounter (Signed)
lmtcb on home phone; spoke with husband and he stated to call patient on home phone

## 2018-01-07 NOTE — Telephone Encounter (Signed)
Leave message for pt to call back 

## 2018-01-19 ENCOUNTER — Encounter: Payer: Self-pay | Admitting: *Deleted

## 2018-01-19 NOTE — Telephone Encounter (Signed)
Mailed letter to contact office regarding lab results  Unable to leave message today.

## 2018-01-26 ENCOUNTER — Telehealth: Payer: Self-pay | Admitting: *Deleted

## 2018-01-26 NOTE — Telephone Encounter (Signed)
Received call from patient stating she received letter from someone at this office and wanted to call to get results.  Advised of results per last telephone note. Patient aware and verbalized understanding.  After chart review-scanned labs with result note is labs from 2016/2017.   More current labs from 11/2017 are in chart with different instructions.  Will follow up with primary prior to sending in rx.

## 2018-01-28 NOTE — Telephone Encounter (Signed)
Left message to call back  63 old labs reviewed in January patients most recent labs in December reviewed by Dr Oval Linsey, no medications  at this time

## 2018-02-02 NOTE — Telephone Encounter (Signed)
Left message to call back  

## 2018-02-10 ENCOUNTER — Telehealth: Payer: Self-pay | Admitting: Cardiovascular Disease

## 2018-02-10 NOTE — Telephone Encounter (Signed)
New Message   Pt c/o of Chest Pain: STAT if CP now or developed within 24 hours  1. Are you having CP right now? No   2. Are you experiencing any other symptoms (ex. SOB, nausea, vomiting, sweating)? sweating  3. How long have you been experiencing CP? Twice in the past two weeks   4. Is your CP continuous or coming and going? Coming and going   5. Have you taken Nitroglycerin? No   Patient states that only in the evening she is experience the chest pains that is causing her to wake up. Please call to discuss.  ?

## 2018-02-10 NOTE — Telephone Encounter (Signed)
Returned call to patient, patient requesting appointment to see Dr. Oval Linsey.  She states in the last 2 weeks she has woken up in the middle of the night with chest pain and a hot sweat.  Also reports associated SOB, denies N/V.  States she lays there and rests and the pain resolves.  Has only occurred in the middle of the night, has not occurred during day with/without exertion.  Denies pain or symptoms at current.  Appointment made for 2/28 at 4pm with Dr. Oval Linsey.  Patient aware and verbalized understanding.

## 2018-02-11 ENCOUNTER — Encounter: Payer: Self-pay | Admitting: Cardiovascular Disease

## 2018-02-11 ENCOUNTER — Ambulatory Visit (INDEPENDENT_AMBULATORY_CARE_PROVIDER_SITE_OTHER): Payer: Medicare Other | Admitting: Cardiovascular Disease

## 2018-02-11 VITALS — BP 112/64 | HR 74 | Ht 63.5 in | Wt 180.2 lb

## 2018-02-11 DIAGNOSIS — I2510 Atherosclerotic heart disease of native coronary artery without angina pectoris: Secondary | ICD-10-CM

## 2018-02-11 DIAGNOSIS — I10 Essential (primary) hypertension: Secondary | ICD-10-CM

## 2018-02-11 DIAGNOSIS — R0789 Other chest pain: Secondary | ICD-10-CM | POA: Diagnosis not present

## 2018-02-11 NOTE — Patient Instructions (Addendum)
Medication Instructions:  Your physician recommends that you continue on your current medications as directed. Please refer to the Current Medication list given to you today.  Labwork: NONE  Testing/Procedures: Your physician has requested that you have en exercise stress myoview. For further information please visit [email or web address]. Please follow instruction sheet, as given.  Follow-Up: Your physician recommends that you schedule a follow-up appointment in: June   If you need a refill on your cardiac medications before your next appointment, please call your pharmacy.  Cardiac Nuclear Scan A cardiac nuclear scan is a test that measures blood flow to the heart when a person is resting and when he or she is exercising. The test looks for problems such as:  Not enough blood reaching a portion of the heart.  The heart muscle not working normally.  You may need this test if:  You have heart disease.  You have had abnormal lab results.  You have had heart surgery or angioplasty.  You have chest pain.  You have shortness of breath.  In this test, a radioactive dye (tracer) is injected into your bloodstream. After the tracer has traveled to your heart, an imaging device is used to measure how much of the tracer is absorbed by or distributed to various areas of your heart. This procedure is usually done at a hospital and takes 2-4 hours. Tell a health care provider about:  Any allergies you have.  All medicines you are taking, including vitamins, herbs, eye drops, creams, and over-the-counter medicines.  Any problems you or family members have had with the use of anesthetic medicines.  Any blood disorders you have.  Any surgeries you have had.  Any medical conditions you have.  Whether you are pregnant or may be pregnant. What are the risks? Generally, this is a safe procedure. However, problems may occur, including:  Serious chest pain and heart attack. This is only a  risk if the stress portion of the test is done.  Rapid heartbeat.  Sensation of warmth in your chest. This usually passes quickly.  What happens before the procedure?  Ask your health care provider about changing or stopping your regular medicines. This is especially important if you are taking diabetes medicines or blood thinners.  Remove your jewelry on the day of the procedure. What happens during the procedure?  An IV tube will be inserted into one of your veins.  Your health care provider will inject a small amount of radioactive tracer through the tube.  You will wait for 20-40 minutes while the tracer travels through your bloodstream.  Your heart activity will be monitored with an electrocardiogram (ECG).  You will lie down on an exam table.  Images of your heart will be taken for about 15-20 minutes.  You may be asked to exercise on a treadmill or stationary bike. While you exercise, your heart's activity will be monitored with an ECG, and your blood pressure will be checked. If you are unable to exercise, you may be given a medicine to increase blood flow to parts of your heart.  When blood flow to your heart has peaked, a tracer will again be injected through the IV tube.  After 20-40 minutes, you will get back on the exam table and have more images taken of your heart.  When the procedure is over, your IV tube will be removed. The procedure may vary among health care providers and hospitals. Depending on the type of tracer used, scans may need to be  repeated 3-4 hours later. What happens after the procedure?  Unless your health care provider tells you otherwise, you may return to your normal schedule, including diet, activities, and medicines.  Unless your health care provider tells you otherwise, you may increase your fluid intake. This will help flush the contrast dye from your body. Drink enough fluid to keep your urine clear or pale yellow.  It is up to you to  get your test results. Ask your health care provider, or the department that is doing the test, when your results will be ready. Summary  A cardiac nuclear scan measures the blood flow to the heart when a person is resting and when he or she is exercising.  You may need this test if you are at risk for heart disease.  Tell your health care provider if you are pregnant.  Unless your health care provider tells you otherwise, increase your fluid intake. This will help flush the contrast dye from your body. Drink enough fluid to keep your urine clear or pale yellow. This information is not intended to replace advice given to you by your health care provider. Make sure you discuss any questions you have with your health care provider. Document Released: 12/26/2004 Document Revised: 12/03/2016 Document Reviewed: 11/09/2013 Elsevier Interactive Patient Education  2017 [employer].
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
## 2018-02-12 ENCOUNTER — Telehealth (HOSPITAL_COMMUNITY): Payer: Self-pay | Admitting: *Deleted

## 2018-02-12 DIAGNOSIS — H04123 Dry eye syndrome of bilateral lacrimal glands: Secondary | ICD-10-CM | POA: Diagnosis not present

## 2018-02-12 NOTE — Telephone Encounter (Signed)
Close encounter 

## 2018-02-17 ENCOUNTER — Ambulatory Visit (HOSPITAL_COMMUNITY)
Admission: RE | Admit: 2018-02-17 | Discharge: 2018-02-17 | Disposition: A | Discharge status: Home / Self Care | Payer: Medicare Other | Source: Ambulatory Visit | Attending: Cardiovascular Disease | Admitting: Cardiovascular Disease

## 2018-02-17 DIAGNOSIS — R0789 Other chest pain: Secondary | ICD-10-CM | POA: Insufficient documentation

## 2018-02-17 LAB — MYOCARDIAL PERFUSION IMAGING
CHL CUP NUCLEAR SRS: 2
CSEPEDS: 30 s
CSEPEW: 7 METS
CSEPPHR: 127 {beats}/min
Exercise duration (min): 6 min
LVDIAVOL: 62 mL (ref 46–106)
LVSYSVOL: 26 mL
MPHR: 145 {beats}/min
Percent HR: 87 %
RPE: 18
Rest HR: 58 {beats}/min
SDS: 0
SSS: 2
TID: 0.81

## 2018-02-17 MED ORDER — TECHNETIUM TC 99M TETROFOSMIN IV KIT
10.4000 | PACK | Freq: Once | INTRAVENOUS | Status: AC | PRN
  Administered 2018-02-17: 10.4 via INTRAVENOUS
  Filled 2018-02-17: qty 11

## 2018-02-17 MED ORDER — TECHNETIUM TC 99M TETROFOSMIN IV KIT
32.0000 | PACK | Freq: Once | INTRAVENOUS | Status: AC | PRN
  Administered 2018-02-17: 32 via INTRAVENOUS
  Filled 2018-02-17: qty 32

## 2018-05-17 DIAGNOSIS — J449 Chronic obstructive pulmonary disease, unspecified: Secondary | ICD-10-CM | POA: Diagnosis not present

## 2018-05-17 DIAGNOSIS — I2510 Atherosclerotic heart disease of native coronary artery without angina pectoris: Secondary | ICD-10-CM | POA: Diagnosis not present

## 2018-05-17 DIAGNOSIS — I129 Hypertensive chronic kidney disease with stage 1 through stage 4 chronic kidney disease, or unspecified chronic kidney disease: Secondary | ICD-10-CM | POA: Diagnosis not present

## 2018-05-17 DIAGNOSIS — G4710 Hypersomnia, unspecified: Secondary | ICD-10-CM | POA: Diagnosis not present

## 2018-05-17 DIAGNOSIS — I700 Atherosclerosis of aorta: Secondary | ICD-10-CM | POA: Diagnosis not present

## 2018-05-17 DIAGNOSIS — N183 Chronic kidney disease, stage 3 (moderate): Secondary | ICD-10-CM | POA: Diagnosis not present

## 2018-06-02 ENCOUNTER — Encounter (HOSPITAL_BASED_OUTPATIENT_CLINIC_OR_DEPARTMENT_OTHER): Payer: Self-pay

## 2018-06-02 DIAGNOSIS — G4710 Hypersomnia, unspecified: Secondary | ICD-10-CM

## 2018-06-02 DIAGNOSIS — R0683 Snoring: Secondary | ICD-10-CM

## 2018-06-02 DIAGNOSIS — R5383 Other fatigue: Secondary | ICD-10-CM

## 2018-06-11 ENCOUNTER — Ambulatory Visit (INDEPENDENT_AMBULATORY_CARE_PROVIDER_SITE_OTHER): Payer: Medicare Other | Admitting: Cardiovascular Disease

## 2018-06-11 ENCOUNTER — Encounter: Payer: Self-pay | Admitting: Cardiovascular Disease

## 2018-06-11 VITALS — BP 117/74 | HR 69 | Ht 63.5 in | Wt 178.6 lb

## 2018-06-11 DIAGNOSIS — I10 Essential (primary) hypertension: Secondary | ICD-10-CM

## 2018-06-11 DIAGNOSIS — I2510 Atherosclerotic heart disease of native coronary artery without angina pectoris: Secondary | ICD-10-CM | POA: Diagnosis not present

## 2018-06-11 DIAGNOSIS — I712 Thoracic aortic aneurysm, without rupture: Secondary | ICD-10-CM | POA: Diagnosis not present

## 2018-06-11 DIAGNOSIS — I7121 Aneurysm of the ascending aorta, without rupture: Secondary | ICD-10-CM

## 2018-06-11 HISTORY — DX: Aneurysm of the ascending aorta, without rupture: I71.21

## 2018-06-11 HISTORY — DX: Thoracic aortic aneurysm, without rupture: I71.2
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
## 2018-06-11 NOTE — Patient Instructions (Signed)
Medication Instructions:  Your physician recommends that you continue on your current medications as directed. Please refer to the Current Medication list given to you today.  Follow-Up: Your physician wants you to follow-up in: 1 year with Dr. Oval Linsey. You will receive a reminder letter in the mail two months in advance. If you don't receive a letter, please call our office to schedule the follow-up appointment.       If you need a refill on your cardiac medications before your next appointment, please call your pharmacy.

## 2018-06-21 ENCOUNTER — Ambulatory Visit: Payer: Medicare Other

## 2018-06-21 ENCOUNTER — Ambulatory Visit
Admission: RE | Admit: 2018-06-21 | Discharge: 2018-06-21 | Disposition: A | Discharge status: Home / Self Care | Payer: Medicare Other | Source: Ambulatory Visit | Attending: Acute Care | Admitting: Acute Care

## 2018-06-21 DIAGNOSIS — F17210 Nicotine dependence, cigarettes, uncomplicated: Secondary | ICD-10-CM

## 2018-06-21 DIAGNOSIS — Z122 Encounter for screening for malignant neoplasm of respiratory organs: Secondary | ICD-10-CM

## 2018-06-22 ENCOUNTER — Other Ambulatory Visit: Payer: Self-pay | Admitting: Acute Care

## 2018-06-22 ENCOUNTER — Telehealth: Payer: Self-pay | Admitting: Acute Care

## 2018-06-22 ENCOUNTER — Other Ambulatory Visit: Payer: Self-pay | Admitting: Family Medicine

## 2018-06-22 DIAGNOSIS — F17210 Nicotine dependence, cigarettes, uncomplicated: Secondary | ICD-10-CM

## 2018-06-22 DIAGNOSIS — Z122 Encounter for screening for malignant neoplasm of respiratory organs: Secondary | ICD-10-CM

## 2018-06-22 DIAGNOSIS — Z1231 Encounter for screening mammogram for malignant neoplasm of breast: Secondary | ICD-10-CM

## 2018-06-22 NOTE — Telephone Encounter (Signed)
Spoke with pt and clarified that next LDCT is due 10/2018 and we will contact her close to that time to schedule. Pt verbalized understanding.  Nothing further needed.

## 2018-06-25 DIAGNOSIS — N3941 Urge incontinence: Secondary | ICD-10-CM | POA: Diagnosis not present

## 2018-06-25 DIAGNOSIS — C670 Malignant neoplasm of trigone of bladder: Secondary | ICD-10-CM | POA: Diagnosis not present

## 2018-06-25 DIAGNOSIS — N816 Rectocele: Secondary | ICD-10-CM | POA: Diagnosis not present

## 2018-06-28 DIAGNOSIS — K5090 Crohn's disease, unspecified, without complications: Secondary | ICD-10-CM | POA: Diagnosis not present

## 2018-06-29 ENCOUNTER — Ambulatory Visit (HOSPITAL_BASED_OUTPATIENT_CLINIC_OR_DEPARTMENT_OTHER): Payer: Medicare Other

## 2018-07-06 ENCOUNTER — Ambulatory Visit (HOSPITAL_BASED_OUTPATIENT_CLINIC_OR_DEPARTMENT_OTHER): Payer: Medicare Other | Attending: Internal Medicine | Admitting: Internal Medicine

## 2018-07-06 DIAGNOSIS — I493 Ventricular premature depolarization: Secondary | ICD-10-CM | POA: Diagnosis not present

## 2018-07-06 DIAGNOSIS — R5383 Other fatigue: Secondary | ICD-10-CM

## 2018-07-06 DIAGNOSIS — G4710 Hypersomnia, unspecified: Secondary | ICD-10-CM | POA: Insufficient documentation

## 2018-07-06 DIAGNOSIS — G4733 Obstructive sleep apnea (adult) (pediatric): Secondary | ICD-10-CM | POA: Diagnosis not present

## 2018-07-06 DIAGNOSIS — R0683 Snoring: Secondary | ICD-10-CM | POA: Diagnosis not present

## 2018-07-06 IMAGING — MR MR LUMBAR SPINE W/O CM
4 of 5 series · 18 of 48 positions shown · non-contrast
Comparison: Lumbar spine radiographs 11/27/2017

CLINICAL DATA: Lumbar degenerative disease.  Low back and hip pain.

EXAM:
MRI LUMBAR SPINE WITHOUT CONTRAST
TECHNIQUE: Multiplanar, multisequence MR imaging of the lumbar spine was
performed. No intravenous contrast was administered.

[Series 5: T2 · sagittal · 4.0mm · 0.73mm/px · 6 of 15 slices shown (1 of 2)]
[im 1/15]
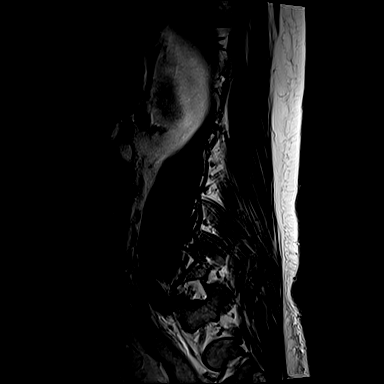
[im 3/15]
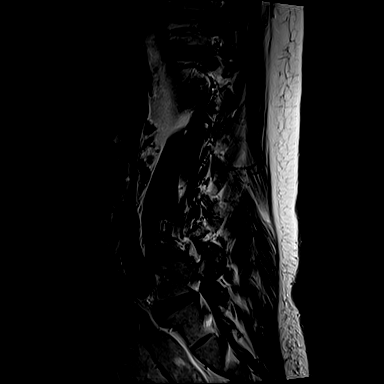
[im 6/15]
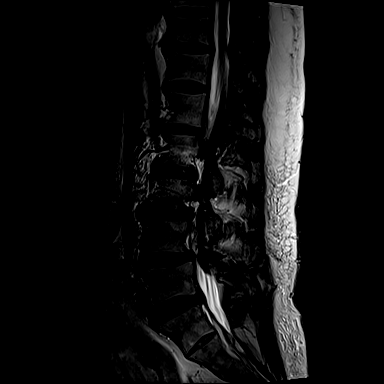
[im 9/15]
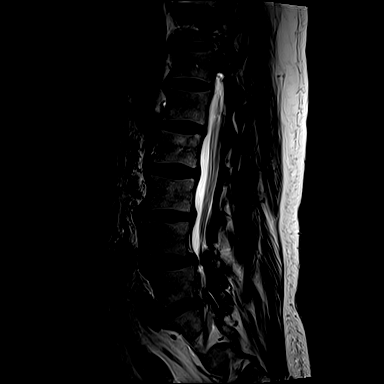
[im 12/15]
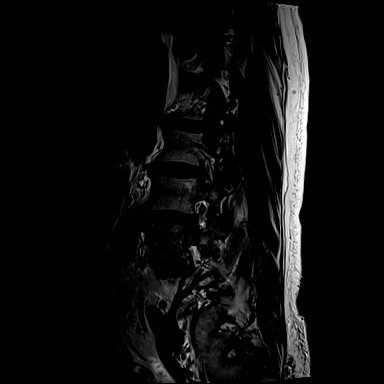
[im 15/15]
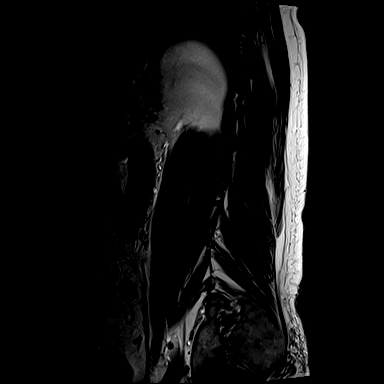

[Series 6: T1 · sagittal · 4.0mm · 0.73mm/px · 3 of 15 slices shown (1 of 2)]
[im 3/15]
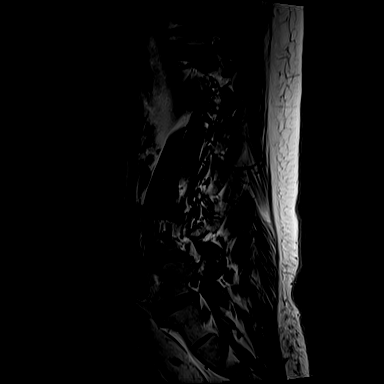
[im 9/15]
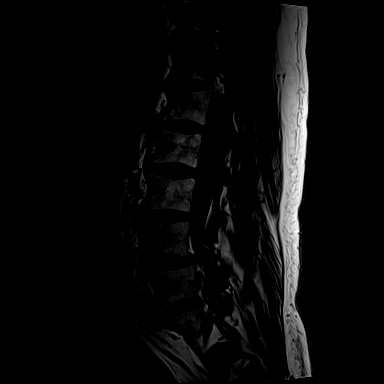
[im 15/15]
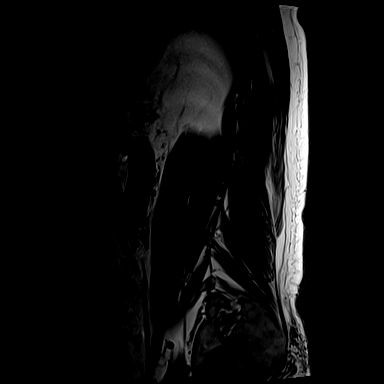

[Series 12: T2 · axial · 4.0mm · 0.28mm/px · z∈[-50,+115]mm · 6 of 38 slices shown (2 of 2)]
[im 1/38]
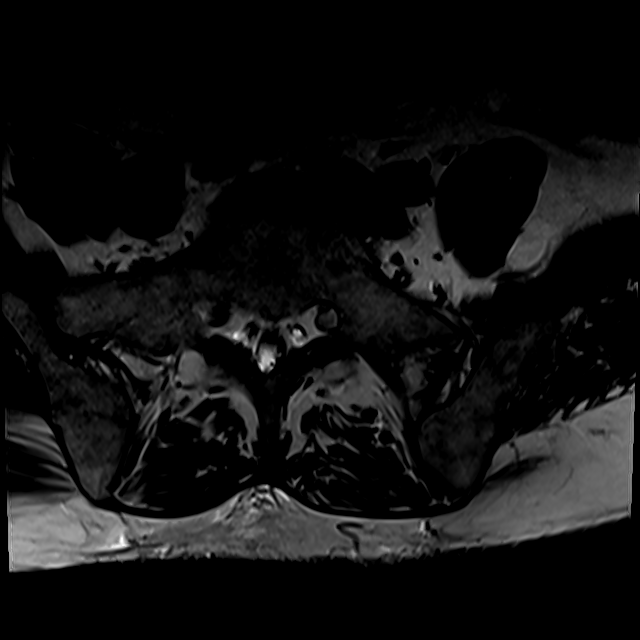
[im 6/38]
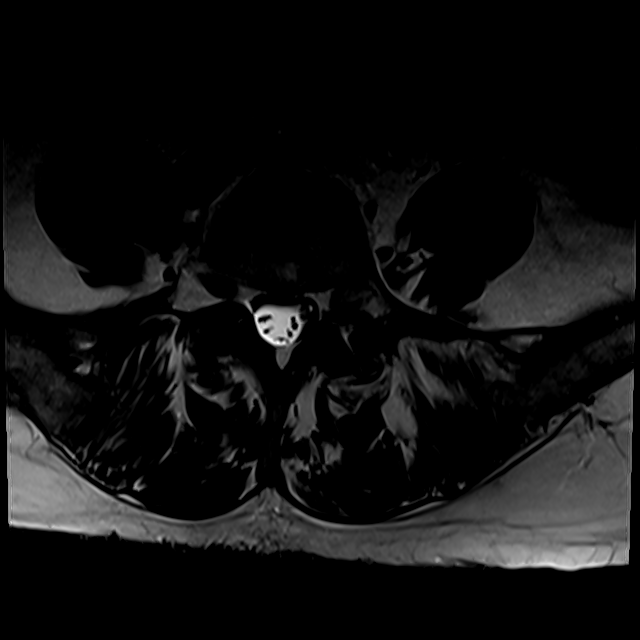
[im 11/38]
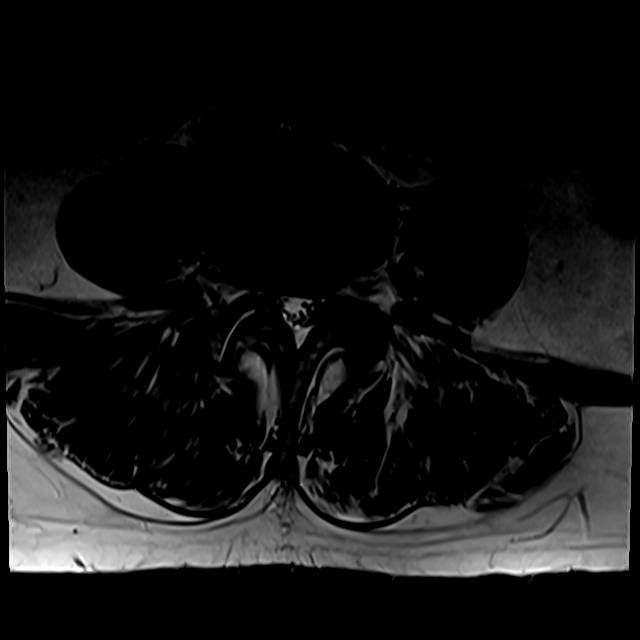
[im 16/38]
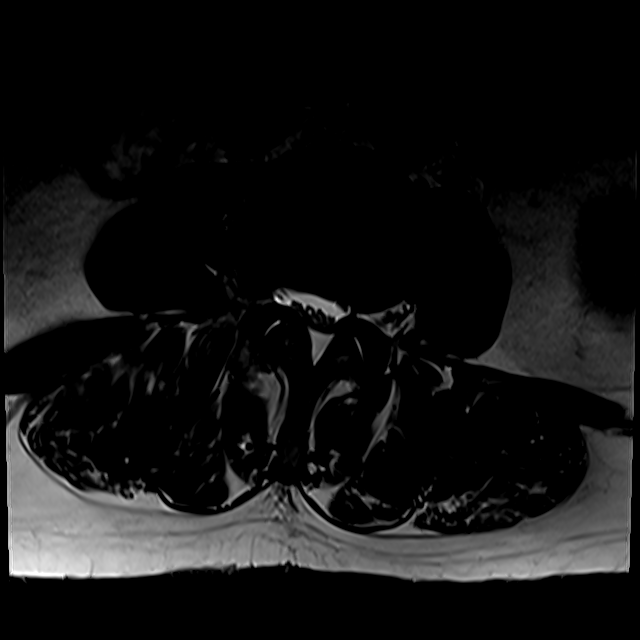
[im 19/38]
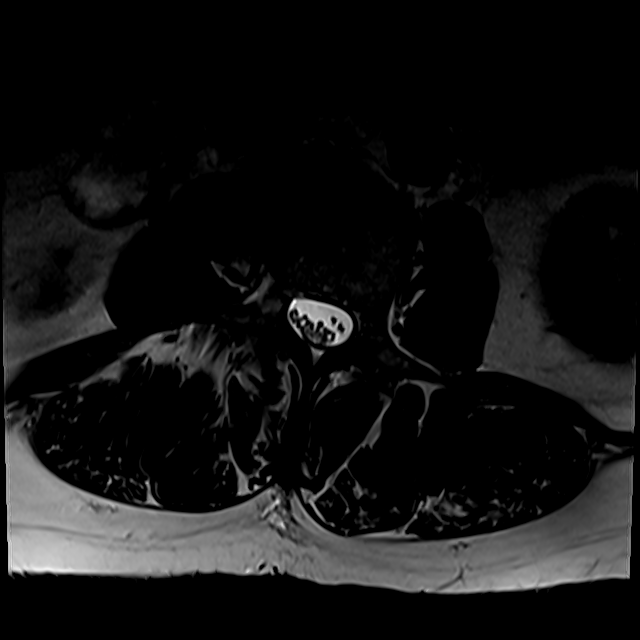
[im 32/38]
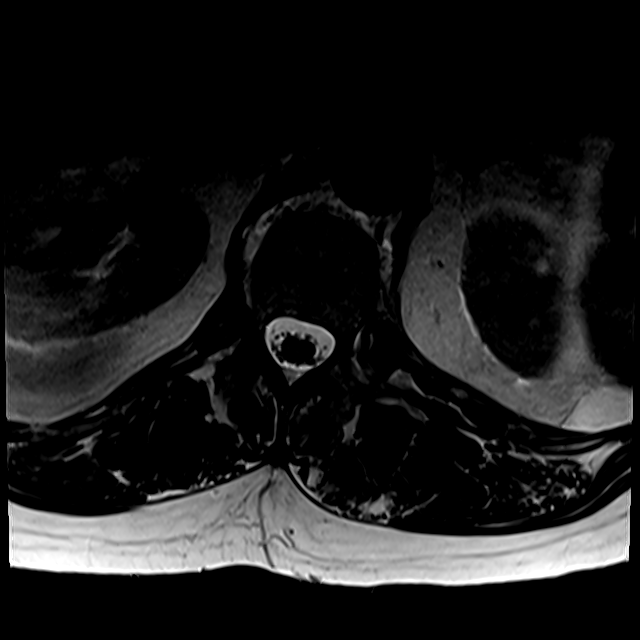

[Series 100: T1 · axial · 4.0mm · 0.28mm/px · z∈[-26,+115]mm · 3 of 38 slices shown (2 of 2)]
[im 6/38]
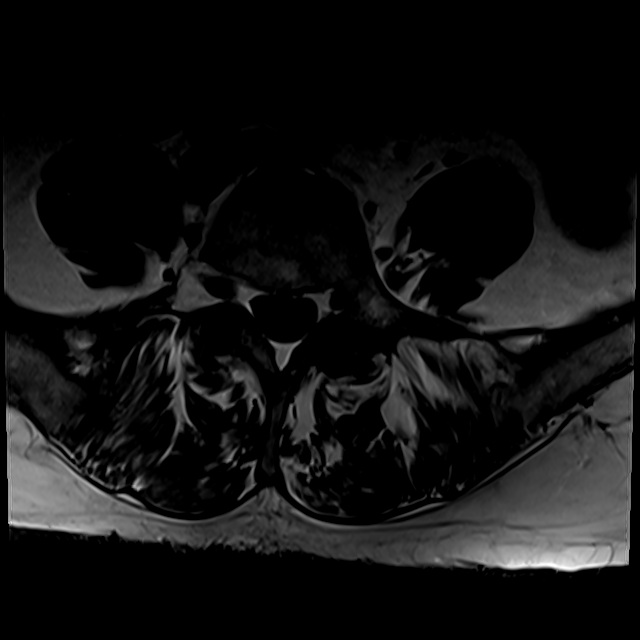
[im 19/38]
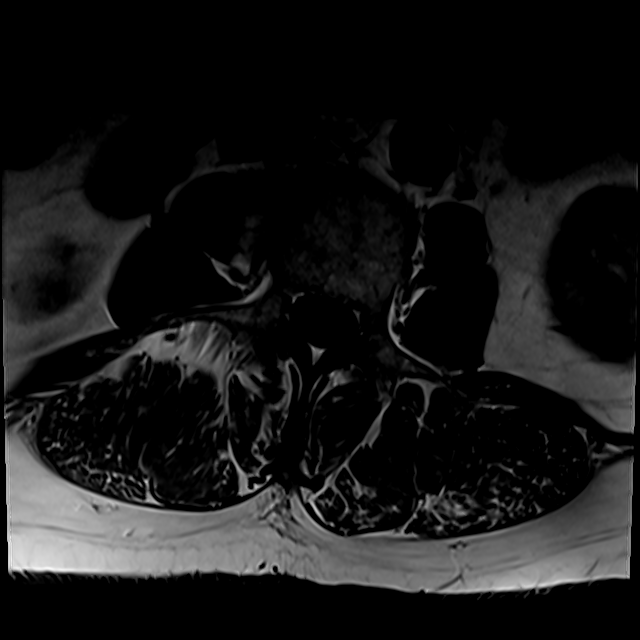
[im 32/38]
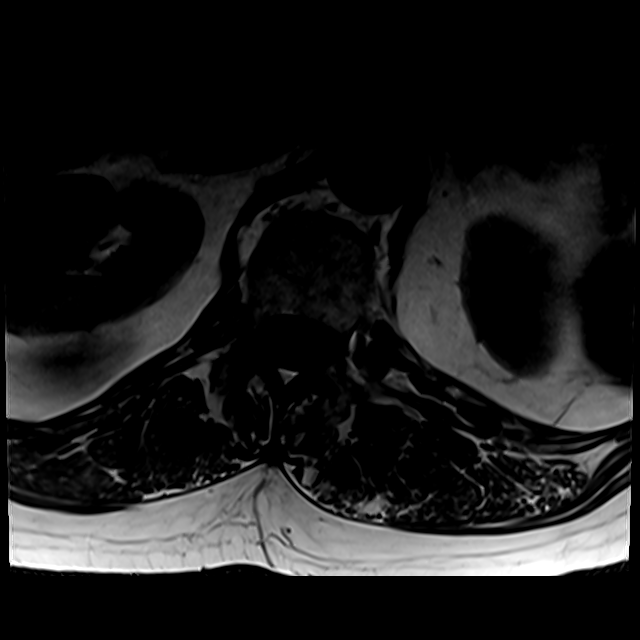

[18 of 48 positions shown; findings below may reference images not displayed]

FINDINGS: Segmentation: 5 non rib-bearing lumbar type vertebral bodies are
present.

Alignment: Grade 1 anterolisthesis is present at L4-5 measuring 6
mm. AP alignment is otherwise anatomic. Levoconvex curvature is
centered at L3.

Vertebrae: Edematous endplate marrow changes are noted on the right
at L2-3 and L3-4.

Conus medullaris and cauda equina: Conus extends to the L2 level.
Conus and cauda equina appear normal.

Paraspinal and other soft tissues: A prominent posterior right renal
cyst is incompletely imaged. Multiple bilateral cysts are present.
No definite solid lesions are noted. There is no significant
adenopathy.

Disc levels:

L1-2: Mild disc bulging and facet hypertrophy is present without
significant stenosis.

L2-3:  Negative.

L3-4: A rightward disc protrusion is present. Mild subarticular and
foraminal narrowing is present on the right.

L4-5: There is uncovering of a broad-based disc protrusion. Advanced
facet hypertrophy is present bilaterally. Spurring is worse on the
left. Moderate left and mild right subarticular narrowing is
present. Mild foraminal stenosis is present bilaterally.

L5-S1: A broad-based disc protrusion is present. Moderate
subarticular narrowing is present bilaterally. Mild foraminal
narrowing is present bilaterally.
IMPRESSION: 1. Advanced facet hypertrophy and grade 1 anterolisthesis at L4-5
with uncovering of a broad-based disc protrusion leads to moderate
left and mild right subarticular narrowing and mild bilateral
foraminal stenosis.
2. Moderate subarticular and mild foraminal narrowing bilaterally at
L5-S1 secondary to a broad-based disc protrusion and moderate facet
hypertrophy.
3. Mild subarticular and foraminal stenosis at L3-4 on the right
secondary to a rightward disc protrusion.

## 2018-07-12 NOTE — Procedures (Signed)
   NAME: [name] DATE OF BIRTH:  1940/12/20 MEDICAL RECORD NUMBER [id number]  LOCATION: [facility]  PHYSICIAN: Marius Ditch  DATE OF STUDY: 07/06/2018  SLEEP STUDY TYPE: Nocturnal Polysomnogram               REFERRING PHYSICIAN: Marius Ditch, MD  INDICATION FOR STUDY: snoring, excessive daytime sleepiness  EPWORTH SLEEPINESS SCORE:  not available HEIGHT: 5\' 4"  (162.6 cm)  WEIGHT: 179 lb (81.2 kg)    Body mass index is 30.73 kg/m.  NECK SIZE: 15.5 in.  MEDICATIONS  Patient self administered medications include: N/A. Medications administered during study include No sleep medicine administered.[name]   SLEEP STUDY TECHNIQUE  A multi-channel overnight Polysomnography study was performed. The channels recorded and monitored were central and occipital EEG, electrooculogram (EOG), submentalis EMG (chin), nasal and oral airflow, thoracic and abdominal wall motion, anterior tibialis EMG, snore microphone, electrocardiogram, and a pulse oximetry.   TECHNICAL COMMENTS  Comments added by Technician: Patient was restless all through the night. Comments added by Scorer: N/A   SLEEP ARCHITECTURE  The study was initiated at 10:59:20 PM and terminated at 5:23:26 AM. The total recorded time was 384.1 minutes. EEG confirmed total sleep time was 222.5 minutes yielding a sleep efficiency of 57.9%%. Sleep onset after lights out was 36.5 minutes with a REM latency of 164.5 minutes. The patient spent 21.6%% of the night in stage N1 sleep, 67.6%% in stage N2 sleep, 0.0%% in stage N3 and 10.79% in REM. Wake after sleep onset (WASO) was 125.1 minutes. The Arousal Index was 29.9/hour.   RESPIRATORY PARAMETERS  There were a total of 20 respiratory disturbances out of which 1 were apneas ( 0 obstructive, 0 mixed, 1 central) and 19 hypopneas. The apnea/hypopnea index (AHI) was 5.4 events/hour. The central sleep apnea index was 0.3 events/hour. The REM AHI was 35.0 events/hour and NREM  AHI was 1.8 events/hour. Respiratory Disturbance Index (RDI) was 17/hr and REM RDI was 50/hr. Respiratory disturbances were associated with oxygen desaturation down to a nadir of 84.0% during sleep. The mean oxygen saturation during the study was 90.1%. The cumulative time under 88% oxygen saturation was 5.5 minutes.  LEG MOVEMENT DATA  The total leg movements were 287 with a resulting leg movement index of 77.4/hr . Associated arousal with leg movement index was 5.9/hr.   CARDIAC DATA  The underlying cardiac rhythm was most consistent with sinus rhythm. Mean heart rate during sleep was 60.7 bpm. Additional rhythm abnormalities include PVCs.   IMPRESSIONS  Mild Obstructive Sleep apnea(OSA) by AHI; Moderate OSA by RDI  DIAGNOSIS  Obstructive Sleep Apnea (327.23 [G47.33 ICD-10])  RECOMMENDATIONS  Although OSA is mild by AHI, it is moderate by RDI, so treatment should be strongly considered.    Marius Ditch Sleep specialist, [place] Board of Internal Medicine  ELECTRONICALLY SIGNED ON:  07/12/2018, 8:52 PM [place] PH: [phone]   FX: [phone] [place]

## 2018-07-16 ENCOUNTER — Ambulatory Visit
Admission: RE | Admit: 2018-07-16 | Discharge: 2018-07-16 | Disposition: A | Discharge status: Home / Self Care | Payer: Medicare Other | Source: Ambulatory Visit | Attending: Family Medicine | Admitting: Family Medicine

## 2018-07-16 DIAGNOSIS — Z1231 Encounter for screening mammogram for malignant neoplasm of breast: Secondary | ICD-10-CM | POA: Diagnosis not present

## 2018-07-22 DIAGNOSIS — M4692 Unspecified inflammatory spondylopathy, cervical region: Secondary | ICD-10-CM | POA: Diagnosis not present

## 2018-07-24 ENCOUNTER — Encounter (HOSPITAL_COMMUNITY): Payer: Self-pay | Admitting: Emergency Medicine

## 2018-07-24 ENCOUNTER — Emergency Department (HOSPITAL_COMMUNITY)
Admission: EM | Admit: 2018-07-24 | Discharge: 2018-07-24 | Disposition: A | Discharge status: Home / Self Care | Payer: Medicare Other | Attending: Emergency Medicine | Admitting: Emergency Medicine

## 2018-07-24 ENCOUNTER — Other Ambulatory Visit: Payer: Self-pay

## 2018-07-24 DIAGNOSIS — Z87891 Personal history of nicotine dependence: Secondary | ICD-10-CM | POA: Insufficient documentation

## 2018-07-24 DIAGNOSIS — J449 Chronic obstructive pulmonary disease, unspecified: Secondary | ICD-10-CM | POA: Insufficient documentation

## 2018-07-24 DIAGNOSIS — Z7982 Long term (current) use of aspirin: Secondary | ICD-10-CM | POA: Diagnosis not present

## 2018-07-24 DIAGNOSIS — M542 Cervicalgia: Secondary | ICD-10-CM | POA: Insufficient documentation

## 2018-07-24 DIAGNOSIS — I10 Essential (primary) hypertension: Secondary | ICD-10-CM | POA: Diagnosis not present

## 2018-07-24 DIAGNOSIS — Z96659 Presence of unspecified artificial knee joint: Secondary | ICD-10-CM | POA: Insufficient documentation

## 2018-07-24 DIAGNOSIS — Z8551 Personal history of malignant neoplasm of bladder: Secondary | ICD-10-CM | POA: Insufficient documentation

## 2018-07-24 DIAGNOSIS — Z79899 Other long term (current) drug therapy: Secondary | ICD-10-CM | POA: Insufficient documentation

## 2018-07-24 MED ORDER — METHOCARBAMOL 500 MG PO TABS: 500.0000 mg | ORAL_TABLET | Freq: Two times a day (BID) | ORAL | 0 refills | Status: DC | PRN

## 2018-07-24 MED ORDER — HYDROMORPHONE HCL 1 MG/ML IJ SOLN
1.0000 mg | Freq: Once | INTRAMUSCULAR | Status: AC
  Administered 2018-07-24: 1 mg via INTRAMUSCULAR
  Filled 2018-07-24: qty 1

## 2018-07-24 MED ORDER — LIDOCAINE 5 % EX PTCH: 1.0000 | MEDICATED_PATCH | CUTANEOUS | 0 refills | Status: DC

## 2018-07-24 NOTE — ED Triage Notes (Signed)
Pt c/o neck pain that is now radiating to right shoulder area. since Thursday patient takiend steroids and other medications she was prescribed by orthopedic then Friday was started on pain medication-Norco, that isnt helping either. Pt reports has existing issues with her neck so no injuries or falls to cause pain.

## 2018-07-24 NOTE — Discharge Instructions (Addendum)
Continue taking your medications as prescribed. Use muscle relaxer as needed for continued pain.  Have caution, as this may make you tired or groggy. Use lidocaine patches as needed for continued pain. It is very important that you follow-up with orthopedic doctor for further evaluation management of your pain. Return to the emergency room if you develop numbness of your arm, you are dropping things with your hand, you develop high fevers, rash, inability to move your head from side to side, or any new or concerning symptoms.

## 2018-07-24 NOTE — ED Provider Notes (Signed)
[name]   CSN: [id number] Arrival date & time: 07/24/18  1335     History   Chief Complaint Chief Complaint  Patient presents with  . Neck Pain  . Shoulder Pain    HPI [name] is a 77 y.o. female presenting for evaluation of neck pain.  Patient states she has a history of chronic neck pain.  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  She recently moved from [place], has not establish permanent care with an orthopedic doctor here in [place].  She states that she intermittently gets flares of her neck pain, usually responds well to spinal injections.  She states she started to have increased pain of her neck 4 days ago.  She has seen orthopedics, who did an x-ray which showed bone spurs, no acute findings.  Patient was given a Decadron shot, prednisone pack, gabapentin, and Norco.  She has been taking it without improvement of her symptoms.  Pain is present when she is sitting and standing, no pain at rest.  She is able to move her head from side to side while at rest without pain.  She denies radiation of the pain into her hand.  She denies numbness or tingling of her arm.  She denies fall, trauma, or injury.  Denies fevers, chills, rash, chest pain, shortness of breath, nausea, vomiting, abdominal pain, loss of bowel or bladder control, numbness or tingling.    HPI  Past Medical History:  Diagnosis Date  . 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  . Arthritis   . Asymptomatic coronary heart disease 11/20/2017  . Cancer [facility]) 2004   bladder   . COPD (chronic obstructive pulmonary disease) ([place])   . Essential hypertension 11/20/2017  . Hypertension   . OAB (overactive bladder)   . Smoker   . Tobacco abuse 11/20/2017    Patient Active Problem List   Diagnosis Date Noted  . 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 Essential  hypertension 11/20/2017  . Asymptomatic coronary heart disease 11/20/2017  . Tobacco abuse 11/20/2017    Past Surgical History:  Procedure Laterality Date  . BLADDER SURGERY    . CHOLECYSTECTOMY    . FOOT ARTHRODESIS Left 07/02/2017   Procedure: Left Subtalar Arthrodesis;  Surgeon: Wylene Simmer, MD;  Location: [place];  Service: Orthopedics;  Laterality: Left;  . FRACTURE SURGERY    . HARDWARE REMOVAL Left 04/24/2016   Procedure: REMOVAL OF DEEP IMPLANT LEFT FOOT;  Surgeon: Wylene Simmer, MD;  Location: [place];  Service: Orthopedics;  Laterality: Left;  ANESTHESIA:  REGIONAL/MONITOR ANESTHESIA  . HARDWARE REMOVAL Left 07/02/2017   Procedure: Removal of Deep Implants;  Surgeon: Wylene Simmer, MD;  Location: [facility];  Service: Orthopedics;  Laterality: Left;  . JOINT REPLACEMENT     knee  . throat polyps    . TUBAL LIGATION       OB History   None      Home Medications    Prior to Admission medications   Medication Sig Start Date End Date Taking? Authorizing Provider  aspirin EC 81 MG tablet Take 81 mg by mouth daily.   Yes [provider]  folic acid (FOLVITE) 102 MCG tablet Take 400 mcg by mouth daily.   Yes [provider]  gabapentin (NEURONTIN) 300 MG capsule Take 300 mg by mouth daily.  07/23/18  Yes [provider]  HYDROcodone-acetaminophen (NORCO/VICODIN) 5-325 MG tablet Take 1 tablet by mouth 3 (three) times daily. 07/22/18  Yes [provider]  lisinopril (PRINIVIL,ZESTRIL) 5 MG tablet Take 5 mg by mouth daily.   Yes [provider]  methylPREDNISolone (MEDROL DOSEPAK) 4 MG TBPK tablet Take 4-24 mg by mouth daily. 07/22/18  Yes [provider]  mirabegron ER (MYRBETRIQ) 50 MG TB24 tablet Take 50 mg by mouth daily.   Yes [provider]  Multiple Vitamins-Minerals (CENTRUM ULTRA WOMENS PO) Take by mouth.   Yes [provider]  umeclidinium-vilanterol (ANORO  ELLIPTA) 62.5-25 MCG/INH AEPB Inhale 1 puff into the lungs daily.   Yes [provider]  lidocaine (LIDODERM) 5 % Place 1 patch onto the skin daily. Remove & Discard patch within 12 hours or as directed by MD 07/24/18   [name], PA-C  methocarbamol (ROBAXIN) 500 MG tablet Take 1 tablet (500 mg total) by mouth 2 (two) times daily as needed for muscle spasms. 07/24/18   [name], PA-C    Family History Family History  Problem Relation Age of Onset  . Cancer Mother   . Stroke Sister     Social History Social History   Tobacco Use  . Smoking status: Former Smoker    Packs/day: 1.00    Years: 40.00    Pack years: 40.00    Types: Cigarettes  . Smokeless tobacco: Never Used  . Tobacco comment: currently trying to quit 06-26-17  Substance Use Topics  . Alcohol use: No    Alcohol/week: 0.0 standard drinks  . Drug use: No     Allergies   Rosuvastatin   Review of Systems Review of Systems  Musculoskeletal: Positive for neck pain.  All other systems reviewed and are negative.    Physical Exam Updated Vital Signs BP (!) 149/77   Pulse 69   Temp 97.7 F (36.5 C) (Oral)   Resp 17   Ht 5' 3.5" (1.613 m)   Wt 81.2 kg   SpO2 95%   BMI 31.21 kg/m   Physical Exam  Constitutional: She is oriented to person, place, and time. She appears well-developed and well-nourished. No distress.  Elderly female lying flat in the bed in no acute distress.  HENT:  Head: Normocephalic and atraumatic.  MM moist.  OP clear without tonsillar swelling or exudate.  Uvula midline with equal palate rise.  Eyes: Pupils are equal, round, and reactive to light. Conjunctivae and EOM are normal.  Neck: Normal range of motion. Neck supple.  Tenderness palpation of low cervical spine without step-offs, crepitus, or deformities.  Tenderness palpation of right-sided paraspinal muscles from cervical spine to the shoulder.  No tenderness palpation of the anterior shoulder or  collarbone.  Full active range of motion of the neck while laying flat without pain or difficulty.  Cardiovascular: Normal rate, regular rhythm and intact distal pulses.  Pulmonary/Chest: Effort normal and breath sounds normal. No respiratory distress. She has no wheezes.  Abdominal: Soft. She exhibits no distension. There is no tenderness.  Musculoskeletal: Normal range of motion.  Radial pulses intact bilaterally.  Grip strength intact bilaterally.  Full active range of motion of the arm without pain.  Sensation of upper extremity is intact bilaterally.  Good cap refill.  Neurological: She is alert and oriented to person, place, and time.  No obvious neurologic deficits.  CN intact.  Nose to finger intact.  Fine movement and coordination intact.  Skin: Skin is warm  and dry. Capillary refill takes less than 2 seconds.  Psychiatric: She has a normal mood and affect.  Nursing note and vitals reviewed.    ED Treatments / Results  Labs (all labs ordered are listed, but only abnormal results are displayed) Labs Reviewed - No data to display  EKG None  Radiology No results found.  Procedures Procedures (including critical care time)  Medications Ordered in ED Medications  HYDROmorphone (DILAUDID) injection 1 mg (1 mg Intramuscular Given 07/24/18 1541)     Initial Impression / Assessment and Plan / ED Course  I have reviewed the triage vital signs and the nursing notes.  Pertinent labs & imaging results that were available during my care of the patient were reviewed by me and considered in my medical decision making (see chart for details).     Patient presented for evaluation of neck pain.  Has a history of chronic neck pain with frequent flares.  States this feels the same as previous flares.  No signs of meningitis.  Doubt radiculopathy, is no radiation or nerve-like symptoms.  Doubt spinal cord compression.  Doubt tumor.  Doubt vertebral injury without a fall.  Likely flare of  chronic pain.  As patient is already on narcotics, will not prescribe further narcotic medication.  Will trial lidocaine patch and muscle relaxer.  Case discussed with attending, Dr. Ralene Bathe evaluated the patient.  Will give shot of dilaudid prior to discharge. Will have pt f/u with orthopedics for further evaluation and management of her neck pain.  At this time, patient appears safe for discharge.  Return precautions given.  Patient states she understands and agrees to plan.   Final Clinical Impressions(s) / ED Diagnoses   Final diagnoses:  Neck pain    ED Discharge Orders         Ordered    lidocaine (LIDODERM) 5 %  Every 24 hours     07/24/18 1528    methocarbamol (ROBAXIN) 500 MG tablet  2 times daily PRN     07/24/18 1528           Deja Kaigler, PA-C 07/24/18 1933    Quintella Reichert, MD 07/25/18 [phone]

## 2018-07-26 DIAGNOSIS — M542 Cervicalgia: Secondary | ICD-10-CM | POA: Diagnosis not present

## 2018-07-27 DIAGNOSIS — M542 Cervicalgia: Secondary | ICD-10-CM | POA: Diagnosis not present

## 2018-08-03 DIAGNOSIS — M5412 Radiculopathy, cervical region: Secondary | ICD-10-CM | POA: Diagnosis not present

## 2018-08-13 DIAGNOSIS — M5412 Radiculopathy, cervical region: Secondary | ICD-10-CM | POA: Diagnosis not present

## 2018-08-13 DIAGNOSIS — M542 Cervicalgia: Secondary | ICD-10-CM | POA: Diagnosis not present

## 2018-08-20 DIAGNOSIS — H40013 Open angle with borderline findings, low risk, bilateral: Secondary | ICD-10-CM | POA: Diagnosis not present

## 2018-08-20 DIAGNOSIS — H04123 Dry eye syndrome of bilateral lacrimal glands: Secondary | ICD-10-CM | POA: Diagnosis not present

## 2018-08-20 DIAGNOSIS — I10 Essential (primary) hypertension: Secondary | ICD-10-CM | POA: Diagnosis not present

## 2018-08-27 DIAGNOSIS — M5412 Radiculopathy, cervical region: Secondary | ICD-10-CM | POA: Diagnosis not present

## 2018-10-08 DIAGNOSIS — L821 Other seborrheic keratosis: Secondary | ICD-10-CM | POA: Diagnosis not present

## 2018-10-08 DIAGNOSIS — C44622 Squamous cell carcinoma of skin of right upper limb, including shoulder: Secondary | ICD-10-CM | POA: Diagnosis not present

## 2018-10-08 DIAGNOSIS — L570 Actinic keratosis: Secondary | ICD-10-CM | POA: Diagnosis not present

## 2018-10-08 DIAGNOSIS — X32XXXD Exposure to sunlight, subsequent encounter: Secondary | ICD-10-CM | POA: Diagnosis not present

## 2018-10-11 DIAGNOSIS — M542 Cervicalgia: Secondary | ICD-10-CM | POA: Diagnosis not present

## 2018-10-19 DIAGNOSIS — M479 Spondylosis, unspecified: Secondary | ICD-10-CM | POA: Diagnosis not present

## 2018-10-19 DIAGNOSIS — M5412 Radiculopathy, cervical region: Secondary | ICD-10-CM | POA: Diagnosis not present

## 2018-10-26 DIAGNOSIS — M479 Spondylosis, unspecified: Secondary | ICD-10-CM | POA: Diagnosis not present

## 2018-10-26 DIAGNOSIS — M5412 Radiculopathy, cervical region: Secondary | ICD-10-CM | POA: Diagnosis not present

## 2018-10-28 DIAGNOSIS — M479 Spondylosis, unspecified: Secondary | ICD-10-CM | POA: Diagnosis not present

## 2018-10-28 DIAGNOSIS — M5412 Radiculopathy, cervical region: Secondary | ICD-10-CM | POA: Diagnosis not present

## 2018-11-02 DIAGNOSIS — M479 Spondylosis, unspecified: Secondary | ICD-10-CM | POA: Diagnosis not present

## 2018-11-02 DIAGNOSIS — M5412 Radiculopathy, cervical region: Secondary | ICD-10-CM | POA: Diagnosis not present

## 2018-11-03 DIAGNOSIS — L03031 Cellulitis of right toe: Secondary | ICD-10-CM | POA: Diagnosis not present

## 2018-11-03 DIAGNOSIS — M79674 Pain in right toe(s): Secondary | ICD-10-CM | POA: Diagnosis not present

## 2018-11-03 DIAGNOSIS — L02611 Cutaneous abscess of right foot: Secondary | ICD-10-CM | POA: Diagnosis not present

## 2018-11-05 ENCOUNTER — Ambulatory Visit: Payer: Medicare Other

## 2018-11-09 DIAGNOSIS — M479 Spondylosis, unspecified: Secondary | ICD-10-CM | POA: Diagnosis not present

## 2018-11-09 DIAGNOSIS — M5412 Radiculopathy, cervical region: Secondary | ICD-10-CM | POA: Diagnosis not present

## 2018-11-17 DIAGNOSIS — B079 Viral wart, unspecified: Secondary | ICD-10-CM | POA: Diagnosis not present

## 2018-11-17 DIAGNOSIS — Z08 Encounter for follow-up examination after completed treatment for malignant neoplasm: Secondary | ICD-10-CM | POA: Diagnosis not present

## 2018-11-17 DIAGNOSIS — B078 Other viral warts: Secondary | ICD-10-CM | POA: Diagnosis not present

## 2018-11-17 DIAGNOSIS — Z85828 Personal history of other malignant neoplasm of skin: Secondary | ICD-10-CM | POA: Diagnosis not present

## 2018-11-19 DIAGNOSIS — M5412 Radiculopathy, cervical region: Secondary | ICD-10-CM | POA: Diagnosis not present

## 2018-11-19 DIAGNOSIS — M479 Spondylosis, unspecified: Secondary | ICD-10-CM | POA: Diagnosis not present

## 2018-11-23 DIAGNOSIS — L03031 Cellulitis of right toe: Secondary | ICD-10-CM | POA: Diagnosis not present

## 2018-11-24 DIAGNOSIS — R42 Dizziness and giddiness: Secondary | ICD-10-CM | POA: Diagnosis not present

## 2018-11-26 DIAGNOSIS — M5412 Radiculopathy, cervical region: Secondary | ICD-10-CM | POA: Diagnosis not present

## 2018-11-26 DIAGNOSIS — M479 Spondylosis, unspecified: Secondary | ICD-10-CM | POA: Diagnosis not present

## 2018-12-10 DIAGNOSIS — M479 Spondylosis, unspecified: Secondary | ICD-10-CM | POA: Diagnosis not present

## 2018-12-10 DIAGNOSIS — M5412 Radiculopathy, cervical region: Secondary | ICD-10-CM | POA: Diagnosis not present

## 2019-01-10 DIAGNOSIS — I10 Essential (primary) hypertension: Secondary | ICD-10-CM | POA: Diagnosis not present

## 2019-01-10 DIAGNOSIS — H40013 Open angle with borderline findings, low risk, bilateral: Secondary | ICD-10-CM | POA: Diagnosis not present

## 2019-01-10 DIAGNOSIS — H04123 Dry eye syndrome of bilateral lacrimal glands: Secondary | ICD-10-CM | POA: Diagnosis not present

## 2019-02-04 DIAGNOSIS — R1084 Generalized abdominal pain: Secondary | ICD-10-CM | POA: Diagnosis not present

## 2019-02-04 DIAGNOSIS — Z8719 Personal history of other diseases of the digestive system: Secondary | ICD-10-CM | POA: Diagnosis not present

## 2019-02-04 DIAGNOSIS — R197 Diarrhea, unspecified: Secondary | ICD-10-CM | POA: Diagnosis not present

## 2019-02-04 DIAGNOSIS — R112 Nausea with vomiting, unspecified: Secondary | ICD-10-CM | POA: Diagnosis not present

## 2019-02-08 DIAGNOSIS — R197 Diarrhea, unspecified: Secondary | ICD-10-CM | POA: Diagnosis not present

## 2019-02-14 DIAGNOSIS — F17290 Nicotine dependence, other tobacco product, uncomplicated: Secondary | ICD-10-CM | POA: Diagnosis not present

## 2019-02-14 DIAGNOSIS — R42 Dizziness and giddiness: Secondary | ICD-10-CM | POA: Diagnosis not present

## 2019-02-14 DIAGNOSIS — Z972 Presence of dental prosthetic device (complete) (partial): Secondary | ICD-10-CM | POA: Diagnosis not present

## 2019-02-16 ENCOUNTER — Other Ambulatory Visit: Payer: Self-pay | Admitting: Physician Assistant

## 2019-02-16 DIAGNOSIS — Z8719 Personal history of other diseases of the digestive system: Secondary | ICD-10-CM | POA: Diagnosis not present

## 2019-02-16 DIAGNOSIS — R740 Nonspecific elevation of levels of transaminase and lactic acid dehydrogenase [LDH]: Secondary | ICD-10-CM | POA: Diagnosis not present

## 2019-02-16 DIAGNOSIS — R7989 Other specified abnormal findings of blood chemistry: Secondary | ICD-10-CM

## 2019-02-16 DIAGNOSIS — R945 Abnormal results of liver function studies: Secondary | ICD-10-CM | POA: Diagnosis not present

## 2019-02-18 ENCOUNTER — Ambulatory Visit
Admission: RE | Admit: 2019-02-18 | Discharge: 2019-02-18 | Disposition: A | Discharge status: Home / Self Care | Payer: Medicare Other | Source: Ambulatory Visit | Attending: Physician Assistant | Admitting: Physician Assistant

## 2019-02-18 DIAGNOSIS — R945 Abnormal results of liver function studies: Principal | ICD-10-CM

## 2019-02-18 DIAGNOSIS — K760 Fatty (change of) liver, not elsewhere classified: Secondary | ICD-10-CM | POA: Diagnosis not present

## 2019-02-18 DIAGNOSIS — R7989 Other specified abnormal findings of blood chemistry: Secondary | ICD-10-CM

## 2019-02-21 ENCOUNTER — Other Ambulatory Visit: Payer: Medicare Other

## 2019-02-22 DIAGNOSIS — M545 Low back pain: Secondary | ICD-10-CM | POA: Diagnosis not present

## 2019-02-22 DIAGNOSIS — M5416 Radiculopathy, lumbar region: Secondary | ICD-10-CM | POA: Diagnosis not present

## 2019-02-23 DIAGNOSIS — G894 Chronic pain syndrome: Secondary | ICD-10-CM | POA: Diagnosis not present

## 2019-02-26 DIAGNOSIS — M545 Low back pain: Secondary | ICD-10-CM | POA: Diagnosis not present

## 2019-03-04 DIAGNOSIS — M545 Low back pain: Secondary | ICD-10-CM | POA: Diagnosis not present

## 2019-04-18 DIAGNOSIS — M79674 Pain in right toe(s): Secondary | ICD-10-CM | POA: Diagnosis not present

## 2019-04-18 DIAGNOSIS — M2042 Other hammer toe(s) (acquired), left foot: Secondary | ICD-10-CM | POA: Diagnosis not present

## 2019-04-18 DIAGNOSIS — M2041 Other hammer toe(s) (acquired), right foot: Secondary | ICD-10-CM | POA: Diagnosis not present

## 2019-04-18 DIAGNOSIS — L03031 Cellulitis of right toe: Secondary | ICD-10-CM | POA: Diagnosis not present

## 2019-04-27 DIAGNOSIS — M2041 Other hammer toe(s) (acquired), right foot: Secondary | ICD-10-CM | POA: Diagnosis not present

## 2019-04-27 DIAGNOSIS — M2042 Other hammer toe(s) (acquired), left foot: Secondary | ICD-10-CM | POA: Diagnosis not present

## 2019-04-27 DIAGNOSIS — M79674 Pain in right toe(s): Secondary | ICD-10-CM | POA: Diagnosis not present

## 2019-04-27 DIAGNOSIS — L03031 Cellulitis of right toe: Secondary | ICD-10-CM | POA: Diagnosis not present

## 2019-05-11 DIAGNOSIS — L03031 Cellulitis of right toe: Secondary | ICD-10-CM | POA: Diagnosis not present

## 2019-05-11 DIAGNOSIS — M2041 Other hammer toe(s) (acquired), right foot: Secondary | ICD-10-CM | POA: Diagnosis not present

## 2019-05-18 DIAGNOSIS — R945 Abnormal results of liver function studies: Secondary | ICD-10-CM | POA: Diagnosis not present

## 2019-05-18 DIAGNOSIS — Z8719 Personal history of other diseases of the digestive system: Secondary | ICD-10-CM | POA: Diagnosis not present

## 2019-05-18 DIAGNOSIS — K760 Fatty (change of) liver, not elsewhere classified: Secondary | ICD-10-CM | POA: Diagnosis not present

## 2019-05-18 DIAGNOSIS — Z86010 Personal history of colonic polyps: Secondary | ICD-10-CM | POA: Diagnosis not present

## 2019-05-20 DIAGNOSIS — R945 Abnormal results of liver function studies: Secondary | ICD-10-CM | POA: Diagnosis not present

## 2019-05-26 DIAGNOSIS — M2042 Other hammer toe(s) (acquired), left foot: Secondary | ICD-10-CM | POA: Diagnosis not present

## 2019-05-26 DIAGNOSIS — M2041 Other hammer toe(s) (acquired), right foot: Secondary | ICD-10-CM | POA: Diagnosis not present

## 2019-06-02 DIAGNOSIS — M2041 Other hammer toe(s) (acquired), right foot: Secondary | ICD-10-CM | POA: Diagnosis not present

## 2019-07-19 DIAGNOSIS — C670 Malignant neoplasm of trigone of bladder: Secondary | ICD-10-CM | POA: Diagnosis not present

## 2019-07-19 DIAGNOSIS — N816 Rectocele: Secondary | ICD-10-CM | POA: Diagnosis not present

## 2019-07-19 DIAGNOSIS — N3941 Urge incontinence: Secondary | ICD-10-CM | POA: Diagnosis not present

## 2019-07-21 DIAGNOSIS — R42 Dizziness and giddiness: Secondary | ICD-10-CM | POA: Diagnosis not present

## 2019-07-21 DIAGNOSIS — R2689 Other abnormalities of gait and mobility: Secondary | ICD-10-CM | POA: Diagnosis not present

## 2019-07-25 DIAGNOSIS — N3000 Acute cystitis without hematuria: Secondary | ICD-10-CM | POA: Diagnosis not present

## 2019-07-25 DIAGNOSIS — N3941 Urge incontinence: Secondary | ICD-10-CM | POA: Diagnosis not present

## 2019-08-04 DIAGNOSIS — H8111 Benign paroxysmal vertigo, right ear: Secondary | ICD-10-CM | POA: Diagnosis not present

## 2019-08-04 DIAGNOSIS — H903 Sensorineural hearing loss, bilateral: Secondary | ICD-10-CM | POA: Diagnosis not present

## 2019-08-08 DIAGNOSIS — B373 Candidiasis of vulva and vagina: Secondary | ICD-10-CM | POA: Diagnosis not present

## 2019-08-08 DIAGNOSIS — N3000 Acute cystitis without hematuria: Secondary | ICD-10-CM | POA: Diagnosis not present

## 2019-08-10 ENCOUNTER — Other Ambulatory Visit: Payer: Self-pay | Admitting: Family Medicine

## 2019-08-10 DIAGNOSIS — Z1231 Encounter for screening mammogram for malignant neoplasm of breast: Secondary | ICD-10-CM

## 2019-08-16 DIAGNOSIS — X32XXXD Exposure to sunlight, subsequent encounter: Secondary | ICD-10-CM | POA: Diagnosis not present

## 2019-08-16 DIAGNOSIS — L570 Actinic keratosis: Secondary | ICD-10-CM | POA: Diagnosis not present

## 2019-08-16 DIAGNOSIS — L821 Other seborrheic keratosis: Secondary | ICD-10-CM | POA: Diagnosis not present

## 2019-08-16 DIAGNOSIS — Z1283 Encounter for screening for malignant neoplasm of skin: Secondary | ICD-10-CM | POA: Diagnosis not present

## 2019-08-16 DIAGNOSIS — L820 Inflamed seborrheic keratosis: Secondary | ICD-10-CM | POA: Diagnosis not present

## 2019-08-31 ENCOUNTER — Encounter: Payer: Self-pay | Admitting: *Deleted

## 2019-09-01 ENCOUNTER — Ambulatory Visit: Payer: Medicare Other | Admitting: Cardiovascular Disease

## 2019-09-05 ENCOUNTER — Encounter: Payer: Self-pay | Admitting: Diagnostic Neuroimaging

## 2019-09-05 ENCOUNTER — Ambulatory Visit (INDEPENDENT_AMBULATORY_CARE_PROVIDER_SITE_OTHER): Payer: Medicare Other | Admitting: Diagnostic Neuroimaging

## 2019-09-05 ENCOUNTER — Other Ambulatory Visit: Payer: Self-pay

## 2019-09-05 VITALS — BP 112/68 | HR 65 | Temp 97.8°F | Ht 63.5 in | Wt 179.8 lb

## 2019-09-05 DIAGNOSIS — R269 Unspecified abnormalities of gait and mobility: Secondary | ICD-10-CM | POA: Diagnosis not present

## 2019-09-05 DIAGNOSIS — G3281 Cerebellar ataxia in diseases classified elsewhere: Secondary | ICD-10-CM | POA: Diagnosis not present

## 2019-09-05 DIAGNOSIS — R42 Dizziness and giddiness: Secondary | ICD-10-CM

## 2019-09-05 NOTE — Progress Notes (Signed)
[facility]  PATIENT: [name] DOB: 08/28/1941  REFERRING CLINICIAN: C White HISTORY FROM: patient and husband REASON FOR VISIT: new consult    HISTORICAL  CHIEF COMPLAINT:  Chief Complaint  Patient presents with  . Gait Problem    rm 7 New Pt, husband- Robert "vertigo, imbalance"     HISTORY OF PRESENT ILLNESS:   78 year old female here for evaluation of balance difficulty, falls, vertigo, lightheadedness, dizziness.  History of low back pain, left knee surgery, left ankle surgery, scoliosis.  Patient has had problems with her left hip, knee and ankle since 2012.  She had multiple surgeries of the left knee and ankle around 2013.  Ever since that time she has had problems with her left leg strength, balance and coordination.  This has worsened over the past year.  Patient sometimes suddenly falls down when she is standing or turns quickly.  She has a cane and walker at home but does not use them frequently.  Patient also having intermittent vertigo, room spinning sensation is associated with nausea, randomly.  No consistent positional maneuver trigger symptoms.  Symptoms can last a few minutes at a time.  She uses meclizine which slightly helps.  Patient also having intermittent lightheadedness and faint feeling.  She had several episodes of recurrent syncope in 2016 and earlier, had complete cardiac and medical work-up with no specific cause found.  Intermittent lightheadedness and faint feeling continue.  No specific triggering factors.    REVIEW OF SYSTEMS: Full 14 system review of systems performed and negative with exception of: As per HPI.  Shortness of breath fatigue spinning sensation joint pain dizziness decreased energy.  ALLERGIES: Allergies  Allergen Reactions  . Rosuvastatin     myalgia    HOME MEDICATIONS: Outpatient Medications Prior to Visit  Medication Sig Dispense Refill  . aspirin EC 81 MG tablet Take 81 mg by mouth daily.     . folic acid (FOLVITE) A999333 MCG tablet Take 400 mcg by mouth daily.    [name] lidocaine (LIDODERM) 5 % Place 1 patch onto the skin daily. Remove & Discard patch within 12 hours or as directed by MD 30 patch 0  . lisinopril (PRINIVIL,ZESTRIL) 5 MG tablet Take 5 mg by mouth daily.    . methocarbamol (ROBAXIN) 500 MG tablet Take 1 tablet (500 mg total) by mouth 2 (two) times daily as needed for muscle spasms. 10 tablet 0  . mirabegron ER (MYRBETRIQ) 50 MG TB24 tablet Take 50 mg by mouth daily.    . Multiple Vitamins-Minerals (CENTRUM ULTRA WOMENS PO) Take by mouth.    . umeclidinium-vilanterol (ANORO ELLIPTA) 62.5-25 MCG/INH AEPB Inhale 1 puff into the lungs daily.    [name] HYDROcodone-acetaminophen (NORCO/VICODIN) 5-325 MG tablet Take 1 tablet by mouth 3 (three) times daily.  0  . gabapentin (NEURONTIN) 300 MG capsule Take 300 mg by mouth daily.  0  . methylPREDNISolone (MEDROL DOSEPAK) 4 MG TBPK tablet Take 4-24 mg by mouth daily.  0   No facility-administered medications prior to visit.     PAST MEDICAL HISTORY: Past Medical History:  Diagnosis Date  . 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  . Arthritis   . Asymptomatic coronary heart disease 11/20/2017  . 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 COPD (chronic obstructive pulmonary disease) ([place])   . Crohn's disease ([place])   . Essential hypertension 11/20/2017  . Gait difficulty   . OAB (overactive bladder)   .  Smoker   . Tobacco abuse 11/20/2017  . Vertigo     PAST SURGICAL HISTORY: Past Surgical History:  Procedure Laterality Date  . BLADDER SURGERY    . CHOLECYSTECTOMY    . FOOT ARTHRODESIS Left 07/02/2017   Procedure: Left Subtalar Arthrodesis;  Surgeon: Wylene Simmer, MD;  Location: [place];  Service: Orthopedics;  Laterality: Left;  . FRACTURE SURGERY    . HARDWARE REMOVAL Left 04/24/2016   Procedure: REMOVAL OF DEEP IMPLANT LEFT FOOT;  Surgeon: Wylene Simmer, MD;  Location: [facility];  Service: Orthopedics;  Laterality: Left;  ANESTHESIA:  REGIONAL/MONITOR ANESTHESIA  . HARDWARE REMOVAL Left 07/02/2017   Procedure: Removal of Deep Implants;  Surgeon: Wylene Simmer, MD;  Location: [facility];  Service: Orthopedics;  Laterality: Left;  . JOINT REPLACEMENT     knee  . throat polyps    . TUBAL LIGATION      FAMILY HISTORY: Family History  Problem Relation Age of Onset  . Cancer Mother   . Stroke Sister     SOCIAL HISTORY: Social History   Socioeconomic History  . Marital status: Married    Spouse name: Herbie Baltimore  . Number of children: 2  . Years of education: Not on file  . Highest education level: GED or equivalent  Occupational History  . Not on file  Social Needs  . Financial resource strain: Not on file  . Food insecurity    Worry: Not on file    Inability: Not on file  . Transportation needs    Medical: Not on file    Non-medical: Not on file  Tobacco Use  . Smoking status: Former Smoker    Packs/day: 1.00    Years: 40.00    Pack years: 40.00    Types: Cigarettes, E-cigarettes  . Smokeless tobacco: Never Used  . Tobacco comment: currently trying to quit 06-26-17  Substance and Sexual Activity  . Alcohol use: No    Alcohol/week: 0.0 standard drinks  . Drug use: No  . Sexual activity: Not on file  Lifestyle  . Physical activity    Days per week: Not on file    Minutes per session: Not on file  . Stress: Not on file  Relationships  . Social Herbalist on phone: Not on file    Gets together: Not on file    Attends religious service: Not on file    Active member of club or organization: Not on file    Attends meetings of clubs or organizations: Not on file    Relationship status: Not on file  . Intimate partner violence    Fear of current or ex partner: Not on file    Emotionally abused: Not on file    Physically abused: Not on file    Forced sexual activity: Not on file  Other Topics Concern  . Not on file   Social History Narrative   Lives with husband, 2 children   Caffeine- coffee 1 cup daily     PHYSICAL EXAM  GENERAL EXAM/CONSTITUTIONAL: Vitals:  Vitals:   09/05/19 1031  BP: 112/68  Pulse: 65  Temp: 97.8 F (36.6 C)  Weight: 179 lb 12.8 oz (81.6 kg)  Height: 5' 3.5" (1.613 m)     Body mass index is 31.35 kg/m. Wt Readings from Last 3 Encounters:  09/05/19 179 lb 12.8 oz (81.6 kg)  07/24/18 179 lb (81.2 kg)  07/06/18 179 lb (81.2 kg)  Patient is in no distress; well developed, nourished and groomed; neck is supple  CARDIOVASCULAR:  Examination of carotid arteries is normal; no carotid bruits  Regular rate and rhythm, no murmurs  Examination of peripheral vascular system by observation and palpation is normal  EYES:  Ophthalmoscopic exam of optic discs and posterior segments is normal; no papilledema or hemorrhages  No exam data present  MUSCULOSKELETAL:  Gait, strength, tone, movements noted in Neurologic exam below  NEUROLOGIC: MENTAL STATUS:  No flowsheet data found.  awake, alert, oriented to person, place and time  recent and remote memory intact  normal attention and concentration  language fluent, comprehension intact, naming intact  fund of knowledge appropriate  CRANIAL NERVE:   2nd - no papilledema on fundoscopic exam  2nd, 3rd, 4th, 6th - pupils equal and reactive to light, visual fields full to confrontation, extraocular muscles intact, no nystagmus  5th - facial sensation symmetric  7th - facial strength symmetric  8th - hearing intact  9th - palate elevates symmetrically, uvula midline  11th - shoulder shrug symmetric  12th - tongue protrusion midline  MOTOR:   normal bulk and tone, full strength in the RUE, RLE  LEFT DELTOID LIMITED BY PAIN; OTHERWISE 5  LLE (HIP FLEX 4, KNEE EXT 4, KNEE FLEX 3, DF 4)  SENSORY:   normal and symmetric to light touch, temperature, vibration; DECR IN LEFT FOOT  COORDINATION:    finger-nose-finger, fine finger movements SLOW  REFLEXES:   deep tendon reflexes TRACE and symmetric  GAIT/STATION:   narrow based gait; LIMPING ON LEFT LEG; SLOW TO STAND; CAUTIOUS TURNS; UNSTEADY     DIAGNOSTIC DATA (LABS, IMAGING, TESTING) - I reviewed patient records, labs, notes, testing and imaging myself where available.  No results found for: WBC, HGB, HCT, MCV, PLT    Component Value Date/Time   NA 141 11/26/2017 0920   K 5.2 11/26/2017 0920   CL 101 11/26/2017 0920   CO2 26 11/26/2017 0920   GLUCOSE 115 (H) 11/26/2017 0920   BUN 17 11/26/2017 0920   CREATININE 1.23 (H) 11/26/2017 0920   CALCIUM 9.9 11/26/2017 0920   PROT 7.5 11/26/2017 0920   ALBUMIN 4.0 11/26/2017 0920   AST 38 11/26/2017 0920   ALT 31 11/26/2017 0920   ALKPHOS 64 11/26/2017 0920   BILITOT 0.3 11/26/2017 0920   GFRNONAA 43 (L) 11/26/2017 0920   GFRAA 50 (L) 11/26/2017 0920   Lab Results  Component Value Date   CHOL 134 11/26/2017   HDL 37 (L) 11/26/2017   LDLCALC 63 11/26/2017   TRIG 168 (H) 11/26/2017   CHOLHDL 3.6 11/26/2017   No results found for: HGBA1C No results found for: VITAMINB12 No results found for: TSH   03/05/17 Neuropsych testing Clinical Impressions: Diagnosis deferred.  Results of cognitive testing were within normal limits and non indicative of a memory disorder or dementia. There is also no evidence of a primary psychiatric disorder. The patient denies symptoms indicative of major depressive episode or anxiety disorder, but she is experiencing chronic and more recent psychosocial stress (son's and grandson's alcohol abuse; recent move to [place] with lack of support system). It is most likely the case that stress is causing cognitive symptoms in daily life.   12/13/17 MRI lumbar spine  1. Advanced facet hypertrophy and grade 1 anterolisthesis at L4-5 with uncovering of a broad-based disc protrusion leads to moderate left and mild right subarticular narrowing and mild  bilateral foraminal stenosis. 2. Moderate subarticular and mild  foraminal narrowing bilaterally at L5-S1 secondary to a broad-based disc protrusion and moderate facet hypertrophy. 3. Mild subarticular and foraminal stenosis at L3-4 on the right secondary to a rightward disc protrusion.    ASSESSMENT AND PLAN  78 y.o. year old female here with:  Dx:  1. Gait difficulty   2. Vertigo   3. Lightheaded   4. Cerebellar ataxia in diseases classified elsewhere [facility])      PLAN:  GAIT DIFFICULTY (since 2013; history of left knee and left ankle surgeries; worse since 2019) - likely due to left leg weakness, left hip pain, left knee pain, left ankle pain, deconditioning and lumbar spine disease  - agree with PT evaluation; use cane or walker - consider orthopedic evaluation of low back, hip, knee, ankle pain issues - check MRI brain to rule out stroke or other causes of balance difficulty  LIGHTHEADEDNESS (intermittent faint feeling; history of syncope; previous workup by PCP and cardiology) - monitor BP at home and follow up with PCP  VERTIGO (intermittent spinning and nausea sensations) - meclizine as needed; consider vestibular PT - follow up MRI  Orders Placed This Encounter  Procedures  . MR BRAIN/IAC W WO CONTRAST   Return pending test results, for pending if symptoms worsen or fail to improve.    Penni Bombard, MD [id number], [id number] AM Certified in Neurology, Neurophysiology and Neuroimaging  [facility] [address], [place], [place] [address] [phone]

## 2019-09-05 NOTE — Patient Instructions (Signed)
GAIT DIFFICULTY (since 2013; history of left knee and left ankle surgeries; worse since 2019) - likely due to left leg weakness, left hip pain, left knee pain, left ankle pain, deconditioning and lumbar spine disease  - agree with PT evaluation; use cane or walker - consider orthopedic evaluation of low back, hip, knee, ankle pain issues - check MRI brain to rule out stroke or other causes of balance difficulty  LIGHTHEADEDNESS (intermittent faint feeling; history of syncope; previous workup by PCP and cardiology) - monitor BP at home and follow up with PCP  VERTIGO (intermittent spinning and nausea sensations) - meclizine as needed; consider vestibular PT - follow up MRI

## 2019-09-06 ENCOUNTER — Telehealth: Payer: Self-pay | Admitting: Diagnostic Neuroimaging

## 2019-09-06 NOTE — Telephone Encounter (Signed)
Medicare/tricare order sent to [facility]. No auth they will reach out to the patient to schedule.  

## 2019-09-19 ENCOUNTER — Ambulatory Visit: Payer: Medicare Other | Attending: Physician Assistant | Admitting: Physical Therapy

## 2019-09-19 ENCOUNTER — Other Ambulatory Visit: Payer: Self-pay

## 2019-09-19 ENCOUNTER — Encounter: Payer: Self-pay | Admitting: Physical Therapy

## 2019-09-19 VITALS — BP 130/74

## 2019-09-19 DIAGNOSIS — R2689 Other abnormalities of gait and mobility: Secondary | ICD-10-CM | POA: Insufficient documentation

## 2019-09-19 DIAGNOSIS — R2681 Unsteadiness on feet: Secondary | ICD-10-CM | POA: Diagnosis not present

## 2019-09-19 DIAGNOSIS — R42 Dizziness and giddiness: Secondary | ICD-10-CM | POA: Insufficient documentation

## 2019-09-19 NOTE — Patient Instructions (Signed)
Sit to Side-Lying    Sit on edge of bed. 1. Turn head 45 to right. 2. Maintain head position and lie down slowly on left side. Hold until symptoms subside. 3. Sit up slowly. Hold until symptoms subside. 4. Turn head 45 to left. 5. Maintain head position and lie down slowly on right side. Hold until symptoms subside. 6. Sit up slowly. Repeat sequence __5__ times per session. Do _2-3___ sessions per day.  Lightheadedness should not increase - if it does, just stop the exercise because that is not the result we want

## 2019-09-20 ENCOUNTER — Ambulatory Visit (INDEPENDENT_AMBULATORY_CARE_PROVIDER_SITE_OTHER): Payer: Medicare Other | Admitting: Cardiology

## 2019-09-20 ENCOUNTER — Encounter: Payer: Self-pay | Admitting: Physical Therapy

## 2019-09-20 ENCOUNTER — Encounter: Payer: Self-pay | Admitting: Cardiology

## 2019-09-20 VITALS — BP 140/76 | HR 68 | Temp 98.1°F | Ht 63.5 in | Wt 179.8 lb

## 2019-09-20 DIAGNOSIS — I2510 Atherosclerotic heart disease of native coronary artery without angina pectoris: Secondary | ICD-10-CM | POA: Diagnosis not present

## 2019-09-20 DIAGNOSIS — R0600 Dyspnea, unspecified: Secondary | ICD-10-CM

## 2019-09-20 DIAGNOSIS — J439 Emphysema, unspecified: Secondary | ICD-10-CM | POA: Diagnosis not present

## 2019-09-20 DIAGNOSIS — I10 Essential (primary) hypertension: Secondary | ICD-10-CM | POA: Diagnosis not present

## 2019-09-20 DIAGNOSIS — I712 Thoracic aortic aneurysm, without rupture: Secondary | ICD-10-CM | POA: Diagnosis not present

## 2019-09-20 DIAGNOSIS — I7121 Aneurysm of the ascending aorta, without rupture: Secondary | ICD-10-CM

## 2019-09-20 DIAGNOSIS — R269 Unspecified abnormalities of gait and mobility: Secondary | ICD-10-CM

## 2019-09-20 DIAGNOSIS — R0609 Other forms of dyspnea: Secondary | ICD-10-CM

## 2019-09-20 NOTE — Assessment & Plan Note (Signed)
Attributed to DJD- brain MRI pending

## 2019-09-20 NOTE — Assessment & Plan Note (Signed)
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

## 2019-09-20 NOTE — Assessment & Plan Note (Signed)
Check echo for LVF-

## 2019-09-20 NOTE — Assessment & Plan Note (Signed)
4.1cm on CT 10/2017.

## 2019-09-20 NOTE — Assessment & Plan Note (Signed)
"  Moderate" by CT scan Nov 2018

## 2019-09-20 NOTE — Therapy (Signed)
[facility] [address] [place], [place], [address] Phone: [phone]   Fax:  [phone]  Physical Therapy Evaluation  Patient Details  Name: [name] MRN: [id number] Date of Birth: October 27, 1941 Referring Provider (PT): Jolene Provost, [place]   Encounter Date: 09/19/2019  PT End of Session - 09/20/19 1656    Visit Number  1    Number of Visits  9    Date for PT Re-Evaluation  10/20/19    Authorization Type  Medicare/Tricare    Authorization Time Period  09-19-19 - 12-14-19    PT Start Time  1102    PT Stop Time  1146    PT Time Calculation (min)  44 min    Activity Tolerance  Patient tolerated treatment well    Behavior During Therapy  [facility] for tasks assessed/performed       Past Medical History:  Diagnosis Date  . 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  . Arthritis   . Asymptomatic coronary heart disease 11/20/2017  . Cancer [facility]) 2004   bladder   . COPD (chronic obstructive pulmonary disease) ([place])   . Crohn's disease ([place])   . Essential hypertension 11/20/2017  . Gait difficulty   . OAB (overactive bladder)   . Smoker   . Tobacco abuse 11/20/2017  . Vertigo     Past Surgical History:  Procedure Laterality Date  . BLADDER SURGERY    . CHOLECYSTECTOMY    . FOOT ARTHRODESIS Left 07/02/2017   Procedure: Left Subtalar Arthrodesis;  Surgeon: Wylene Simmer, MD;  Location: [facility];  Service: Orthopedics;  Laterality: Left;  . FRACTURE SURGERY    . HARDWARE REMOVAL Left 04/24/2016   Procedure: REMOVAL OF DEEP IMPLANT LEFT FOOT;  Surgeon: Wylene Simmer, MD;  Location: [facility];  Service: Orthopedics;  Laterality: Left;  ANESTHESIA:  REGIONAL/MONITOR ANESTHESIA  . HARDWARE REMOVAL Left 07/02/2017   Procedure: Removal of Deep Implants;  Surgeon: Wylene Simmer, MD;  Location: [facility];  Service: Orthopedics;  Laterality: Left;  . JOINT  REPLACEMENT     knee  . throat polyps    . TUBAL LIGATION      Vitals:   09/19/19 1141  BP: 130/74         [facility] PT Assessment - 09/20/19 0001      Assessment   Medical Diagnosis  Vertigo; Imbalance    Referring Provider (PT)  Jolene Provost, PA-C    Onset Date/Surgical Date  --   March 2020     Precautions   Precautions  Fall      Balance Screen   Has the patient fallen in the past 6 months  Yes    How many times?  2    Has the patient had a decrease in activity level because of a fear of falling?   No    Is the patient reluctant to leave their home because of a fear of falling?   No      Home Environment   Living Environment  Private residence    Type of [place] to enter    Entrance Stairs-Number of Steps  1    Entrance Stairs-Rails  None    Home Layout  One level   2 small steps from laundry room to garage     Prior Function   Level of Independence  Independent with basic ADLs;Independent  with homemaking with ambulation;Independent with gait;Independent with transfers      ROM / Strength   AROM / PROM / Strength  Strength      Strength   Overall Strength  Within functional limits for tasks performed    Overall Strength Comments  bil. LE's      Transfers   Transfers  Sit to Stand    Sit to Stand  5: Supervision    Number of Reps  Other reps (comment)   1   Comments  UE support needed      Ambulation/Gait   Ambulation/Gait  Yes    Ambulation/Gait Assistance  6: Modified independent (Device/Increase time)    Ambulation Distance (Feet)  100 Feet    Assistive device  None    Gait Pattern  Step-through pattern   LLE shorter than RLE - pt needs shoe lift    Ambulation Surface  Level;Indoor    Gait velocity  14.31 secs = 2.29 ft/sec      Standardized Balance Assessment   Standardized Balance Assessment  Berg Balance Test;Timed Up and Go Test      Timed Up and Go Test   Normal TUG (seconds)  14.09   no device           Vestibular Assessment - 09/20/19 0001      Symptom Behavior   Subjective history of current problem  Pt reports episode of vertigo in March - mostly resolved at this time    Type of Dizziness   Imbalance;Vertigo;Lightheadedness    Frequency of Dizziness  varies     Duration of Dizziness  seconds to minutes    Symptom Nature  Positional    Aggravating Factors  Supine to sit    Relieving Factors  Rest;Head stationary    History of similar episodes  episode in March - mostly resolved at this time      Oculomotor Exam   Oculomotor Alignment  Normal    Spontaneous  Absent    Smooth Pursuits  Intact      Positional Testing   Dix-Hallpike  Dix-Hallpike Right;Dix-Hallpike Left    Sidelying Test  Sidelying Right;Sidelying Left      Dix-Hallpike Right   Dix-Hallpike Right Duration  none    Dix-Hallpike Right Symptoms  No nystagmus      Dix-Hallpike Left   Dix-Hallpike Left Duration  none    Dix-Hallpike Left Symptoms  No nystagmus      Sidelying Right   Sidelying Right Duration  none    Sidelying Right Symptoms  No nystagmus      Sidelying Left   Sidelying Left Duration  none    Sidelying Left Symptoms  No nystagmus      Positional Sensitivities   Supine to Sitting  Lightheadedness    Up from Right Hallpike  Lightheadedness    Up from Left Hallpike  Lightheadedness          Objective measurements completed on examination: See above findings.              PT Education - 09/20/19 1655    Education Details  sit to sidelying for habituation (to Lt side first)    Person(s) Educated  Patient    Methods  Explanation;Demonstration;Handout    Comprehension  Verbalized understanding;Returned demonstration          PT Long Term Goals - 09/20/19 2013      PT LONG TERM GOAL #1   Title  Pt will improve TUG  score from 14.31 secs to </= 12.5 secs to reduce fall risk.    Baseline  14.31 secs    Time  4    Period  Weeks    Status  New    Target Date   10/20/19      PT LONG TERM GOAL #2   Title  Increase gait velocity from 2.29 ft/sec to >/= 2.6 ft/sec without use of device.    Baseline  14.31 secs = 2.29 ft/sec without device    Time  4    Period  Weeks    Status  New    Target Date  10/20/19      PT LONG TERM GOAL #3   Title  Increase endurance/activity tolerance so pt able to amb. 5" nonstop with SPC or trekking pole for assistance with balance.    Time  4    Status  New    Target Date  10/20/19      PT LONG TERM GOAL #4   Title  Pt will improve balance so that she will be able to perform SLS on each leg at least 3 secs for incr. safety with stepping over objects, step negotiation, etc.    Time  4    Period  Weeks    Status  New    Target Date  10/20/19      PT LONG TERM GOAL #5   Title  Independent in HEP for balance & vestibular exercises.    Time  4    Period  Weeks    Status  New    Target Date  10/20/19             Plan - 09/20/19 1955    Clinical Impression Statement  Pt is a 78 yr old lady with c/o vertigo (mostly resolved at this time compared to intensity at time of onset in early March 2020) and c/o balance disorder which she states is more of her concern than the dizziness at this time.  Pt's symptoms of dizziness subsided in March and remained resolved for about 3 weeks prior to re-occurring.  Pt's symptoms appear to be consistent with BPPV episode which has resolved as of current time.  No nystagmus noted with any positional testing and pt reported no room spinning vertigo with any testing.  Pt did report some light-headedness with return to upright position, probably associated with change in BP with positional change.  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  Pt states she was previously given order from MD for shoe lift but never made appt. to get it done. Pt will benefit from PT  to address gait and balance deficits and will continue to monitor/assess dizziness prn based on pt's report.    Personal Factors and Comorbidities  Comorbidity 2;Fitness;Time since onset of injury/illness/exacerbation;Past/Current Experience    Examination-Activity Limitations  Squat;Stairs;Locomotion Level;Stand;Transfers    Examination-Participation Restrictions  Cleaning;Meal Prep;Community Activity;Interpersonal Relationship;Shop       Patient will benefit from skilled therapeutic intervention in order to improve the following deficits and impairments:  Difficulty walking, Decreased balance, Decreased strength, Dizziness, Decreased activity tolerance  Visit Diagnosis: Dizziness and giddiness - Plan: PT plan of care cert/re-cert  Other abnormalities of gait and mobility - Plan: PT plan of care cert/re-cert  Unsteadiness on feet - Plan: PT plan of care cert/re-cert  Problem List Patient Active Problem List   Diagnosis Date Noted  . DOE (dyspnea on exertion) 09/20/2019  . Emphysema of lung ([place]) 09/20/2019  . Gait abnormality 09/20/2019  . 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 Essential hypertension 11/20/2017  . CAD (coronary artery disease), native coronary artery 11/20/2017  . Tobacco abuse 11/20/2017    Alda Lea, PT 09/20/2019, 8:29 PM  [facility] [address] [place], [place], [address] Phone: [phone]   Fax:  [phone]  Name: [name] MRN: [id number] Date of Birth: [birth year]

## 2019-09-20 NOTE — Assessment & Plan Note (Signed)
Controlled.  

## 2019-09-20 NOTE — Patient Instructions (Signed)
Medication Instructions:  Your physician recommends that you continue on your current medications as directed. Please refer to the Current Medication list given to you today. If you need a refill on your cardiac medications before your next appointment, please call your pharmacy.   Lab work: None  If you have labs (blood work) drawn today and your tests are completely normal, you will receive your results only by: [name] MyChart Message (if you have MyChart) OR . A paper copy in the mail If you have any lab test that is abnormal or we need to change your treatment, we will call you to review the results.  Testing/Procedures: Your physician has requested that you have an echocardiogram. Echocardiography is a painless test that uses sound waves to create images of your heart. It provides your doctor with information about the size and shape of your heart and how well your heart's chambers and valves are working. This procedure takes approximately one hour. There are no restrictions for this procedure. [address]  Follow-Up: At [facility], you and your health needs are our priority.  As part of our continuing mission to provide you with exceptional heart care, we have created designated Provider Care Teams.  These Care Teams include your primary Cardiologist (physician) and Advanced Practice Providers (APPs -  Physician Assistants and Nurse Practitioners) who all work together to provide you with the care you need, when you need it. You will need a follow up appointment in 12 months.  Please call our office 2 months in advance to schedule this appointment.  You may see Skeet Latch, MD or one of the following Advanced Practice Providers on your designated Care Team:   Kerin Ransom, PA-C Roby Lofts, [place] . Sande Rives, PA-C  Any Other Special Instructions Will Be Listed Below (If Applicable).

## 2019-09-20 NOTE — Progress Notes (Signed)
Cardiology Office Note:    Date:  09/20/2019   ID:  [name], DOB 10-31-1941, MRN [id number]  PCP:  Harlan Stains, MD  Cardiologist:  Skeet Latch, MD  Electrophysiologist:  None   Referring MD: Harlan Stains, MD   Chief Complaint  Patient presents with  . Follow-up  Yearly follow up  History of Present Illness:    [name] is a 78 y.o. female with a hx of COPD with incidental finding of coronary calcification on CT scan in November 2018.  Myoview March 2019 was low risk. She also had a mild a sending aortic dilatation of 4.1 cm.  This was supposed to be followed up July 2019, but was never done.    The patient is in the office today for a annual visit.  She has had issues with unsteady gait.  She is being followed at [facility] neurologic.  She is due to have an MRI.  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  The patient does admit to dyspnea on exertion.  Her husband says she cannot walk a block without giving out.  Dr. Blenda Mounts note from last year indicates the patient had been walking 5 days a week.  She denies any chest pain.  Past Medical History:  Diagnosis Date  . 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  . Arthritis   . Asymptomatic coronary heart disease 11/20/2017  . Cancer [facility]) 2004   bladder   . COPD (chronic obstructive pulmonary disease) ([place])   . Crohn's disease ([place])   . Essential hypertension 11/20/2017  . Gait difficulty   . OAB (overactive bladder)   . Smoker   . Tobacco abuse 11/20/2017  . Vertigo     Past Surgical History:  Procedure Laterality Date  . BLADDER SURGERY    . CHOLECYSTECTOMY    . FOOT ARTHRODESIS Left 07/02/2017   Procedure: Left Subtalar Arthrodesis;  Surgeon: Wylene Simmer, MD;  Location: [place];  Service: Orthopedics;  Laterality: Left;  . FRACTURE SURGERY    . HARDWARE REMOVAL Left 04/24/2016   Procedure: REMOVAL OF DEEP IMPLANT LEFT FOOT;   Surgeon: Wylene Simmer, MD;  Location: [place];  Service: Orthopedics;  Laterality: Left;  ANESTHESIA:  REGIONAL/MONITOR ANESTHESIA  . HARDWARE REMOVAL Left 07/02/2017   Procedure: Removal of Deep Implants;  Surgeon: Wylene Simmer, MD;  Location: [place];  Service: Orthopedics;  Laterality: Left;  . JOINT REPLACEMENT     knee  . throat polyps    . TUBAL LIGATION      Current Medications: Current Meds  Medication Sig  . aspirin EC 81 MG tablet Take 81 mg by mouth daily.  . folic acid (FOLVITE) A999333 MCG tablet Take 400 mcg by mouth daily.  [name] lidocaine (LIDODERM) 5 % Place 1 patch onto the skin daily. Remove & Discard patch within 12 hours or as directed by MD  . lisinopril (PRINIVIL,ZESTRIL) 5 MG tablet Take 5 mg by mouth daily.  . mirabegron ER (MYRBETRIQ) 50 MG TB24 tablet Take 50 mg by mouth daily.  . Multiple Vitamins-Minerals (CENTRUM ULTRA WOMENS PO) Take by mouth.  . umeclidinium-vilanterol (ANORO ELLIPTA) 62.5-25 MCG/INH AEPB Inhale 1 puff into the lungs daily.     Allergies:   Rosuvastatin   Social History   Socioeconomic History  . Marital status: Married    Spouse name: [name]  . Number of children: 2  .  Years of education: Not on file  . Highest education level: GED or equivalent  Occupational History  . Not on file  Social Needs  . Financial resource strain: Not on file  . Food insecurity    Worry: Not on file    Inability: Not on file  . Transportation needs    Medical: Not on file    Non-medical: Not on file  Tobacco Use  . Smoking status: Former Smoker    Packs/day: 1.00    Years: 40.00    Pack years: 40.00    Types: Cigarettes, E-cigarettes  . Smokeless tobacco: Never Used  . Tobacco comment: currently trying to quit 06-26-17  Substance and Sexual Activity  . Alcohol use: No    Alcohol/week: 0.0 standard drinks  . Drug use: No  . Sexual activity: Not on file  Lifestyle  . Physical activity    Days per week: Not on  file    Minutes per session: Not on file  . Stress: Not on file  Relationships  . Social Herbalist on phone: Not on file    Gets together: Not on file    Attends religious service: Not on file    Active member of club or organization: Not on file    Attends meetings of clubs or organizations: Not on file    Relationship status: Not on file  Other Topics Concern  . Not on file  Social History Narrative   Lives with husband, 2 children   Caffeine- coffee 1 cup daily     Family History: The patient's family history includes Cancer in her mother; Stroke in her sister.  ROS:   Please see the history of present illness.     All other systems reviewed and are negative.  EKGs/Labs/Other Studies Reviewed:    The following studies were reviewed today: Myoview March 2019  EKG:  EKG is ordered today.  The ekg ordered today demonstrates NSR, LAFB, HR 68  Recent Labs: No results found for requested labs within last 8760 hours.  Recent Lipid Panel    Component Value Date/Time   CHOL 134 11/26/2017 0920   TRIG 168 (H) 11/26/2017 0920   HDL 37 (L) 11/26/2017 0920   CHOLHDL 3.6 11/26/2017 0920   LDLCALC 63 11/26/2017 0920    Physical Exam:    VS:  BP 140/76   Pulse 68   Temp 98.1 F (36.7 C) (Temporal)   Ht 5' 3.5" (1.613 m)   Wt 179 lb 12.8 oz (81.6 kg)   BMI 31.35 kg/m     Wt Readings from Last 3 Encounters:  09/20/19 179 lb 12.8 oz (81.6 kg)  09/05/19 179 lb 12.8 oz (81.6 kg)  07/24/18 179 lb (81.2 kg)     GEN:  Well nourished, well developed in no acute distress HEENT: Normal NECK: No JVD; No carotid bruits LYMPHATICS: No lymphadenopathy CARDIAC: RRR, no murmurs, rubs, gallops RESPIRATORY:  Clear to auscultation without rales, wheezing or rhonchi  ABDOMEN: non-distended MUSCULOSKELETAL:  No edema; No deformity  SKIN: Warm and dry NEUROLOGIC:  Alert and oriented x 3 PSYCHIATRIC:  Normal affect   ASSESSMENT:    CAD (coronary artery disease),  native coronary artery Incidental finding of coronary Ca++ on CT scan in 2018 Myoview low risk march 2019  DOE (dyspnea on exertion) Check echo for LVF-  Emphysema of lung ([place]) "Moderate" by CT scan Nov 2018  Aneurysm of ascending aorta ([place]) 4.1cm on CT 10/2017.  Essential hypertension  Controlled  Gait abnormality Attributed to DJD- brain MRI pending  PLAN:    Check echo for DOE and ascending aortic dilatation.  B/P controlled, she cannot tolerate statin Rx.  F/U Dr Oval Linsey in a year.    Medication Adjustments/Labs and Tests Ordered: Current medicines are reviewed at length with the patient today.  Concerns regarding medicines are outlined above.  Orders Placed This Encounter  Procedures  . EKG 12-Lead  . ECHOCARDIOGRAM COMPLETE   No orders of the defined types were placed in this encounter.   Patient Instructions  Medication Instructions:  Your physician recommends that you continue on your current medications as directed. Please refer to the Current Medication list given to you today. If you need a refill on your cardiac medications before your next appointment, please call your pharmacy.   Lab work: None  If you have labs (blood work) drawn today and your tests are completely normal, you will receive your results only by: [name] MyChart Message (if you have MyChart) OR . A paper copy in the mail If you have any lab test that is abnormal or we need to change your treatment, we will call you to review the results.  Testing/Procedures: Your physician has requested that you have an echocardiogram. Echocardiography is a painless test that uses sound waves to create images of your heart. It provides your doctor with information about the size and shape of your heart and how well your heart's chambers and valves are working. This procedure takes approximately one hour. There are no restrictions for this procedure. [address]  Follow-Up: At [facility], you and your health needs are our priority.  As part of our continuing mission to provide you with exceptional heart care, we have created designated Provider Care Teams.  These Care Teams include your primary Cardiologist (physician) and Advanced Practice Providers (APPs -  Physician Assistants and Nurse Practitioners) who all work together to provide you with the care you need, when you need it. You will need a follow up appointment in 12 months.  Please call our office 2 months in advance to schedule this appointment.  You may see Skeet Latch, MD or one of the following Advanced Practice Providers on your designated Care Team:   Kerin Ransom, PA-C Roby Lofts, [place] . Sande Rives, PA-C  Any Other Special Instructions Will Be Listed Below (If Applicable).      Signed, Kerin Ransom, PA-C  09/20/2019 9:48 AM    [facility]

## 2019-09-22 ENCOUNTER — Ambulatory Visit
Admission: RE | Admit: 2019-09-22 | Discharge: 2019-09-22 | Disposition: A | Discharge status: Home / Self Care | Payer: Medicare Other | Source: Ambulatory Visit | Attending: Family Medicine | Admitting: Family Medicine

## 2019-09-22 ENCOUNTER — Other Ambulatory Visit: Payer: Self-pay

## 2019-09-22 DIAGNOSIS — Z1231 Encounter for screening mammogram for malignant neoplasm of breast: Secondary | ICD-10-CM

## 2019-09-22 DIAGNOSIS — R7401 Elevation of levels of liver transaminase levels: Secondary | ICD-10-CM | POA: Diagnosis not present

## 2019-09-22 DIAGNOSIS — R945 Abnormal results of liver function studies: Secondary | ICD-10-CM | POA: Diagnosis not present

## 2019-09-23 DIAGNOSIS — H401131 Primary open-angle glaucoma, bilateral, mild stage: Secondary | ICD-10-CM | POA: Diagnosis not present

## 2019-09-23 DIAGNOSIS — H04123 Dry eye syndrome of bilateral lacrimal glands: Secondary | ICD-10-CM | POA: Diagnosis not present

## 2019-09-23 DIAGNOSIS — I10 Essential (primary) hypertension: Secondary | ICD-10-CM | POA: Diagnosis not present

## 2019-09-26 ENCOUNTER — Ambulatory Visit: Payer: Medicare Other | Admitting: Physical Therapy

## 2019-09-26 ENCOUNTER — Other Ambulatory Visit: Payer: Self-pay

## 2019-09-26 DIAGNOSIS — R42 Dizziness and giddiness: Secondary | ICD-10-CM | POA: Diagnosis not present

## 2019-09-26 DIAGNOSIS — R2681 Unsteadiness on feet: Secondary | ICD-10-CM | POA: Diagnosis not present

## 2019-09-26 DIAGNOSIS — R2689 Other abnormalities of gait and mobility: Secondary | ICD-10-CM

## 2019-09-26 NOTE — Patient Instructions (Signed)
Sit to Side-Lying    Sit on edge of bed. 1. Turn head 45 to right. 2. Maintain head position and lie down slowly on left side. Hold until symptoms subside. 3. Sit up slowly. Hold until symptoms subside. 4. Turn head 45 to left. 5. Maintain head position and lie down slowly on right side. Hold until symptoms subside. 6. Sit up slowly. Repeat sequence __5__ times per session. Do _2-3___ sessions per day.  Lightheadedness should not increase - if it does, just stop the exercise because that is not the result we want    WITH ALL THE STANDING EXERCISES HOLD ON TO THE COUNTER FOR SUPPORT  Single Leg - Eyes Open    Holding support, lift right leg while maintaining balance over other leg. Progress to removing hands from support surface for longer periods of time. Hold 10seconds. Repeat _3__ times per session. Do __1__ sessions per day.  Copyright  VHI. All rights reserved.  Tandem Stance    Right foot in front of left, heel touching toe both feet "straight ahead". Stand on Foot Triangle of Support with both feet. Balance in this position 10___ seconds. Do with left foot in front of right.   Repeat 3 times each direction.  Copyright  VHI. All rights reserved.   Backward Walking    Holding onto counter for support. Walk backward, toes of each foot coming down first. Take long, even strides. Make sure you have a clear pathway with no obstructions when you do this.  Repeat 4 times along counter.   Copyright  VHI. All rights reserved.  Side-Stepping    Walk to left side with eyes open. Take even steps, leading with same foot. Make sure each foot lifts off the floor. Repeat in opposite direction. Repeat for 3 times each direction along counter.  Copyright  VHI. All rights reserved.  Hamstring Curl: Standing (Single Leg)    Bend knee, foot toward buttock.  Repeat on other side and alternate sides.  Do 10 times each side once a day.   http://tub.exer.us/198   Copyright   VHI. All rights reserved.    Copyright  VHI. All rights reserved.  ABDUCTION: Standing (Active)    Stand, feet flat. Lift right leg out to side. Repeat on other side and alternate for 10 reps each side.  Do once a day.   http://gtsc.exer.us/111   Copyright  VHI. All rights reserved.  "I love a Parade" Lift    Using a chair if necessary, march in place 10 times alternating legs.  Do once a day.   http://gt2.exer.us/345   Copyright  VHI. All rights reserved.

## 2019-09-26 NOTE — Therapy (Signed)
[facility] [address] [name], [place], [address] Phone: [phone]   Fax:  [phone]  Physical Therapy Treatment  Patient Details  Name: [name] MRN: [id number] Date of Birth: 03/20/1941 Referring Provider (PT): Jolene Provost, [place]   Encounter Date: 09/26/2019  PT End of Session - 09/26/19 1231    Visit Number  2    Number of Visits  9    Date for PT Re-Evaluation  10/20/19    Authorization Type  Medicare/Tricare    Authorization Time Period  09-19-19 - 12-14-19    PT Start Time  0935    PT Stop Time  1015    PT Time Calculation (min)  40 min    Activity Tolerance  Patient tolerated treatment well    Behavior During Therapy  [facility] for tasks assessed/performed       Past Medical History:  Diagnosis Date  . 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  . Arthritis   . Asymptomatic coronary heart disease 11/20/2017  . Cancer [facility]) 2004   bladder   . COPD (chronic obstructive pulmonary disease) ([place])   . Crohn's disease ([place])   . Essential hypertension 11/20/2017  . Gait difficulty   . OAB (overactive bladder)   . Smoker   . Tobacco abuse 11/20/2017  . Vertigo     Past Surgical History:  Procedure Laterality Date  . BLADDER SURGERY    . CHOLECYSTECTOMY    . FOOT ARTHRODESIS Left 07/02/2017   Procedure: Left Subtalar Arthrodesis;  Surgeon: Wylene Simmer, MD;  Location: [place];  Service: Orthopedics;  Laterality: Left;  . FRACTURE SURGERY    . HARDWARE REMOVAL Left 04/24/2016   Procedure: REMOVAL OF DEEP IMPLANT LEFT FOOT;  Surgeon: Wylene Simmer, MD;  Location: [place];  Service: Orthopedics;  Laterality: Left;  ANESTHESIA:  REGIONAL/MONITOR ANESTHESIA  . HARDWARE REMOVAL Left 07/02/2017   Procedure: Removal of Deep Implants;  Surgeon: Wylene Simmer, MD;  Location: [place];  Service: Orthopedics;  Laterality: Left;  . JOINT  REPLACEMENT     knee  . throat polyps    . TUBAL LIGATION      There were no vitals filed for this visit.  Subjective Assessment - 09/26/19 0937    Subjective  Pt denies any falls or changes since last viist.  Did see cardiologist and has MRI ordered for tomorrow.  Lost her exercise sheet so was not able to practice the HEP provided last viist.    Patient is accompained by:  Family member    Pertinent History  COPD, HTN, Lt foot arthrodesis July 2018:  c/o low back pain due to radiculopathy lumbar region; OA hips & knees    Patient Stated Goals  resolve the vertigo, improve balance    Currently in Pain?  No/denies           [facility] Adult PT Treatment/Exercise - 09/26/19 0001      Transfers   Transfers  Sit to Stand    Sit to Stand  5: Supervision    Number of Reps  10 reps    Comments  UE support needed      High Level Balance   High Level Balance Activities  Other (comment)      Exercises   Exercises  Knee/Hip      Knee/Hip Exercises: Aerobic   Other Aerobic  Scifit level 1.5 all 4 extremities x 7 minutes  for strength and endurance      Knee/Hip Exercises: Standing   Heel Raises  Both;10 reps    Knee Flexion  Both;1 set;10 reps   hamstring curl   Hip Flexion  Both;10 reps;Knee bent    Hip Abduction  Both;1 set;10 reps;Knee straight    SLS  3 reps x 10 sec each side    Other Standing Knee Exercises  tandem stance 10 sec x 3 reps each direction, backwards walking along counter 8' x 4 reps, side stepping along counter 8' x 4 reps      Reviewed sit to sidelying for habituation provided last session.  Pt denies any dizziness except with return to sitting after sidelying.       PT Education - 09/26/19 1230    Education Details  additions to HEP    Person(s) Educated  Patient    Methods  Explanation;Demonstration;Handout    Comprehension  Verbalized understanding;Returned demonstration          PT Long Term Goals - 09/20/19 2013      PT LONG TERM GOAL #1    Title  Pt will improve TUG score from 14.31 secs to </= 12.5 secs to reduce fall risk.    Baseline  14.31 secs    Time  4    Period  Weeks    Status  New    Target Date  10/20/19      PT LONG TERM GOAL #2   Title  Increase gait velocity from 2.29 ft/sec to >/= 2.6 ft/sec without use of device.    Baseline  14.31 secs = 2.29 ft/sec without device    Time  4    Period  Weeks    Status  New    Target Date  10/20/19      PT LONG TERM GOAL #3   Title  Increase endurance/activity tolerance so pt able to amb. 5" nonstop with SPC or trekking pole for assistance with balance.    Time  4    Status  New    Target Date  10/20/19      PT LONG TERM GOAL #4   Title  Pt will improve balance so that she will be able to perform SLS on each leg at least 3 secs for incr. safety with stepping over objects, step negotiation, etc.    Time  4    Period  Weeks    Status  New    Target Date  10/20/19      PT LONG TERM GOAL #5   Title  Independent in HEP for balance & vestibular exercises.    Time  4    Period  Weeks    Status  New    Target Date  10/20/19            Plan - 09/26/19 1231    Clinical Impression Statement  Skilled session focused on developing HEP for balance and strength.  Pt did exhibit some dyspnea with activities but did not need rest breaks.  Continue PT per POC.    Personal Factors and Comorbidities  Comorbidity 2;Fitness;Time since onset of injury/illness/exacerbation;Past/Current Experience    Examination-Activity Limitations  Squat;Stairs;Locomotion Level;Stand;Transfers    Examination-Participation Restrictions  Cleaning;Meal Prep;Community Activity;Interpersonal Relationship;Shop    PT Next Visit Plan  Check MRI results if available.  Review HEP.  Continue balance and strength.    Consulted and Agree with Plan of Care  Patient       Patient will benefit  from skilled therapeutic intervention in order to improve the following deficits and impairments:  Difficulty  walking, Decreased balance, Decreased strength, Dizziness, Decreased activity tolerance  Visit Diagnosis: Unsteadiness on feet  Other abnormalities of gait and mobility  Dizziness and giddiness     Problem List Patient Active Problem List   Diagnosis Date Noted  . DOE (dyspnea on exertion) 09/20/2019  . Emphysema of lung ([place]) 09/20/2019  . Gait abnormality 09/20/2019  . 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 Essential hypertension 11/20/2017  . CAD (coronary artery disease), native coronary artery 11/20/2017  . Tobacco abuse 11/20/2017   Narda Bonds, PTA [place] 09/26/19 12:38 PM Phone: [phone] Fax: [facility] [place] [address] [place], [place], [address] Phone: [phone]   Fax:  [phone]  Name: Shawn Eisenstadt MRN: [id number] Date of Birth: [birth year]

## 2019-09-27 ENCOUNTER — Ambulatory Visit
Admission: RE | Admit: 2019-09-27 | Discharge: 2019-09-27 | Disposition: A | Discharge status: Home / Self Care | Payer: Medicare Other | Source: Ambulatory Visit | Attending: Diagnostic Neuroimaging | Admitting: Diagnostic Neuroimaging

## 2019-09-27 DIAGNOSIS — G3281 Cerebellar ataxia in diseases classified elsewhere: Secondary | ICD-10-CM | POA: Diagnosis not present

## 2019-09-27 MED ORDER — GADOBENATE DIMEGLUMINE 529 MG/ML IV SOLN
15.0000 mL | Freq: Once | INTRAVENOUS | Status: AC | PRN
  Administered 2019-09-27: 15 mL via INTRAVENOUS

## 2019-09-29 ENCOUNTER — Ambulatory Visit (HOSPITAL_COMMUNITY): Payer: Medicare Other | Attending: Cardiovascular Disease

## 2019-09-29 ENCOUNTER — Other Ambulatory Visit: Payer: Self-pay

## 2019-09-29 DIAGNOSIS — R0600 Dyspnea, unspecified: Secondary | ICD-10-CM | POA: Diagnosis not present

## 2019-09-29 DIAGNOSIS — I712 Thoracic aortic aneurysm, without rupture: Secondary | ICD-10-CM | POA: Insufficient documentation

## 2019-10-06 DIAGNOSIS — M19072 Primary osteoarthritis, left ankle and foot: Secondary | ICD-10-CM | POA: Diagnosis not present

## 2019-10-06 DIAGNOSIS — M19071 Primary osteoarthritis, right ankle and foot: Secondary | ICD-10-CM | POA: Diagnosis not present

## 2019-10-06 DIAGNOSIS — M722 Plantar fascial fibromatosis: Secondary | ICD-10-CM | POA: Diagnosis not present

## 2019-10-10 ENCOUNTER — Telehealth: Payer: Self-pay | Admitting: *Deleted

## 2019-10-10 NOTE — Telephone Encounter (Signed)
Called husband, Herbie [place] on [place] and informed him her MRI brain/IAC results were unremarkable. He stated she's getting therapy for gait, vertigo but appointments are spaced apart. He stated she may also be fitted for a special shoe, and he is researching that. He will let patient know and verbalized understanding, appreciation.

## 2019-10-13 ENCOUNTER — Ambulatory Visit: Payer: Medicare Other | Admitting: Physical Therapy

## 2019-10-17 ENCOUNTER — Encounter: Payer: Self-pay | Admitting: Physical Therapy

## 2019-10-17 ENCOUNTER — Ambulatory Visit: Payer: Medicare Other | Attending: Physician Assistant | Admitting: Physical Therapy

## 2019-10-17 ENCOUNTER — Other Ambulatory Visit: Payer: Self-pay

## 2019-10-17 DIAGNOSIS — R2681 Unsteadiness on feet: Secondary | ICD-10-CM | POA: Diagnosis not present

## 2019-10-17 DIAGNOSIS — R42 Dizziness and giddiness: Secondary | ICD-10-CM | POA: Diagnosis not present

## 2019-10-17 DIAGNOSIS — R2689 Other abnormalities of gait and mobility: Secondary | ICD-10-CM | POA: Diagnosis not present

## 2019-10-17 NOTE — Patient Instructions (Signed)
Access Code: [email or web address]  URL: [email or web address]  Date: 10/17/2019  Prepared by: Nita Sells   Exercises  Clamshell with Resistance - 10 reps - 1x daily - 7x weekly  Supine Bridge - 10 reps - 1x daily - 7x weekly  Supine Hip Adduction Isometric with Ball - 10 reps - 1x daily - 7x weekly  Hooklying Isometric Clamshell - 10 reps - 1x daily - 7x weekly

## 2019-10-17 NOTE — Therapy (Signed)
[facility] [address] [place], [place], [address] Phone: [phone]   Fax:  [phone]  Physical Therapy Treatment  Patient Details  Name: [name] MRN: [id number] Date of Birth: 02-22-1941 Referring Provider (PT): Jolene Provost, [place]   Encounter Date: 10/17/2019  PT End of Session - 10/17/19 1000    Visit Number  3    Number of Visits  9    Date for PT Re-Evaluation  10/20/19    Authorization Type  Medicare/Tricare    Authorization Time Period  09-19-19 - 12-14-19    PT Start Time  0852    PT Stop Time  0932    PT Time Calculation (min)  40 min    Activity Tolerance  Patient tolerated treatment well    Behavior During Therapy  [facility] for tasks assessed/performed       Past Medical History:  Diagnosis Date  . 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  . Arthritis   . Asymptomatic coronary heart disease 11/20/2017  . Cancer [facility]) 2004   bladder   . COPD (chronic obstructive pulmonary disease) ([place])   . Crohn's disease ([place])   . Essential hypertension 11/20/2017  . Gait difficulty   . OAB (overactive bladder)   . Smoker   . Tobacco abuse 11/20/2017  . Vertigo     Past Surgical History:  Procedure Laterality Date  . BLADDER SURGERY    . CHOLECYSTECTOMY    . FOOT ARTHRODESIS Left 07/02/2017   Procedure: Left Subtalar Arthrodesis;  Surgeon: Wylene Simmer, MD;  Location: [facility];  Service: Orthopedics;  Laterality: Left;  . FRACTURE SURGERY    . HARDWARE REMOVAL Left 04/24/2016   Procedure: REMOVAL OF DEEP IMPLANT LEFT FOOT;  Surgeon: Wylene Simmer, MD;  Location: [facility];  Service: Orthopedics;  Laterality: Left;  ANESTHESIA:  REGIONAL/MONITOR ANESTHESIA  . HARDWARE REMOVAL Left 07/02/2017   Procedure: Removal of Deep Implants;  Surgeon: Wylene Simmer, MD;  Location: [facility];  Service: Orthopedics;  Laterality: Left;  . JOINT  REPLACEMENT     knee  . throat polyps    . TUBAL LIGATION      There were no vitals filed for this visit.  Subjective Assessment - 10/17/19 0854    Subjective  Denies any falls or changes.  Did have MRI and "everything was ok".    Pertinent History  COPD, HTN, Lt foot arthrodesis July 2018:  c/o low back pain due to radiculopathy lumbar region; OA hips & knees    Patient Stated Goals  resolve the vertigo, improve balance    Currently in Pain?  No/denies          [facility] Adult PT Treatment/Exercise - 10/17/19 0001      Transfers   Transfers  Sit to Stand;Stand to Sit;Supine to Sit;Sit to Supine    Sit to Stand  6: Modified independent (Device/Increase time)    Stand to Sit  6: Modified independent (Device/Increase time)    Supine to Sit  7: Independent    Sit to Supine  7: Independent    Comments  uses UE's;unsteady at times      Ambulation/Gait   Ambulation/Gait  Yes    Ambulation/Gait Assistance  6: Modified independent (Device/Increase time)    Ambulation Distance (Feet)  --   through out gym during activities   Assistive device  None    Gait Pattern  Step-to pattern;Trendelenburg;Lateral trunk  lean to left    Ambulation Surface  Level;Indoor      High Level Balance   High Level Balance Activities  Other (comment)    High Level Balance Comments  Standing at steps with 1 UE support for taps to 6" step x 20 each side then taps to 6", 12", 6", floor x 15 each side.  Pt able to perform several reps of each without UE support.  SLS x 10 sec x 3 reps each side and tandem stance x 10 sec x 3 reps each direction intermittent UE support.      Exercises   Exercises  Knee/Hip      Knee/Hip Exercises: Standing   Knee Flexion  Strengthening;Both;1 set;10 reps;Other (comment)   4# weight   Hip Flexion  Stengthening;Both;15 reps;Knee bent;Knee straight;2 sets   4# weight;1 set with knee bent and 1 set with knee straight   Hip Abduction  Both;Stengthening;1 set;15 reps;Knee straight    4# weight;cues to maintain abduction of hip vs fllexion   Hip Extension  Stengthening;Both;1 set;15 reps;Knee straight   4# weight     Knee/Hip Exercises: Supine   Hip Adduction Isometric  Both;1 set;15 reps    Bridges  Both;Strengthening;1 set;15 reps    Other Supine Knee/Hip Exercises  hooklying clams with red theraband x 15 reps      Knee/Hip Exercises: Sidelying   Clams  15 reps with red theraband on each side             PT Education - 10/17/19 0959    Education Details  Additions to HEP    Person(s) Educated  Patient    Methods  Explanation;Demonstration;Handout    Comprehension  Verbalized understanding          PT Long Term Goals - 09/20/19 2013      PT LONG TERM GOAL #1   Title  Pt will improve TUG score from 14.31 secs to </= 12.5 secs to reduce fall risk.    Baseline  14.31 secs    Time  4    Period  Weeks    Status  New    Target Date  10/20/19      PT LONG TERM GOAL #2   Title  Increase gait velocity from 2.29 ft/sec to >/= 2.6 ft/sec without use of device.    Baseline  14.31 secs = 2.29 ft/sec without device    Time  4    Period  Weeks    Status  New    Target Date  10/20/19      PT LONG TERM GOAL #3   Title  Increase endurance/activity tolerance so pt able to amb. 5" nonstop with SPC or trekking pole for assistance with balance.    Time  4    Status  New    Target Date  10/20/19      PT LONG TERM GOAL #4   Title  Pt will improve balance so that she will be able to perform SLS on each leg at least 3 secs for incr. safety with stepping over objects, step negotiation, etc.    Time  4    Period  Weeks    Status  New    Target Date  10/20/19      PT LONG TERM GOAL #5   Title  Independent in HEP for balance & vestibular exercises.    Time  4    Period  Weeks    Status  New  Target Date  10/20/19            Plan - 10/17/19 1000    Clinical Impression Statement  Skilled session focused on balance and strengthening exercises.   Added hip strengthening to HEP.  Pt states she is making an appointment with [employer] for custom shoes and has MD order to do so.  Continue PT per POC.    Personal Factors and Comorbidities  Comorbidity 2;Fitness;Time since onset of injury/illness/exacerbation;Past/Current Experience    Examination-Activity Limitations  Squat;Stairs;Locomotion Level;Stand;Transfers    Examination-Participation Restrictions  Cleaning;Meal Prep;Community Activity;Interpersonal Relationship;Shop    PT Next Visit Plan  Review HEP.  Continue balance and strength.    PT Home Exercise Plan  Access Code: [email or web address] URL: [email or web address]    Consulted and Agree with Plan of Care  Patient       Patient will benefit from skilled therapeutic intervention in order to improve the following deficits and impairments:  Difficulty walking, Decreased balance, Decreased strength, Dizziness, Decreased activity tolerance  Visit Diagnosis: Unsteadiness on feet  Other abnormalities of gait and mobility  Dizziness and giddiness     Problem List Patient Active Problem List   Diagnosis Date Noted  . DOE (dyspnea on exertion) 09/20/2019  . Emphysema of lung ([place]) 09/20/2019  . Gait abnormality 09/20/2019  . 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 Essential hypertension 11/20/2017  . CAD (coronary artery disease), native coronary artery 11/20/2017  . Tobacco abuse 11/20/2017    Narda Bonds, PTA [facility] 10/17/19 10:07 AM Phone: [phone] Fax: [facility] [address] [place], [place], [address] Phone: [phone]   Fax:  [phone]  Name: [name] MRN: [id number] Date of Birth: 11-Dec-1941

## 2019-10-17 NOTE — Addendum Note (Signed)
Addended by: Lamar Benes on: 10/17/2019 12:50 PM   Modules accepted: Orders

## 2019-10-20 ENCOUNTER — Other Ambulatory Visit: Payer: Self-pay

## 2019-10-21 ENCOUNTER — Encounter

## 2019-10-24 DIAGNOSIS — R945 Abnormal results of liver function studies: Secondary | ICD-10-CM | POA: Diagnosis not present

## 2019-10-24 DIAGNOSIS — R7401 Elevation of levels of liver transaminase levels: Secondary | ICD-10-CM | POA: Diagnosis not present

## 2019-10-25 DIAGNOSIS — I129 Hypertensive chronic kidney disease with stage 1 through stage 4 chronic kidney disease, or unspecified chronic kidney disease: Secondary | ICD-10-CM | POA: Diagnosis not present

## 2019-10-25 DIAGNOSIS — F17201 Nicotine dependence, unspecified, in remission: Secondary | ICD-10-CM | POA: Diagnosis not present

## 2019-10-25 DIAGNOSIS — F439 Reaction to severe stress, unspecified: Secondary | ICD-10-CM | POA: Diagnosis not present

## 2019-10-25 DIAGNOSIS — R5383 Other fatigue: Secondary | ICD-10-CM | POA: Diagnosis not present

## 2019-10-25 DIAGNOSIS — M810 Age-related osteoporosis without current pathological fracture: Secondary | ICD-10-CM | POA: Diagnosis not present

## 2019-10-25 DIAGNOSIS — J449 Chronic obstructive pulmonary disease, unspecified: Secondary | ICD-10-CM | POA: Diagnosis not present

## 2019-10-25 DIAGNOSIS — N183 Chronic kidney disease, stage 3 unspecified: Secondary | ICD-10-CM | POA: Diagnosis not present

## 2019-10-25 DIAGNOSIS — Z0000 Encounter for general adult medical examination without abnormal findings: Secondary | ICD-10-CM | POA: Diagnosis not present

## 2019-10-25 DIAGNOSIS — Z23 Encounter for immunization: Secondary | ICD-10-CM | POA: Diagnosis not present

## 2019-10-26 ENCOUNTER — Ambulatory Visit: Payer: Medicare Other | Admitting: Physical Therapy

## 2019-10-26 ENCOUNTER — Other Ambulatory Visit: Payer: Self-pay

## 2019-10-26 DIAGNOSIS — R2681 Unsteadiness on feet: Secondary | ICD-10-CM

## 2019-10-26 DIAGNOSIS — R2689 Other abnormalities of gait and mobility: Secondary | ICD-10-CM

## 2019-10-26 DIAGNOSIS — R42 Dizziness and giddiness: Secondary | ICD-10-CM | POA: Diagnosis not present

## 2019-10-26 NOTE — Therapy (Signed)
[facility] [address] [place], [place], [address] Phone: [phone]   Fax:  [phone]  Physical Therapy Treatment  Patient Details  Name: [name] MRN: [id number] Date of Birth: 10-01-1941 Referring Provider (PT): Jolene Provost, [place]   Encounter Date: 10/26/2019  PT End of Session - 10/26/19 1030    Visit Number  4    Number of Visits  9    Date for PT Re-Evaluation  10/20/19    Authorization Type  Medicare/Tricare    Authorization Time Period  09-19-19 - 12-14-19    PT Start Time  0935    PT Stop Time  1015    PT Time Calculation (min)  40 min    Activity Tolerance  Patient tolerated treatment well    Behavior During Therapy  [facility] for tasks assessed/performed       Past Medical History:  Diagnosis Date  . 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  . Arthritis   . Asymptomatic coronary heart disease 11/20/2017  . Cancer [facility]) 2004   bladder   . COPD (chronic obstructive pulmonary disease) ([place])   . Crohn's disease ([place])   . Essential hypertension 11/20/2017  . Gait difficulty   . OAB (overactive bladder)   . Smoker   . Tobacco abuse 11/20/2017  . Vertigo     Past Surgical History:  Procedure Laterality Date  . BLADDER SURGERY    . CHOLECYSTECTOMY    . FOOT ARTHRODESIS Left 07/02/2017   Procedure: Left Subtalar Arthrodesis;  Surgeon: Wylene Simmer, MD;  Location: [facility];  Service: Orthopedics;  Laterality: Left;  . FRACTURE SURGERY    . HARDWARE REMOVAL Left 04/24/2016   Procedure: REMOVAL OF DEEP IMPLANT LEFT FOOT;  Surgeon: Wylene Simmer, MD;  Location: [facility];  Service: Orthopedics;  Laterality: Left;  ANESTHESIA:  REGIONAL/MONITOR ANESTHESIA  . HARDWARE REMOVAL Left 07/02/2017   Procedure: Removal of Deep Implants;  Surgeon: Wylene Simmer, MD;  Location: [facility] [place];  Service: Orthopedics;  Laterality: Left;  . JOINT  REPLACEMENT     knee  . throat polyps    . TUBAL LIGATION      There were no vitals filed for this visit.  Subjective Assessment - 10/26/19 0943    Subjective  denies dizziness or pain upon arrival. Still has some dizziness sometimes getting in and out of bed    Patient Stated Goals  resolve the vertigo, improve balance    Currently in Pain?  No/denies                       [facility] Adult PT Treatment/Exercise - 10/26/19 0001      Ambulation/Gait   Ambulation/Gait  Yes    Ambulation/Gait Assistance  6: Modified independent (Device/Increase time)    Ambulation Distance (Feet)  230 Feet    Assistive device  None    Gait Pattern  Step-to pattern;Trendelenburg;Lateral trunk lean to left      High Level Balance   High Level Balance Comments  sidestepping no UE support with green band around knees in bars up/down X 3, then retro walk no UE support up/down X3, then tandem balance 30 sec X 2 bilat, then SLS 10-20 sec X 3 each side      Neuro Re-ed    Neuro Re-ed Details   Brandt-Daroff Exercise for vetigo, one time each side, became dizzy with both sides  Knee/Hip Exercises: Standing   Lateral Step Up  Both;10 reps;Hand Hold: 1;Step Height: 6"      Knee/Hip Exercises: Supine   Hip Adduction Isometric  Both;1 set;15 reps    Bridges  Both;Strengthening;1 set;15 reps    Other Supine Knee/Hip Exercises  hooklying clams with green theraband x 15 reps, then isometric clams with green X 15 on easch side      Knee/Hip Exercises: Sidelying   Clams  15 reps bilat             PT Education - 10/26/19 1030    Education Details  BPPV treatment and rational    Person(s) Educated  Patient    Methods  Explanation    Comprehension  Verbalized understanding          PT Long Term Goals - 10/17/19 1232      PT LONG TERM GOAL #1   Title  Pt will improve TUG score from 14.31 secs to </= 12.5 secs to reduce fall risk.    Baseline  14.31 secs    Time  4    Period   Weeks    Status  New    Target Date  11/16/19   Extend target date for LTG's due to inconsistent attendance     PT LONG TERM GOAL #2   Title  Increase gait velocity from 2.29 ft/sec to >/= 2.6 ft/sec without use of device.    Baseline  14.31 secs = 2.29 ft/sec without device    Time  4    Period  Weeks    Status  New    Target Date  11/16/19      PT LONG TERM GOAL #3   Title  Increase endurance/activity tolerance so pt able to amb. 5" nonstop with SPC or trekking pole for assistance with balance.    Time  4    Status  New    Target Date  11/16/19      PT LONG TERM GOAL #4   Title  Pt will improve balance so that she will be able to perform SLS on each leg at least 3 secs for incr. safety with stepping over objects, step negotiation, etc.    Time  4    Period  Weeks    Status  New    Target Date  11/16/19      PT LONG TERM GOAL #5   Title  Independent in HEP for balance & vestibular exercises.    Time  4    Period  Weeks    Status  New    Target Date  11/16/19            Plan - 10/26/19 1030    Clinical Impression Statement  Session focused on trial of BPPV treatment as she became dizzy with rolling on mat table performing sidelying exercises and supine to sit. She became dizzy and nausea with treatment but PT explained this is normal for this treatment. Session then focused on leg strength and overall balance training. PT will assess her response to BPPV treatment when she returns tommorow.    Personal Factors and Comorbidities  Comorbidity 2;Fitness;Time since onset of injury/illness/exacerbation;Past/Current Experience    Examination-Activity Limitations  Squat;Stairs;Locomotion Level;Stand;Transfers    Examination-Participation Restrictions  Cleaning;Meal Prep;Community Activity;Interpersonal Relationship;Shop    Rehab Potential  Good    PT Frequency  2x / week    PT Duration  4 weeks    PT Treatment/Interventions  ADLs/Self Care Home Management;Neuromuscular  re-education;Patient/family education;Therapeutic exercise;Gait training;Stair training;Therapeutic activities;Vestibular;Balance training;Orthotic Fit/Training    PT Next Visit Plan  if she continues to have dizziness with bed mobility consider epply or retry Brandt-Daroff Exercise. Continue balance and strength.    PT Home Exercise Plan  Access Code: [email or web address] URL: [email or web address]    Consulted and Agree with Plan of Care  Patient       Patient will benefit from skilled therapeutic intervention in order to improve the following deficits and impairments:  Difficulty walking, Decreased balance, Decreased strength, Dizziness, Decreased activity tolerance  Visit Diagnosis: Unsteadiness on feet  Other abnormalities of gait and mobility  Dizziness and giddiness     Problem List Patient Active Problem List   Diagnosis Date Noted  . DOE (dyspnea on exertion) 09/20/2019  . Emphysema of lung ([place]) 09/20/2019  . Gait abnormality 09/20/2019  . 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 Essential hypertension 11/20/2017  . CAD (coronary artery disease), native coronary artery 11/20/2017  . Tobacco abuse 11/20/2017    Silvestre Mesi 10/26/2019, 10:38 AM  [facility] [address] [place], [place], [address] Phone: [phone]   Fax:  [phone]  Name: [name] MRN: [id number] Date of Birth: Oct 04, 1941

## 2019-10-27 ENCOUNTER — Other Ambulatory Visit: Payer: Self-pay

## 2019-10-27 ENCOUNTER — Encounter: Payer: Self-pay | Admitting: Physical Therapy

## 2019-10-27 ENCOUNTER — Ambulatory Visit: Payer: Medicare Other | Admitting: Physical Therapy

## 2019-10-27 ENCOUNTER — Other Ambulatory Visit: Payer: Self-pay | Admitting: Family Medicine

## 2019-10-27 DIAGNOSIS — R42 Dizziness and giddiness: Secondary | ICD-10-CM | POA: Diagnosis not present

## 2019-10-27 DIAGNOSIS — R2689 Other abnormalities of gait and mobility: Secondary | ICD-10-CM

## 2019-10-27 DIAGNOSIS — R2681 Unsteadiness on feet: Secondary | ICD-10-CM

## 2019-10-27 DIAGNOSIS — M810 Age-related osteoporosis without current pathological fracture: Secondary | ICD-10-CM

## 2019-10-27 NOTE — Therapy (Signed)
[facility] [address] [place], [place], [address] Phone: [phone]   Fax:  [phone]  Physical Therapy Treatment  Patient Details  Name: [name] MRN: [id number] Date of Birth: [birth year] Referring Provider (PT): Jolene Provost, [place]   Encounter Date: 10/27/2019  PT End of Session - 10/27/19 1132    Visit Number  5    Number of Visits  9    Date for PT Re-Evaluation  10/20/19    Authorization Type  Medicare/Tricare    Authorization Time Period  09-19-19 - 12-14-19    PT Start Time  0935    PT Stop Time  1015    PT Time Calculation (min)  40 min    Activity Tolerance  Patient tolerated treatment well    Behavior During Therapy  [facility] for tasks assessed/performed       Past Medical History:  Diagnosis Date  . 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  . Arthritis   . Asymptomatic coronary heart disease 11/20/2017  . Cancer [facility]) 2004   bladder   . COPD (chronic obstructive pulmonary disease) ([place])   . Crohn's disease ([place])   . Essential hypertension 11/20/2017  . Gait difficulty   . OAB (overactive bladder)   . Smoker   . Tobacco abuse 11/20/2017  . Vertigo     Past Surgical History:  Procedure Laterality Date  . BLADDER SURGERY    . CHOLECYSTECTOMY    . FOOT ARTHRODESIS Left 07/02/2017   Procedure: Left Subtalar Arthrodesis;  Surgeon: Wylene Simmer, MD;  Location: [facility];  Service: Orthopedics;  Laterality: Left;  . FRACTURE SURGERY    . HARDWARE REMOVAL Left 04/24/2016   Procedure: REMOVAL OF DEEP IMPLANT LEFT FOOT;  Surgeon: Wylene Simmer, MD;  Location: [facility];  Service: Orthopedics;  Laterality: Left;  ANESTHESIA:  REGIONAL/MONITOR ANESTHESIA  . HARDWARE REMOVAL Left 07/02/2017   Procedure: Removal of Deep Implants;  Surgeon: Wylene Simmer, MD;  Location: [facility];  Service: Orthopedics;  Laterality: Left;  . JOINT  REPLACEMENT     knee  . throat polyps    . TUBAL LIGATION      There were no vitals filed for this visit.  Subjective Assessment - 10/27/19 0941    Subjective  Relays felt bad after last sesison after BPPV treatment, had to go home and lay down due to dizziness, and nausea. So far today no dizziness and she feels fine but then became dizzy with rolling in bed and supine to sit.    Patient is accompained by:  Family member    Pertinent History  COPD, HTN, Lt foot arthrodesis July 2018:  c/o low back pain due to radiculopathy lumbar region; OA hips & knees    Patient Stated Goals  resolve the vertigo, improve balance    Currently in Pain?  No/denies                       [facility] Adult PT Treatment/Exercise - 10/27/19 0001      Ambulation/Gait   Ambulation/Gait  Yes    Ambulation/Gait Assistance  6: Modified independent (Device/Increase time)    Ambulation Distance (Feet)  115 Feet    Assistive device  None    Gait Pattern  Step-to pattern;Trendelenburg;Lateral trunk lean to left      High Level Balance   High Level Balance Comments  sidestepping no UE support with  green band around knees in bars up/down X 3, then retro walk no UE support up/down X3, then tandem walk, balaance on airex feet together for 1 min, then feet apart eyes closed 20 sec X 3      Neuro Re-ed    Neuro Re-ed Details   attemped epply manuever one time for Rt ear (no dizziness or nystagmus when she layed back but then when turning her head to her Lt became very dizzy and sat up despite PT encouraging her to hold position.  Then had her perform gaze stabilization VOR L1 for 30 sec with head turns lateral and up/down, one rep ea with no dizziness reported.      Knee/Hip Exercises: Standing   Lateral Step Up  --      Knee/Hip Exercises: Supine   Hip Adduction Isometric  Both;1 set;20 reps    Bridges  Both;Strengthening;15 reps;2 sets;10 reps    Other Supine Knee/Hip Exercises  hooklying clams with  green theraband x 15 reps, then isometric clams with green X 15 on easch side      Knee/Hip Exercises: Sidelying   Clams  15 reps bilat             PT Education - 10/27/19 1118    Education Details  epply manuever and VOR exercises    Person(s) Educated  Patient    Methods  Explanation;Demonstration;Verbal cues;Handout    Comprehension  Verbalized understanding;Returned demonstration;Need further instruction          PT Long Term Goals - 10/17/19 1232      PT LONG TERM GOAL #1   Title  Pt will improve TUG score from 14.31 secs to </= 12.5 secs to reduce fall risk.    Baseline  14.31 secs    Time  4    Period  Weeks    Status  New    Target Date  11/16/19   Extend target date for LTG's due to inconsistent attendance     PT LONG TERM GOAL #2   Title  Increase gait velocity from 2.29 ft/sec to >/= 2.6 ft/sec without use of device.    Baseline  14.31 secs = 2.29 ft/sec without device    Time  4    Period  Weeks    Status  New    Target Date  11/16/19      PT LONG TERM GOAL #3   Title  Increase endurance/activity tolerance so pt able to amb. 5" nonstop with SPC or trekking pole for assistance with balance.    Time  4    Status  New    Target Date  11/16/19      PT LONG TERM GOAL #4   Title  Pt will improve balance so that she will be able to perform SLS on each leg at least 3 secs for incr. safety with stepping over objects, step negotiation, etc.    Time  4    Period  Weeks    Status  New    Target Date  11/16/19      PT LONG TERM GOAL #5   Title  Independent in HEP for balance & vestibular exercises.    Time  4    Period  Weeks    Status  New    Target Date  11/16/19            Plan - 10/27/19 1119    Clinical Impression Statement  No dizzineness upon arrival but became dizzy  with bed mobility, trialed epply manuever for Rt ear,(had no nystagmus transitioning from sit to supine but then when turning her head back to right she became very dizzy and  sat up despite PT encouraging her to maintain the position until symptoms resolve. She defered further BPPV treaments but did agree to balance training, leg strength, and gaze stabilization. PT also checked othostatics which was negative and BP actually increased as follows: BP supine 116/77, moved to sitting 132/83, moved to standing 137/89    Personal Factors and Comorbidities  Comorbidity 2;Fitness;Time since onset of injury/illness/exacerbation;Past/Current Experience    Examination-Activity Limitations  Squat;Stairs;Locomotion Level;Stand;Transfers    Examination-Participation Restrictions  Cleaning;Meal Prep;Community Activity;Interpersonal Relationship;Shop    Rehab Potential  Good    PT Frequency  2x / week    PT Duration  4 weeks    PT Treatment/Interventions  ADLs/Self Care Home Management;Neuromuscular re-education;Patient/family education;Therapeutic exercise;Gait training;Stair training;Therapeutic activities;Vestibular;Balance training;Orthotic Fit/Training    PT Next Visit Plan  suzanne I'm not sure if this is BPPV or horizontal or central, you may need to further evaluate as he continues to get dizzy with turning her head supine, with rolling, and with supine to sit.  Continue balance and strength.    PT Home Exercise Plan  Access Code: [email or web address] URL: [email or web address]    Consulted and Agree with Plan of Care  Patient       Patient will benefit from skilled therapeutic intervention in order to improve the following deficits and impairments:  Difficulty walking, Decreased balance, Decreased strength, Dizziness, Decreased activity tolerance  Visit Diagnosis: Unsteadiness on feet  Other abnormalities of gait and mobility  Dizziness and giddiness     Problem List Patient Active Problem List   Diagnosis Date Noted  . DOE (dyspnea on exertion) 09/20/2019  . Emphysema of lung ([place]) 09/20/2019  . Gait abnormality 09/20/2019  . Aneurysm of ascending aorta (HCC)  06/11/2018  . Essential hypertension 11/20/2017  . CAD (coronary artery disease), native coronary artery 11/20/2017  . Tobacco abuse 11/20/2017    Silvestre Mesi 10/27/2019, 11:36 AM  [facility] [address] [place], [place], [address] Phone: [phone]   Fax:  [phone]  Name: Vear Mcaulay MRN: [id number] Date of Birth: [birth year]

## 2019-10-31 DIAGNOSIS — Z1211 Encounter for screening for malignant neoplasm of colon: Secondary | ICD-10-CM | POA: Diagnosis not present

## 2019-10-31 DIAGNOSIS — R748 Abnormal levels of other serum enzymes: Secondary | ICD-10-CM | POA: Diagnosis not present

## 2019-11-02 ENCOUNTER — Telehealth: Payer: Self-pay

## 2019-11-02 DIAGNOSIS — F17210 Nicotine dependence, cigarettes, uncomplicated: Secondary | ICD-10-CM

## 2019-11-02 DIAGNOSIS — Z87891 Personal history of nicotine dependence: Secondary | ICD-10-CM

## 2019-11-02 DIAGNOSIS — Z122 Encounter for screening for malignant neoplasm of respiratory organs: Secondary | ICD-10-CM

## 2019-11-02 NOTE — Telephone Encounter (Signed)
I called [name] back and spoke with her husband. She is scheduled on 11/15/2019 @ 10:00am at [place] location

## 2019-11-02 NOTE — Telephone Encounter (Signed)
[name], can you call pt to schedule Ct? New order has been placed.

## 2019-11-04 DIAGNOSIS — H401131 Primary open-angle glaucoma, bilateral, mild stage: Secondary | ICD-10-CM | POA: Diagnosis not present

## 2019-11-04 DIAGNOSIS — Z961 Presence of intraocular lens: Secondary | ICD-10-CM | POA: Diagnosis not present

## 2019-11-04 DIAGNOSIS — I10 Essential (primary) hypertension: Secondary | ICD-10-CM | POA: Diagnosis not present

## 2019-11-04 DIAGNOSIS — H04123 Dry eye syndrome of bilateral lacrimal glands: Secondary | ICD-10-CM | POA: Diagnosis not present

## 2019-11-08 DIAGNOSIS — W182XXD Fall in (into) shower or empty bathtub, subsequent encounter: Secondary | ICD-10-CM | POA: Diagnosis not present

## 2019-11-08 DIAGNOSIS — M5136 Other intervertebral disc degeneration, lumbar region: Secondary | ICD-10-CM | POA: Diagnosis not present

## 2019-11-08 DIAGNOSIS — Y92012 Bathroom of single-family (private) house as the place of occurrence of the external cause: Secondary | ICD-10-CM | POA: Diagnosis not present

## 2019-11-08 DIAGNOSIS — I129 Hypertensive chronic kidney disease with stage 1 through stage 4 chronic kidney disease, or unspecified chronic kidney disease: Secondary | ICD-10-CM | POA: Diagnosis not present

## 2019-11-08 DIAGNOSIS — N183 Chronic kidney disease, stage 3 unspecified: Secondary | ICD-10-CM | POA: Diagnosis not present

## 2019-11-08 DIAGNOSIS — R42 Dizziness and giddiness: Secondary | ICD-10-CM | POA: Diagnosis not present

## 2019-11-08 DIAGNOSIS — S7001XA Contusion of right hip, initial encounter: Secondary | ICD-10-CM | POA: Diagnosis not present

## 2019-11-15 ENCOUNTER — Ambulatory Visit: Payer: Medicare Other

## 2019-11-17 ENCOUNTER — Encounter: Payer: Self-pay | Admitting: Physical Therapy

## 2019-11-17 ENCOUNTER — Other Ambulatory Visit: Payer: Self-pay

## 2019-11-17 ENCOUNTER — Ambulatory Visit: Payer: Medicare Other | Attending: Physician Assistant | Admitting: Physical Therapy

## 2019-11-17 DIAGNOSIS — R2689 Other abnormalities of gait and mobility: Secondary | ICD-10-CM | POA: Insufficient documentation

## 2019-11-17 DIAGNOSIS — R42 Dizziness and giddiness: Secondary | ICD-10-CM | POA: Diagnosis present

## 2019-11-17 DIAGNOSIS — R2681 Unsteadiness on feet: Secondary | ICD-10-CM | POA: Insufficient documentation

## 2019-11-17 DIAGNOSIS — H8111 Benign paroxysmal vertigo, right ear: Secondary | ICD-10-CM | POA: Diagnosis not present

## 2019-11-17 NOTE — Patient Instructions (Signed)
Gaze Stabilization: Sitting    Keeping eyes on target on wall 3 feet away, and move head side to side for _30-60___ seconds. Repeat while moving head up and down for __30-60__ seconds. Do __2-3__ sessions per day.  Copyright  VHI. All rights reserved.   Gaze Stabilization: Tip Card  1.Target must remain in focus, not blurry, and appear stationary while head is in motion. 2.Perform exercises with small head movements (45 to either side of midline). 3.Increase speed of head motion so long as target is in focus. 4.If you wear eyeglasses, be sure you can see target through lens (therapist will give specific instructions for bifocal / progressive lenses). 5.These exercises may provoke dizziness or nausea. Work through these symptoms. If too dizzy, slow head movement slightly. Rest between each exercise. 6.Exercises demand concentration; avoid distractions.  Copyright  VHI. All rights reserved.     

## 2019-11-17 NOTE — Therapy (Signed)
[facility] [address] [place], [place], [address] Phone: [phone]   Fax:  [phone]  Physical Therapy Treatment  Patient Details  Name: [name] MRN: [id number] Date of Birth: 10-11-1941 Referring Provider (PT): Jolene Provost, [place]   Encounter Date: 11/17/2019  PT End of Session - 11/17/19 1157    Visit Number  6    Number of Visits  14    Date for PT Re-Evaluation  [id number]   per recert   Authorization Type  Medicare/Tricare    Authorization Time Period  --    PT Start Time  0938    PT Stop Time  1021    PT Time Calculation (min)  43 min    Activity Tolerance  Patient tolerated treatment well    Behavior During Therapy  [facility] for tasks assessed/performed       Past Medical History:  Diagnosis Date  . 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  . Arthritis   . Asymptomatic coronary heart disease 11/20/2017  . 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 COPD (chronic obstructive pulmonary disease) ([place])   . Crohn's disease ([place])   . Essential hypertension 11/20/2017  . Gait difficulty   . OAB (overactive bladder)   . Smoker   . Tobacco abuse 11/20/2017  . Vertigo     Past Surgical History:  Procedure Laterality Date  . BLADDER SURGERY    . CHOLECYSTECTOMY    . FOOT ARTHRODESIS Left 07/02/2017   Procedure: Left Subtalar Arthrodesis;  Surgeon: Wylene Simmer, MD;  Location: [place];  Service: Orthopedics;  Laterality: Left;  . FRACTURE SURGERY    . HARDWARE REMOVAL Left 04/24/2016   Procedure: REMOVAL OF DEEP IMPLANT LEFT FOOT;  Surgeon: Wylene Simmer, MD;  Location: [place];  Service: Orthopedics;  Laterality: Left;  ANESTHESIA:  REGIONAL/MONITOR ANESTHESIA  . HARDWARE REMOVAL Left 07/02/2017   Procedure: Removal of Deep Implants;  Surgeon: Wylene Simmer, MD;  Location: [place];  Service: Orthopedics;  Laterality: Left;  . JOINT  REPLACEMENT     knee  . throat polyps    . TUBAL LIGATION      There were no vitals filed for this visit.  Subjective Assessment - 11/17/19 0942    Subjective  Has had two days in bed due to dizziness.  Feeling dizzy this morning but decided to come on in.    Patient is accompained by:  Family member    Pertinent History  COPD, HTN, Lt foot arthrodesis July 2018:  c/o low back pain due to radiculopathy lumbar region; OA hips & knees    Patient Stated Goals  resolve the vertigo, improve balance    Currently in Pain?  No/denies         [facility] PT Assessment - 11/17/19 1156      Assessment   Medical Diagnosis  Vertigo; Imbalance    Referring Provider (PT)  Jolene Provost, PA-C    Onset Date/Surgical Date  --   March 2020     Precautions   Precautions  Fall      Prior Function   Level of Independence  Independent with basic ADLs;Independent with homemaking with ambulation;Independent with gait;Independent with transfers         Vestibular Assessment - 11/17/19 0943      Symptom Behavior   Subjective history of current problem  Got up in the middle of the night  and got really dizzy and very nauseous.  Went back to bed and stayed very still to avoid vomiting.      Oculomotor Exam   Oculomotor Alignment  Normal    Ocular ROM  WFL    Spontaneous  Absent    Gaze-induced   Absent    Smooth Pursuits  Intact    Saccades  Intact    Comment  - test of skew      Oculomotor Exam-Fixation Suppressed    Left Head Impulse  negative    Right Head Impulse  negative      Vestibulo-Ocular Reflex   VOR to Slow Head Movement  Normal    VOR Cancellation  Normal      Positional Testing   Dix-Hallpike  Dix-Hallpike Right;Dix-Hallpike Left    Horizontal Canal Testing  Horizontal Canal Right;Horizontal Canal Left      Dix-Hallpike Right   Dix-Hallpike Right Duration  15 seconds    Dix-Hallpike Right Symptoms  No nystagmus      Dix-Hallpike Left   Dix-Hallpike Left Duration  0     Dix-Hallpike Left Symptoms  No nystagmus      Horizontal Canal Right   Horizontal Canal Right Duration  0    Horizontal Canal Right Symptoms  Normal      Horizontal Canal Left   Horizontal Canal Left Duration  0    Horizontal Canal Left Symptoms  Normal                Vestibular Treatment/Exercise - 11/17/19 1013      Vestibular Treatment/Exercise   Vestibular Treatment Provided  Canalith Repositioning;Gaze    Canalith Repositioning  Epley Manuever Right    Gaze Exercises  X1 Viewing Horizontal;X1 Viewing Vertical       EPLEY MANUEVER RIGHT   Number of Reps   2    Overall Response  Improved Symptoms    Response Details   no symptoms during second manuever until pt returned to sitting      X1 Viewing Horizontal   Foot Position  seated    Reps  1    Comments  30 seconds      X1 Viewing Vertical   Foot Position  seated    Reps  1    Comments  30 seconds            PT Education - 11/17/19 1153    Education Details  multiple causes of vertigo and BPPV as possible cause.  due to significant nausea and inability to tolerate certain testing positions recommended that pt discuss options with physician for symptoms management.  reviewed VOR exercise again due to pt not clear on how to perform    Person(s) Educated  Patient    Methods  Explanation;Demonstration;Handout    Comprehension  Verbalized understanding;Returned demonstration          PT Long Term Goals - 11/17/19 1158      PT LONG TERM GOAL #1   Title  Pt will improve TUG score from 14.31 secs to </= 12.5 secs to reduce fall risk.    Baseline  14.31 secs    Time  4    Period  Weeks    Status  On-going    Target Date  12/17/19      PT LONG TERM GOAL #2   Title  Increase gait velocity from 2.29 ft/sec to >/= 2.6 ft/sec without use of device.    Baseline  14.31 secs = 2.29 ft/sec  without device    Time  4    Period  Weeks    Status  On-going    Target Date  12/17/19      PT LONG TERM GOAL #3    Title  Increase endurance/activity tolerance so pt able to amb. 5" nonstop with SPC or trekking pole for assistance with balance.    Time  4    Status  On-going    Target Date  12/17/19      PT LONG TERM GOAL #4   Title  Pt will improve balance so that she will be able to perform SLS on each leg at least 3 secs for incr. safety with stepping over objects, step negotiation, etc.    Time  4    Period  Weeks    Status  On-going    Target Date  12/17/19      PT LONG TERM GOAL #5   Title  Independent in HEP for balance & vestibular exercises.    Time  4    Period  Weeks    Status  On-going    Target Date  12/17/19      Additional Long Term Goals   Additional Long Term Goals  Yes      PT LONG TERM GOAL #6   Title  Pt will demonstrate negative positional testing and report no dizziness during rolling and supine <> sit    Time  4    Period  Weeks    Status  New    Target Date  12/17/19            Plan - 11/17/19 1200    Clinical Impression Statement  Pt continues to experience significant vertigo with positional changes and mobility which has significantly limited patient's safety and mobility over the past few weeks. Pt also continues to experience nausea with vertigo.  Unable to assess most LTG today due to focus on re-assessment and treatment of vestibular system.  Pt only able to tolerate testing if performed slowly  but did report vertigo during R hallpike-dix position - treated x 2 with CRM with improvement in symptoms during sit > supine but return of dizziness upon sitting upright and then when ambulating.  Reviewed x1 viewing.  Pt will benefit from continued skilled PT services to address these ongoing balance and vestibular impairments to maximize functional mobility independence and decrease falls risk.    Personal Factors and Comorbidities  Comorbidity 2;Fitness;Time since onset of injury/illness/exacerbation;Past/Current Experience    Comorbidities  Aorta aneurysm  06-11-18:  CAD:  COPD:  Crohn's disease:  HTN:  vertigo:  Lt foot arthrodesis 07-02-17    Examination-Activity Limitations  Squat;Stairs;Locomotion Level;Stand;Transfers    Examination-Participation Restrictions  Cleaning;Meal Prep;Community Activity;Interpersonal Relationship;Shop    Rehab Potential  Good    PT Frequency  2x / week    PT Duration  4 weeks    PT Treatment/Interventions  ADLs/Self Care Home Management;Neuromuscular re-education;Patient/family education;Therapeutic exercise;Gait training;Stair training;Therapeutic activities;Vestibular;Balance training;Orthotic Fit/Training;Canalith Repostioning;Functional mobility training    PT Next Visit Plan  Recert done on [id number] for 4 weeks.  Pt was going to ask MD about anti-nausea medication to help tolerate treatment.  reassess for R BPPV and treat as indicated.  Continue to review and progress x 1 viewing.  Combine and condense balance HEP.    PT Home Exercise Plan  Access Code: [email or web address] URL: [email or web address]    Consulted and Agree with Plan of Care  Patient  Patient will benefit from skilled therapeutic intervention in order to improve the following deficits and impairments:  Difficulty walking, Decreased balance, Decreased strength, Dizziness, Decreased activity tolerance  Visit Diagnosis: BPPV (benign paroxysmal positional vertigo), right  Dizziness and giddiness  Other abnormalities of gait and mobility  Unsteadiness on feet     Problem List Patient Active Problem List   Diagnosis Date Noted  . DOE (dyspnea on exertion) 09/20/2019  . Emphysema of lung ([place]) 09/20/2019  . Gait abnormality 09/20/2019  . 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 Essential hypertension 11/20/2017  . CAD (coronary artery disease), native coronary artery 11/20/2017  . Tobacco abuse 11/20/2017    Rico Junker, PT, DPT 11/17/19    12:07 PM    [facility] [address] [place], [place], [address] Phone: [phone]   Fax:  [phone]  Name: [name] MRN: [id number] Date of Birth: 1941/11/01

## 2019-11-21 ENCOUNTER — Ambulatory Visit: Payer: Medicare Other | Admitting: Physical Therapy

## 2019-11-21 ENCOUNTER — Other Ambulatory Visit: Payer: Self-pay

## 2019-11-21 DIAGNOSIS — H8111 Benign paroxysmal vertigo, right ear: Secondary | ICD-10-CM | POA: Diagnosis not present

## 2019-11-21 DIAGNOSIS — R2689 Other abnormalities of gait and mobility: Secondary | ICD-10-CM

## 2019-11-21 DIAGNOSIS — R2681 Unsteadiness on feet: Secondary | ICD-10-CM

## 2019-11-21 DIAGNOSIS — R42 Dizziness and giddiness: Secondary | ICD-10-CM

## 2019-11-21 NOTE — Patient Instructions (Signed)
Standing Marching   Using a chair if necessary, march in place. Repeat 10 times. Do 1 sessions per day.    Hip Backward Kick   Using a chair for balance, keep legs shoulder width apart and toes pointed for- ward. Slowly extend one leg back, keeping knee straight. Do not lean forward. Repeat with other leg. Repeat 10 times alternating legs.  Do 1 session per day.  [email or web address].[email or web address]    Hip Side Kick   Holding a chair for balance, keep legs shoulder width apart and toes pointed forward. Swing a leg out to side, keeping knee straight. Do not lean. Repeat using other leg. Repeat 10 times each leg. Do 1 sessions per day.   ALTERNATE LEGS  ALSO DO FORWARD KICKS - 10 times each leg alternating   Feet Heel-Toe "Tandem", Varied Arm Positions - Eyes Open   With eyes open, right foot directly in front of the other, arms out, look straight ahead at a stationary object. Hold 30 seconds. Repeat 2  times per session (change position). Do 1 sessions per day.      Standing On One Leg Without Support .  Stand on one leg in neutral spine without support. Hold 10 seconds. Repeat on other leg. Do 2 repetitions, 1 sets.     Braiding   Move to side: 1) cross right leg in front of left, 2) bring back leg out to side, then 3) cross right leg behind left, 4) bring left leg out to side. Continue sequence in same direction. Reverse sequence, moving in opposite direction. Repeat sequence 2-3  times per session. Do 1 sessions per day.

## 2019-11-22 ENCOUNTER — Encounter: Payer: Self-pay | Admitting: Physical Therapy

## 2019-11-22 NOTE — Therapy (Signed)
[facility] [address] [place], [place], [address] Phone: [phone]   Fax:  [phone]  Physical Therapy Treatment  Patient Details  Name: [name] MRN: [id number] Date of Birth: 10/06/1941 Referring Provider (PT): Jolene Provost, [place]   Encounter Date: 11/21/2019  PT End of Session - 11/22/19 1638    Visit Number  7    Number of Visits  14    Date for PT Re-Evaluation  [id number]   per recert   Authorization Type  Medicare/Tricare    PT Start Time  0932    PT Stop Time  1016    PT Time Calculation (min)  44 min    Activity Tolerance  Patient tolerated treatment well    Behavior During Therapy  [facility] for tasks assessed/performed       Past Medical History:  Diagnosis Date  . 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  . Arthritis   . Asymptomatic coronary heart disease 11/20/2017  . 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 COPD (chronic obstructive pulmonary disease) ([place])   . Crohn's disease ([place])   . Essential hypertension 11/20/2017  . Gait difficulty   . OAB (overactive bladder)   . Smoker   . Tobacco abuse 11/20/2017  . Vertigo     Past Surgical History:  Procedure Laterality Date  . BLADDER SURGERY    . CHOLECYSTECTOMY    . FOOT ARTHRODESIS Left 07/02/2017   Procedure: Left Subtalar Arthrodesis;  Surgeon: Wylene Simmer, MD;  Location: [place];  Service: Orthopedics;  Laterality: Left;  . FRACTURE SURGERY    . HARDWARE REMOVAL Left 04/24/2016   Procedure: REMOVAL OF DEEP IMPLANT LEFT FOOT;  Surgeon: Wylene Simmer, MD;  Location: [place];  Service: Orthopedics;  Laterality: Left;  ANESTHESIA:  REGIONAL/MONITOR ANESTHESIA  . HARDWARE REMOVAL Left 07/02/2017   Procedure: Removal of Deep Implants;  Surgeon: Wylene Simmer, MD;  Location: [place];  Service: Orthopedics;  Laterality: Left;  . JOINT REPLACEMENT     knee  . throat  polyps    . TUBAL LIGATION      There were no vitals filed for this visit.  Subjective Assessment - 11/21/19 0930    Subjective  Pt states she was dizzy on Saturday but took meclizine which made her sleepy (went to sleep for quite hile) so she has stopped taking it; pt reports no dizziness yesterday (on Sunday) and states she feels well today    Patient is accompained by:  Family member    Pertinent History  COPD, HTN, Lt foot arthrodesis July 2018:  c/o low back pain due to radiculopathy lumbar region; OA hips & knees    Patient Stated Goals  resolve the vertigo, improve balance    Currently in Pain?  No/denies             Vestibular Assessment - 11/22/19 0001      Positional Testing   Dix-Hallpike  Dix-Hallpike Right;Dix-Hallpike Left    Sidelying Test  Sidelying Right;Sidelying Left      Dix-Hallpike Right   Dix-Hallpike Right Duration  none    Dix-Hallpike Right Symptoms  No nystagmus      Dix-Hallpike Left   Dix-Hallpike Left Duration  none    Dix-Hallpike Left Symptoms  No nystagmus      Sidelying Right   Sidelying Right Duration  none    Sidelying Right Symptoms  No nystagmus  Sidelying Left   Sidelying Left Duration  none    Sidelying Left Symptoms  No nystagmus            NeuroRe-ed:  Pt performed standing balance exercises - kicks (forward, back and side), marching in place, tandem stance  And SLS for HEP; UE support used for safety - these exercises issued for HEP  Previous exercises given for HEP were reviewed and condensed based on prioritization           PT Education - 11/22/19 1636    Education Details  balance HEP - pt used minimal UE support for safety    Person(s) Educated  Patient    Methods  Explanation;Demonstration;Handout    Comprehension  Verbalized understanding;Returned demonstration          PT Long Term Goals - 11/22/19 1643      PT LONG TERM GOAL #1   Title  Pt will improve TUG score from 14.31 secs to </=  12.5 secs to reduce fall risk.    Baseline  14.31 secs    Time  4    Period  Weeks    Status  On-going      PT LONG TERM GOAL #2   Title  Increase gait velocity from 2.29 ft/sec to >/= 2.6 ft/sec without use of device.    Baseline  14.31 secs = 2.29 ft/sec without device    Time  4    Period  Weeks    Status  On-going      PT LONG TERM GOAL #3   Title  Increase endurance/activity tolerance so pt able to amb. 5" nonstop with SPC or trekking pole for assistance with balance.    Time  4    Status  On-going      PT LONG TERM GOAL #4   Title  Pt will improve balance so that she will be able to perform SLS on each leg at least 3 secs for incr. safety with stepping over objects, step negotiation, etc.    Time  4    Period  Weeks    Status  On-going      PT LONG TERM GOAL #5   Title  Independent in HEP for balance & vestibular exercises.    Time  4    Period  Weeks    Status  On-going      PT LONG TERM GOAL #6   Title  Pt will demonstrate negative positional testing and report no dizziness during rolling and supine <> sit    Time  4    Period  Weeks    Status  New            Plan - 11/21/19 0934    Clinical Impression Statement  Pt reported no dizziness with any positional testing and no nystagmus was noted; pt had (-) Rt Dix-Hallpike test with no nystagmus and no c/o vertigo, indicative of resolution of Rt BPPV; pt had minimal LOB with balance exercises issued for HEP with minimal UE support used    PT Treatment/Interventions  ADLs/Self Care Home Management;Neuromuscular re-education;Patient/family education;Therapeutic exercise;Gait training;Stair training;Therapeutic activities;Vestibular;Balance training;Orthotic Fit/Training;Canalith Repostioning;Functional mobility training    PT Next Visit Plan  check balance HEP - D/C if no problems persist    PT Home Exercise Plan  Access Code: [email or web address] URL: [email or web address]       Patient will benefit from  skilled therapeutic intervention in order to improve the following deficits and impairments:  Visit Diagnosis: Dizziness and giddiness  Other abnormalities of gait and mobility  Unsteadiness on feet     Problem List Patient Active Problem List   Diagnosis Date Noted  . DOE (dyspnea on exertion) 09/20/2019  . Emphysema of lung ([place]) 09/20/2019  . Gait abnormality 09/20/2019  . 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 Essential hypertension 11/20/2017  . CAD (coronary artery disease), native coronary artery 11/20/2017  . Tobacco abuse 11/20/2017    Alda Lea, PT 11/22/2019, 4:48 PM  [facility] [address] [place], [place], [address] Phone: [phone]   Fax:  [phone]  Name: Ione Menear MRN: [id number] Date of Birth: 06/29/1941

## 2019-11-24 ENCOUNTER — Ambulatory Visit: Payer: Medicare Other | Admitting: Physical Therapy

## 2019-11-24 ENCOUNTER — Other Ambulatory Visit: Payer: Self-pay

## 2019-11-24 ENCOUNTER — Encounter: Payer: Self-pay | Admitting: Physical Therapy

## 2019-11-24 DIAGNOSIS — R42 Dizziness and giddiness: Secondary | ICD-10-CM

## 2019-11-24 DIAGNOSIS — R2681 Unsteadiness on feet: Secondary | ICD-10-CM

## 2019-11-24 DIAGNOSIS — H8111 Benign paroxysmal vertigo, right ear: Secondary | ICD-10-CM | POA: Diagnosis not present

## 2019-11-24 NOTE — Therapy (Signed)
[facility] [address] [place], [place], [address] Phone: [phone]   Fax:  [phone]  Physical Therapy Treatment & Discharge Summary  Patient Details  Name: [name] MRN: [id number] Date of Birth: [birth year] Referring Provider (PT): Jolene Provost, [place]   Encounter Date: 11/24/2019  PT End of Session - 11/24/19 2035    Visit Number  8    Number of Visits  14    Date for PT Re-Evaluation  53/97/67   per recert   Authorization Type  Medicare/Tricare    PT Start Time  0935    PT Stop Time  1000   session ended early - pt D/C'd   PT Time Calculation (min)  25 min    Activity Tolerance  Patient tolerated treatment well    Behavior During Therapy  [facility] for tasks assessed/performed       Past Medical History:  Diagnosis Date  . 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  . Arthritis   . Asymptomatic coronary heart disease 11/20/2017  . Cancer [facility]) 2004   bladder   . COPD (chronic obstructive pulmonary disease) ([place])   . Crohn's disease ([place])   . Essential hypertension 11/20/2017  . Gait difficulty   . OAB (overactive bladder)   . Smoker   . Tobacco abuse 11/20/2017  . Vertigo     Past Surgical History:  Procedure Laterality Date  . BLADDER SURGERY    . CHOLECYSTECTOMY    . FOOT ARTHRODESIS Left 07/02/2017   Procedure: Left Subtalar Arthrodesis;  Surgeon: Wylene Simmer, MD;  Location: [place];  Service: Orthopedics;  Laterality: Left;  . FRACTURE SURGERY    . HARDWARE REMOVAL Left 04/24/2016   Procedure: REMOVAL OF DEEP IMPLANT LEFT FOOT;  Surgeon: Wylene Simmer, MD;  Location: [facility];  Service: Orthopedics;  Laterality: Left;  ANESTHESIA:  REGIONAL/MONITOR ANESTHESIA  . HARDWARE REMOVAL Left 07/02/2017   Procedure: Removal of Deep Implants;  Surgeon: Wylene Simmer, MD;  Location: [facility];  Service: Orthopedics;  Laterality:  Left;  . JOINT REPLACEMENT     knee  . throat polyps    . TUBAL LIGATION      There were no vitals filed for this visit.  Subjective Assessment - 11/24/19 0936    Subjective  Pt states the dizziness is still resolved; feels that she can be discharged today    Patient Stated Goals  resolve the vertigo, improve balance    Currently in Pain?  No/denies                       [facility] Adult PT Treatment/Exercise - 11/24/19 0001      Ambulation/Gait   Ambulation/Gait  Yes    Gait velocity  9.00 = 3.64 ft/sec       Standardized Balance Assessment   Standardized Balance Assessment  Timed Up and Go Test      Timed Up and Go Test   TUG  Normal TUG    Normal TUG (seconds)  12.88      Reviewed balance HEP as previously issued - pt reports no problems or questions with this HEP  Pt reports no dizziness at this time - states vertigo remains resolved - pt states she is doing well and feels she is ready for D/C            PT Long Term Goals - 11/24/19 3419  PT LONG TERM GOAL #1   Title  Pt will improve TUG score from 14.31 secs to </= 12.5 secs to reduce fall risk.    Baseline  14.31 secs; 12.88 secs on 11-24-19    Time  4    Period  Weeks    Status  Achieved      PT LONG TERM GOAL #2   Title  Increase gait velocity from 2.29 ft/sec to >/= 2.6 ft/sec without use of device.    Baseline  14.31 secs = 2.29 ft/sec without device; 9.00 secs =3.64 ft/sec on 11-24-19    Time  4    Period  Weeks    Status  Achieved      PT LONG TERM GOAL #3   Title  Increase endurance/activity tolerance so pt able to amb. 5" nonstop with SPC or trekking pole for assistance with balance.    Baseline  met per pt report - pt states she could easily do this when her hip isn't hurting - 11-24-19    Time  4    Status  Achieved      PT LONG TERM GOAL #4   Title  Pt will improve balance so that she will be able to perform SLS on each leg at least 3 secs for incr. safety with  stepping over objects, step negotiation, etc.    Baseline  RLE = 11.31 secs; LLE = 3.22 secs - 11-24-19    Time  4    Period  Weeks    Status  Achieved      PT LONG TERM GOAL #5   Title  Independent in HEP for balance & vestibular exercises.    Baseline  met 11-24-19    Time  4    Period  Weeks    Status  Achieved      PT LONG TERM GOAL #6   Title  Pt will demonstrate negative positional testing and report no dizziness during rolling and supine <> sit    Time  4    Period  Weeks    Status  Achieved            Plan - 11/24/19 2036    Clinical Impression Statement  Pt reports dizziness remains resolved and states she feels she is ready for discharge due to being pleased with current status and no dizziness at this time.  Pt has met all LTG's.    PT Treatment/Interventions  ADLs/Self Care Home Management;Neuromuscular re-education;Patient/family education;Therapeutic exercise;Gait training;Stair training;Therapeutic activities;Vestibular;Balance training;Orthotic Fit/Training;Canalith Repostioning;Functional mobility training    PT Next Visit Plan  D/C on 11-24-19    PT Home Exercise Plan  Access Code: [email or web address] URL: [email or web address]       Patient will benefit from skilled therapeutic intervention in order to improve the following deficits and impairments:     Visit Diagnosis: Dizziness and giddiness  Unsteadiness on feet     Problem List Patient Active Problem List   Diagnosis Date Noted  . DOE (dyspnea on exertion) 09/20/2019  . Emphysema of lung ([place]) 09/20/2019  . Gait abnormality 09/20/2019  . 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 Essential hypertension 11/20/2017  . CAD (coronary artery disease), native coronary artery 11/20/2017  . Tobacco abuse 11/20/2017      PHYSICAL THERAPY DISCHARGE SUMMARY  Visits from Start of Care: 8  Current functional level related to goals / functional outcomes: See above for progress towards  goals - all goals met; pt states vertigo has resolved at  this time   Remaining deficits: None regarding vertigo at current time Pt demonstrates age related decreased high level balance skills   Education / Equipment: Pt has been instructed in balance HEP and reports compliance with this HEP. Plan: Patient agrees to discharge.  Patient goals were met. Patient is being discharged due to meeting the stated rehab goals.  ?????         Alda Lea, PT 11/24/2019, 8:44 PM  [facility] [address] [place], [place], [address] Phone: [phone]   Fax:  [phone]  Name: [name] MRN: [id number] Date of Birth: 12/04/1941

## 2019-11-28 ENCOUNTER — Ambulatory Visit: Payer: Medicare Other | Admitting: Physical Therapy

## 2019-12-01 ENCOUNTER — Ambulatory Visit: Payer: Medicare Other | Admitting: Physical Therapy

## 2019-12-15 DIAGNOSIS — I2510 Atherosclerotic heart disease of native coronary artery without angina pectoris: Secondary | ICD-10-CM | POA: Diagnosis not present

## 2019-12-15 DIAGNOSIS — J449 Chronic obstructive pulmonary disease, unspecified: Secondary | ICD-10-CM | POA: Diagnosis not present

## 2019-12-15 DIAGNOSIS — N1831 Chronic kidney disease, stage 3a: Secondary | ICD-10-CM | POA: Diagnosis not present

## 2019-12-15 DIAGNOSIS — I129 Hypertensive chronic kidney disease with stage 1 through stage 4 chronic kidney disease, or unspecified chronic kidney disease: Secondary | ICD-10-CM | POA: Diagnosis not present

## 2019-12-15 DIAGNOSIS — M810 Age-related osteoporosis without current pathological fracture: Secondary | ICD-10-CM | POA: Diagnosis not present

## 2019-12-19 DIAGNOSIS — H04123 Dry eye syndrome of bilateral lacrimal glands: Secondary | ICD-10-CM | POA: Diagnosis not present

## 2019-12-19 DIAGNOSIS — H401131 Primary open-angle glaucoma, bilateral, mild stage: Secondary | ICD-10-CM | POA: Diagnosis not present

## 2019-12-19 DIAGNOSIS — H0016 Chalazion left eye, unspecified eyelid: Secondary | ICD-10-CM | POA: Diagnosis not present

## 2019-12-19 DIAGNOSIS — I10 Essential (primary) hypertension: Secondary | ICD-10-CM | POA: Diagnosis not present

## 2020-01-10 DIAGNOSIS — J449 Chronic obstructive pulmonary disease, unspecified: Secondary | ICD-10-CM | POA: Diagnosis not present

## 2020-01-10 DIAGNOSIS — N183 Chronic kidney disease, stage 3 unspecified: Secondary | ICD-10-CM | POA: Diagnosis not present

## 2020-01-10 DIAGNOSIS — M810 Age-related osteoporosis without current pathological fracture: Secondary | ICD-10-CM | POA: Diagnosis not present

## 2020-01-10 DIAGNOSIS — I2510 Atherosclerotic heart disease of native coronary artery without angina pectoris: Secondary | ICD-10-CM | POA: Diagnosis not present

## 2020-01-10 DIAGNOSIS — I129 Hypertensive chronic kidney disease with stage 1 through stage 4 chronic kidney disease, or unspecified chronic kidney disease: Secondary | ICD-10-CM | POA: Diagnosis not present

## 2020-01-11 DIAGNOSIS — M533 Sacrococcygeal disorders, not elsewhere classified: Secondary | ICD-10-CM | POA: Diagnosis not present

## 2020-01-23 DIAGNOSIS — M810 Age-related osteoporosis without current pathological fracture: Secondary | ICD-10-CM | POA: Diagnosis not present

## 2020-01-23 DIAGNOSIS — E1169 Type 2 diabetes mellitus with other specified complication: Secondary | ICD-10-CM | POA: Diagnosis not present

## 2020-01-23 DIAGNOSIS — I2510 Atherosclerotic heart disease of native coronary artery without angina pectoris: Secondary | ICD-10-CM | POA: Diagnosis not present

## 2020-01-23 DIAGNOSIS — J449 Chronic obstructive pulmonary disease, unspecified: Secondary | ICD-10-CM | POA: Diagnosis not present

## 2020-01-23 DIAGNOSIS — I129 Hypertensive chronic kidney disease with stage 1 through stage 4 chronic kidney disease, or unspecified chronic kidney disease: Secondary | ICD-10-CM | POA: Diagnosis not present

## 2020-01-23 DIAGNOSIS — Z7984 Long term (current) use of oral hypoglycemic drugs: Secondary | ICD-10-CM | POA: Diagnosis not present

## 2020-01-23 DIAGNOSIS — N183 Chronic kidney disease, stage 3 unspecified: Secondary | ICD-10-CM | POA: Diagnosis not present

## 2020-01-24 ENCOUNTER — Ambulatory Visit
Admission: RE | Admit: 2020-01-24 | Discharge: 2020-01-24 | Disposition: A | Discharge status: Home / Self Care | Payer: Medicare Other | Source: Ambulatory Visit | Attending: Family Medicine | Admitting: Family Medicine

## 2020-01-24 ENCOUNTER — Other Ambulatory Visit: Payer: Self-pay

## 2020-01-24 DIAGNOSIS — Z780 Asymptomatic menopausal state: Secondary | ICD-10-CM | POA: Diagnosis not present

## 2020-01-24 DIAGNOSIS — Z13820 Encounter for screening for osteoporosis: Secondary | ICD-10-CM | POA: Diagnosis not present

## 2020-01-24 DIAGNOSIS — M810 Age-related osteoporosis without current pathological fracture: Secondary | ICD-10-CM

## 2020-01-30 ENCOUNTER — Encounter: Payer: Self-pay | Admitting: Acute Care

## 2020-02-01 DIAGNOSIS — N3941 Urge incontinence: Secondary | ICD-10-CM | POA: Diagnosis not present

## 2020-02-01 DIAGNOSIS — C670 Malignant neoplasm of trigone of bladder: Secondary | ICD-10-CM | POA: Diagnosis not present

## 2020-02-01 DIAGNOSIS — B373 Candidiasis of vulva and vagina: Secondary | ICD-10-CM | POA: Diagnosis not present

## 2020-02-01 DIAGNOSIS — R7309 Other abnormal glucose: Secondary | ICD-10-CM | POA: Diagnosis not present

## 2020-02-01 DIAGNOSIS — R81 Glycosuria: Secondary | ICD-10-CM | POA: Diagnosis not present

## 2020-02-07 DIAGNOSIS — E1169 Type 2 diabetes mellitus with other specified complication: Secondary | ICD-10-CM | POA: Diagnosis not present

## 2020-02-07 DIAGNOSIS — R739 Hyperglycemia, unspecified: Secondary | ICD-10-CM | POA: Diagnosis not present

## 2020-02-09 ENCOUNTER — Ambulatory Visit: Payer: Medicare Other | Attending: Internal Medicine

## 2020-02-09 DIAGNOSIS — Z23 Encounter for immunization: Secondary | ICD-10-CM

## 2020-02-09 NOTE — Progress Notes (Signed)
   Covid-19 Vaccination Clinic  Name:  [name]    MRN: [id number] DOB: 05-06-1941  02/09/2020  [name] was observed post Covid-19 immunization for 15 minutes without incidence. She was provided with Vaccine Information Sheet and instruction to access the V-Safe system.   Ms. Downs was instructed to call 911 with any severe reactions post vaccine: [name] Difficulty breathing  . Swelling of your face and throat  . A fast heartbeat  . A bad rash all over your body  . Dizziness and weakness    Immunizations Administered    Name Date Dose VIS Date Route   Pfizer COVID-19 Vaccine 02/09/2020  2:06 PM 0.3 mL 11/25/2019 Intramuscular   Manufacturer: [place]   Lot: [id number]   [place]: [id number]

## 2020-02-10 DIAGNOSIS — M47816 Spondylosis without myelopathy or radiculopathy, lumbar region: Secondary | ICD-10-CM | POA: Diagnosis not present

## 2020-02-16 ENCOUNTER — Ambulatory Visit: Payer: Medicare Other

## 2020-02-16 DIAGNOSIS — M5030 Other cervical disc degeneration, unspecified cervical region: Secondary | ICD-10-CM | POA: Diagnosis not present

## 2020-02-16 DIAGNOSIS — E1169 Type 2 diabetes mellitus with other specified complication: Secondary | ICD-10-CM | POA: Diagnosis not present

## 2020-02-16 DIAGNOSIS — M47816 Spondylosis without myelopathy or radiculopathy, lumbar region: Secondary | ICD-10-CM | POA: Diagnosis not present

## 2020-02-16 DIAGNOSIS — M461 Sacroiliitis, not elsewhere classified: Secondary | ICD-10-CM | POA: Diagnosis not present

## 2020-02-29 DIAGNOSIS — C670 Malignant neoplasm of trigone of bladder: Secondary | ICD-10-CM | POA: Diagnosis not present

## 2020-02-29 DIAGNOSIS — B373 Candidiasis of vulva and vagina: Secondary | ICD-10-CM | POA: Diagnosis not present

## 2020-02-29 DIAGNOSIS — N3941 Urge incontinence: Secondary | ICD-10-CM | POA: Diagnosis not present

## 2020-03-01 DIAGNOSIS — E1140 Type 2 diabetes mellitus with diabetic neuropathy, unspecified: Secondary | ICD-10-CM | POA: Diagnosis not present

## 2020-03-01 DIAGNOSIS — I129 Hypertensive chronic kidney disease with stage 1 through stage 4 chronic kidney disease, or unspecified chronic kidney disease: Secondary | ICD-10-CM | POA: Diagnosis not present

## 2020-03-01 DIAGNOSIS — E1169 Type 2 diabetes mellitus with other specified complication: Secondary | ICD-10-CM | POA: Diagnosis not present

## 2020-03-06 ENCOUNTER — Ambulatory Visit: Payer: Medicare Other | Attending: Internal Medicine

## 2020-03-06 DIAGNOSIS — Z23 Encounter for immunization: Secondary | ICD-10-CM

## 2020-03-06 NOTE — Progress Notes (Signed)
   Covid-19 Vaccination Clinic  Name:  [name]    MRN: [id number] DOB: 1941/10/18  03/06/2020  [name] was observed post Covid-19 immunization for 15 minutes without incident. She was provided with Vaccine Information Sheet and instruction to access the V-Safe system.   [name] was instructed to call 911 with any severe reactions post vaccine: [name] Difficulty breathing  . Swelling of face and throat  . A fast heartbeat  . A bad rash all over body  . Dizziness and weakness   Immunizations Administered    Name Date Dose VIS Date Route   Pfizer COVID-19 Vaccine 03/06/2020  9:21 AM 0.3 mL 11/25/2019 Intramuscular   Manufacturer: [place]   Lot: [id number]   [place]: [id number]

## 2020-03-13 DIAGNOSIS — M5416 Radiculopathy, lumbar region: Secondary | ICD-10-CM | POA: Diagnosis not present

## 2020-03-19 ENCOUNTER — Encounter: Payer: Self-pay | Admitting: Dietician

## 2020-03-19 ENCOUNTER — Other Ambulatory Visit: Payer: Self-pay

## 2020-03-19 ENCOUNTER — Encounter: Payer: Medicare Other | Attending: Family Medicine | Admitting: Dietician

## 2020-03-19 DIAGNOSIS — E119 Type 2 diabetes mellitus without complications: Secondary | ICD-10-CM | POA: Insufficient documentation

## 2020-03-19 NOTE — Progress Notes (Signed)
Medical Nutrition Therapy:  Appt start time: 0915 end time:  1020.   Assessment:  Primary concerns today:   Patient is here today with her husband.  They have made significant dietary changes.  159 lbs 03/19/2020 178 lbs 10/31/2019  14 lbs lost in the past month and 19 lbs overall Goal:  145 lbs-150 lbs  History includes:  New Type 2 Diabetes, fatty liver, crohn's in remission, osteoporosis, CKD stage 3, OSA without C-pap She has had recent steroid shots. Glucose 216 10/25/19.  A1C unknown.  Patient lives with her husband.  Her son and grandson are also living with her.  Her husband does the shopping and she does the cooking.  They have been following a diabetic diet for the past 5 weeks.  (Mostly lean meats, vegetables, fruit, greek yogurt.  With aim of 1200 calories per day).   Preferred Learning Style:   No preference indicated   Learning Readiness:   Ready  Change in progress   MEDICATIONS: Mertformin   DIETARY INTAKE:  Usual eating pattern includes 3 meals and 2-3 snacks per day.  24-hr recall:  B ( AM): egg whites OR [place] Yogurt, berries, 1/2 banana  Snk ( AM):  Occasional grapes L ( PM): ham sandwich, 2 tangerines Snk ( PM):  D ( PM): lean meat (pork chop or fish or [place]), vegetables, occasional side salad with low fat buttermilk ranch Snk ( PM): popcorn, nonfat milk Beverages: water, rare flavored water, 1 cup international coffee (non fat, sugar free  Usual physical activity: none currently due to back pain and will start back now that she is feeling better (resistance bands, walking with a video)  Progress Towards Goal(s):  In progress.   Nutritional Diagnosis:  NB-1.1 Food and nutrition-related knowledge deficit As related to balance of carbohydrate, protein, and fat.  As evidenced by newly diagnosed diabetes.    Intervention:  Nutrition counseling and diabetes education initiated. Discussed Carb Counting by food group as method of portion control,  reading food labels, and benefits of increased activity. Also discussed basic physiology of Diabetes, target BG ranges pre and post meals, and A1c.   Plan:  Aim for 2-3 Carb Choices per meal (30-45 grams) +/- 1 either way  Aim for 0-1 Carbs per snack if hungry  Include protein in moderation with your meals and snacks Consider reading food labels for Total Carbohydrate of foods Consider  increasing your activity level by resistance bands of walking for 30 minutes daily as tolerated Continue checking BG at alternate times per day  Continue taking medication as directed by MD   Teaching Method Utilized:  Visual Auditory Hands on  Handouts given during visit include: How to Thrive:  A Guide for Your Journey with Diabetes by the ADA Carb Counting and Food Label handouts Meal Plan Card  Barriers to learning/adherence to lifestyle change: none  Demonstrated degree of understanding via:  Teach Back   Monitoring/Evaluation:  Dietary intake, exercise, label reading, and body weight prn.

## 2020-03-19 NOTE — Patient Instructions (Signed)
Plan:  Aim for 2-3 Carb Choices per meal (30-45 grams) +/- 1 either way  Aim for 0-1 Carbs per snack if hungry  Include protein in moderation with your meals and snacks Consider reading food labels for Total Carbohydrate of foods Consider  increasing your activity level by resistance bands of walking for 30 minutes daily as tolerated Continue checking BG at alternate times per day  Continue taking medication as directed by MD

## 2020-04-13 DIAGNOSIS — H0016 Chalazion left eye, unspecified eyelid: Secondary | ICD-10-CM | POA: Diagnosis not present

## 2020-04-13 DIAGNOSIS — H401131 Primary open-angle glaucoma, bilateral, mild stage: Secondary | ICD-10-CM | POA: Diagnosis not present

## 2020-04-13 DIAGNOSIS — I10 Essential (primary) hypertension: Secondary | ICD-10-CM | POA: Diagnosis not present

## 2020-04-13 DIAGNOSIS — H04123 Dry eye syndrome of bilateral lacrimal glands: Secondary | ICD-10-CM | POA: Diagnosis not present

## 2020-04-17 DIAGNOSIS — E1351 Other specified diabetes mellitus with diabetic peripheral angiopathy without gangrene: Secondary | ICD-10-CM | POA: Diagnosis not present

## 2020-04-17 DIAGNOSIS — M21611 Bunion of right foot: Secondary | ICD-10-CM | POA: Diagnosis not present

## 2020-04-17 DIAGNOSIS — L84 Corns and callosities: Secondary | ICD-10-CM | POA: Diagnosis not present

## 2020-04-17 DIAGNOSIS — M2041 Other hammer toe(s) (acquired), right foot: Secondary | ICD-10-CM | POA: Diagnosis not present

## 2020-04-17 DIAGNOSIS — L602 Onychogryphosis: Secondary | ICD-10-CM | POA: Diagnosis not present

## 2020-04-17 DIAGNOSIS — M21612 Bunion of left foot: Secondary | ICD-10-CM | POA: Diagnosis not present

## 2020-04-20 ENCOUNTER — Ambulatory Visit
Admission: RE | Admit: 2020-04-20 | Discharge: 2020-04-20 | Disposition: A | Discharge status: Home / Self Care | Payer: Medicare Other | Source: Ambulatory Visit | Attending: Family Medicine | Admitting: Family Medicine

## 2020-04-20 ENCOUNTER — Other Ambulatory Visit: Payer: Self-pay | Admitting: Family Medicine

## 2020-04-20 DIAGNOSIS — I129 Hypertensive chronic kidney disease with stage 1 through stage 4 chronic kidney disease, or unspecified chronic kidney disease: Secondary | ICD-10-CM | POA: Diagnosis not present

## 2020-04-20 DIAGNOSIS — M25512 Pain in left shoulder: Secondary | ICD-10-CM

## 2020-04-20 DIAGNOSIS — E1140 Type 2 diabetes mellitus with diabetic neuropathy, unspecified: Secondary | ICD-10-CM | POA: Diagnosis not present

## 2020-04-25 DIAGNOSIS — H02112 Cicatricial ectropion of right lower eyelid: Secondary | ICD-10-CM | POA: Diagnosis not present

## 2020-04-25 DIAGNOSIS — H02115 Cicatricial ectropion of left lower eyelid: Secondary | ICD-10-CM | POA: Diagnosis not present

## 2020-04-25 DIAGNOSIS — H04522 Eversion of left lacrimal punctum: Secondary | ICD-10-CM | POA: Diagnosis not present

## 2020-04-25 DIAGNOSIS — H16213 Exposure keratoconjunctivitis, bilateral: Secondary | ICD-10-CM | POA: Diagnosis not present

## 2020-04-25 DIAGNOSIS — H02135 Senile ectropion of left lower eyelid: Secondary | ICD-10-CM | POA: Diagnosis not present

## 2020-04-25 DIAGNOSIS — H04521 Eversion of right lacrimal punctum: Secondary | ICD-10-CM | POA: Diagnosis not present

## 2020-04-25 DIAGNOSIS — H16211 Exposure keratoconjunctivitis, right eye: Secondary | ICD-10-CM | POA: Diagnosis not present

## 2020-04-25 DIAGNOSIS — H02132 Senile ectropion of right lower eyelid: Secondary | ICD-10-CM | POA: Diagnosis not present

## 2020-04-25 DIAGNOSIS — H04123 Dry eye syndrome of bilateral lacrimal glands: Secondary | ICD-10-CM | POA: Diagnosis not present

## 2020-04-25 DIAGNOSIS — H02142 Spastic ectropion of right lower eyelid: Secondary | ICD-10-CM | POA: Diagnosis not present

## 2020-04-25 DIAGNOSIS — H16212 Exposure keratoconjunctivitis, left eye: Secondary | ICD-10-CM | POA: Diagnosis not present

## 2020-04-25 DIAGNOSIS — H02145 Spastic ectropion of left lower eyelid: Secondary | ICD-10-CM | POA: Diagnosis not present

## 2020-05-04 DIAGNOSIS — M24812 Other specific joint derangements of left shoulder, not elsewhere classified: Secondary | ICD-10-CM | POA: Diagnosis not present

## 2020-05-04 DIAGNOSIS — M67912 Unspecified disorder of synovium and tendon, left shoulder: Secondary | ICD-10-CM | POA: Diagnosis not present

## 2020-05-07 ENCOUNTER — Encounter: Payer: Self-pay | Admitting: Cardiovascular Disease

## 2020-05-07 ENCOUNTER — Telehealth: Payer: Self-pay

## 2020-05-07 DIAGNOSIS — F17200 Nicotine dependence, unspecified, uncomplicated: Secondary | ICD-10-CM | POA: Diagnosis not present

## 2020-05-07 DIAGNOSIS — N183 Chronic kidney disease, stage 3 unspecified: Secondary | ICD-10-CM | POA: Diagnosis not present

## 2020-05-07 DIAGNOSIS — E1140 Type 2 diabetes mellitus with diabetic neuropathy, unspecified: Secondary | ICD-10-CM | POA: Diagnosis not present

## 2020-05-07 DIAGNOSIS — M5136 Other intervertebral disc degeneration, lumbar region: Secondary | ICD-10-CM | POA: Diagnosis not present

## 2020-05-07 DIAGNOSIS — E1169 Type 2 diabetes mellitus with other specified complication: Secondary | ICD-10-CM | POA: Diagnosis not present

## 2020-05-07 NOTE — Telephone Encounter (Signed)
   [facility] Pre-operative Risk Assessment    HEARTCARE STAFF: - Please ensure there is not already an duplicate clearance open for this procedure. - Under Visit Info/Reason for Call, type in Other and utilize the format Clearance MM/DD/YY or Clearance TBD. Do not use dashes or single digits. - If request is for dental extraction, please clarify the # of teeth to be extracted.  Request for surgical clearance:  1. What type of surgery is being performed? Bilateral lower lid ectropion repair, bilateral irrigation, bilateral lower lid punctoplasty, bilateral conjuctival plasty  2. When is this surgery scheduled? 05/23/20  3. What type of clearance is required (medical clearance vs. Pharmacy clearance to hold med vs. Both)? both  4. Are there any medications that need to be held prior to surgery and how long? aspirin  5. Practice name and name of physician performing surgery? [facility] / Dr. Sarajane Marek   6. What is the office phone number? [phone]   7.   What is the office fax number? [phone]  8.   Anesthesia type (None, local, MAC, general) ? unknown   Sherrie Mustache 05/07/2020, 5:12 PM  _________________________________________________________________   (provider comments below)

## 2020-05-08 NOTE — Telephone Encounter (Signed)
LM2CB-pt needs appt for cardiac clearance

## 2020-05-08 NOTE — Telephone Encounter (Signed)
Primary Ana Hamilton, MD  Chart reviewed as part of pre-operative protocol coverage. Because of [name] past medical history and time since last visit, he/she will require a follow-up visit in order to better assess preoperative cardiovascular risk.  Pre-op covering staff: - Please schedule appointment and call patient to inform them. - Please contact requesting surgeon's office via preferred method (i.e, phone, fax) to inform them of need for appointment prior to surgery.  If applicable, this message will also be routed to pharmacy pool and/or primary cardiologist for input on holding anticoagulant/antiplatelet agent as requested below so that this information is available at time of patient's appointment.   Deberah Pelton, NP  05/08/2020, 1:43 PM

## 2020-05-08 NOTE — Telephone Encounter (Signed)
Returned call to pt she states that she was informed by her surgeons office yesterday that she was cleared for surgery already, she states that she is confused. I tried to inform pt to go ahead and schedule appt for cardiac clearance with Ryan on 5-28 so she can have her scheduled surgery. Before I could schedule appt pt had hung up. Will await CB from pt to reschedule appt.

## 2020-05-08 NOTE — Telephone Encounter (Signed)
Pt's calling to schedule her pre op appt. I was unable to find availability within her provider's team. Will round to pre op call back pool for follow up. Pt's procedure in 6/9.

## 2020-05-09 NOTE — Progress Notes (Addendum)
Cardiology Office Note:    Date:  05/16/2020   ID:  [name], DOB 05-11-1941, MRN [id number]  PCP:  Harlan Stains, MD  Cardiologist:  Skeet Latch, MD  Electrophysiologist:  None   Referring MD: Harlan Stains, MD   Chief Complaint: pre-op evaluation  History of Present Illness:    [name] is a 79 y.o. 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 Oval Linsey and presents today for pre-op evaluation.  Patient initially seen in 11/2017 after CT scan for lung cancer screening in 10/2017 incidentally showed three vessel coronary calcifications and a 4.1 cm ascending aortic aneurysm. At that time, she denied any cardiac symptoms so no stress test was pursed. She later had one episode of atypical chest pain at rest. She was referred for Exercise Myoview in 02/2018 which showed no evidence of ischemia. She was able to achieve 7 METS via Bruce protocol. 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 However, she also admitted to dyspnea on exertion. Her husband reported that she could not walk without given out. These was markedly different to her prior visit in 05/2018 when she reported riding an exercise bike for 30 minutes most days of the week. Echo was ordered for further evaluation and showed LVEF of 60-65% with normal GLS.  Patient presents today for pre-op evaluation for upcoming eye surgery. Patient doing well from a cardiac standpoint. She has some chronic shortness of breath with her COPD but states that this is stable. No chest pain. No orthopnea, PND, lower extremity edema. No palpitations, lightheadedness, dizziness, or syncope. She has good functional status and is able to  climb a flight of steps and complete house hold chores without any problems.  Past Medical History:  Diagnosis Date  . 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  . Arthritis   . Asymptomatic coronary heart disease 11/20/2017  . Cancer [facility]) 2004   bladder   . COPD (chronic obstructive pulmonary disease) ([place])   . Crohn's disease ([place])   . Diabetes mellitus without complication ([place])   . Essential hypertension 11/20/2017  . Gait difficulty   . OAB (overactive bladder)   . Smoker   . Tobacco abuse 11/20/2017  . Vertigo     Past Surgical History:  Procedure Laterality Date  . BLADDER SURGERY    . CHOLECYSTECTOMY    . FOOT ARTHRODESIS Left 07/02/2017   Procedure: Left Subtalar Arthrodesis;  Surgeon: Wylene Simmer, MD;  Location: [place];  Service: Orthopedics;  Laterality: Left;  . FRACTURE SURGERY    . HARDWARE REMOVAL Left 04/24/2016   Procedure: REMOVAL OF DEEP IMPLANT LEFT FOOT;  Surgeon: Wylene Simmer, MD;  Location: [place];  Service: Orthopedics;  Laterality: Left;  ANESTHESIA:  REGIONAL/MONITOR ANESTHESIA  . HARDWARE REMOVAL Left 07/02/2017   Procedure: Removal of Deep Implants;  Surgeon: Wylene Simmer, MD;  Location: [place];  Service: Orthopedics;  Laterality: Left;  . JOINT REPLACEMENT     knee  . throat polyps    . TUBAL LIGATION      Current Medications: No outpatient medications have been marked as taking for  the 05/16/20 encounter (Office Visit) with Darreld Mclean, PA-C.     Allergies:   Rosuvastatin   Social History   Socioeconomic History  . Marital status: Married    Spouse name: Herbie Baltimore  . Number of children: 2  . Years of education: Not on file  . Highest education level: GED or equivalent  Occupational History  . Not on file  Tobacco Use  . Smoking status: Former Smoker    Packs/day: 1.00    Years: 40.00    Pack years: 40.00    Types: Cigarettes, E-cigarettes  .  Smokeless tobacco: Never Used  . Tobacco comment: currently trying to quit 06-26-17  Substance and Sexual Activity  . Alcohol use: No    Alcohol/week: 0.0 standard drinks  . Drug use: No  . Sexual activity: Not on file  Other Topics Concern  . Not on file  Social History Narrative   Lives with husband, 2 children   Caffeine- coffee 1 cup daily   Social Determinants of Health   Financial Resource Strain:   . Difficulty of Paying Living Expenses:   Food Insecurity:   . Worried About Charity fundraiser in the Last Year:   . Arboriculturist in the Last Year:   Transportation Needs:   . Film/video editor (Medical):   [name] Lack of Transportation (Non-Medical):   Physical Activity:   . Days of Exercise per Week:   . Minutes of Exercise per Session:   Stress:   . Feeling of Stress :   Social Connections:   . Frequency of Communication with Friends and Family:   . Frequency of Social Gatherings with Friends and Family:   . Attends Religious Services:   . Active Member of Clubs or Organizations:   . Attends Archivist Meetings:   [name] Marital Status:      Family History: The patient's family history includes Cancer in her mother; Stroke in her sister.  ROS:   Please see the history of present illness.    All other systems reviewed and are negative.  EKGs/Labs/Other Studies Reviewed:    The following studies were reviewed today:  Exercise Myoview 02/17/2018:  Nuclear stress EF: 58%.  The left ventricular ejection fraction is normal (55-65%).  Blood pressure demonstrated a normal response to exercise.  There was no ST segment deviation noted during stress.  No T wave inversion was noted during stress.  This is a low risk study.   No reversible ischemia. LVEF 58% with normal wall motion. This is a low risk study. _______________  Echocardiogram 09/29/2019: Impressions: 1. Left ventricular ejection fraction, by visual estimation, is 60 to  65%. The left  ventricle has normal function. Normal left ventricular size.  There is no left ventricular hypertrophy.  2. Normal GLS.  3. Global right ventricle has normal systolic function.The right  ventricular size is normal. No increase in right ventricular wall  thickness.  4. Left atrial size was normal.  5. Right atrial size was normal.  6. The mitral valve is normal in structure. Trace mitral valve  regurgitation. No evidence of mitral stenosis.  7. The tricuspid valve is normal in structure. Tricuspid valve  regurgitation was not visualized by color flow Doppler.  8. The aortic valve is normal in structure. Aortic valve regurgitation  was not visualized by color flow Doppler. Structurally normal aortic  valve, with no evidence of sclerosis or stenosis.  9. The pulmonic valve was normal in structure. Pulmonic  valve  regurgitation is not visualized by color flow Doppler.  10. Normal pulmonary artery systolic pressure.  11. The inferior vena cava is normal in size with greater than 50%  respiratory variability, suggesting right atrial pressure of 3 mmHg.  EKG:  EKG ordered today. EKG personally reviewed and demonstrates normal sinus rhythm, rate 74 bpm, with new RBBB and known LAFB. Non-specific ST/T changes.   Recent Labs: No results found for requested labs within last 8760 hours.  Recent Lipid Panel    Component Value Date/Time   CHOL 134 11/26/2017 0920   TRIG 168 (H) 11/26/2017 0920   HDL 37 (L) 11/26/2017 0920   CHOLHDL 3.6 11/26/2017 0920   LDLCALC 63 11/26/2017 0920    Physical Exam:    Vital Signs: There were no vitals taken for this visit.    Wt Readings from Last 3 Encounters:  03/19/20 159 lb (72.1 kg)  09/20/19 179 lb 12.8 oz (81.6 kg)  09/05/19 179 lb 12.8 oz (81.6 kg)     General: 79 y.o. female in no acute distress. HEENT: Normocephalic and atraumatic. Sclera clear.  Neck: Supple. No carotid bruits. No JVD. Heart: RRR. Distinct S1 and S2. No murmurs,  gallops, or rubs. Radial pulses 2+ and equal bilaterally. Lungs: No increased work of breathing. Clear to ausculation bilaterally. No wheezes, rhonchi, or rales.  Abdomen: Soft, non-distended, and non-tender to palpation.  Extremities: No lower extremity edema.    Skin: Warm and dry. Neuro: Alert and oriented x3. No focal deficits. Psych: Normal affect. Responds appropriately.   Assessment:    1. Pre-op evaluation   2. Coronary artery calcification   3. Ascending aortic aneurysm ([place])   4. Essential hypertension   5. Dyslipidemia   6. Tobacco abuse     Plan:    Pre-Op Evaluation  - Patient has upcoming eye surgery planned for 05/23/2020.  - Per Revised Cardiac Risk Index, considered very low risk. Able to complete >4.0 METS without any problems. Therefore, she would be at acceptable risk for the planned procedure without further cardiovascular testing. OK to hold Aspirin for 5-7 day prior to procedure but this should be resumed as soon as possible in post-op period. I will route this recommendation to the requesting party via Epic fax function.  Coronary Artery Calcifications - Incidentally found on CT scan in 2018. Myoview in 02/2018 was low risk.  - EKG shows normal sinus rhythm with new RBBB. No symptoms with this. Suspect due to COPD. - Continue Aspirin. Starting statin.  Ascending Aorta Aneurysm  - Found to have 4.1cm ascending aortic aneurysm on CT scan in 2018. No evidence of dilatation of aortic root, ascending aorta, or aortic root were noted on Echo in 09/2019.  - Patient was scheduled to have repeat CT in 11/2019 for lung cancer screening but this had to be cancelled. Advised patient to call Pulmonology to get this rescheduled. Aneurysm can be rechecked at that time. May then need CTA or MRA for further evaluation.  Hypertension - BP 136/80 today. - Patient previously on Lisinopril but patient states PCP recently stopped this due to low BP. Therefore, currently not on any  antihypertensive. - BP goal <130/80. Asked patient to continue to monitor BP at home and let us know if consistently above goal.  Dyslipidemia - Lipid panel from 10/2019 (from [facility]): Total Cholesterol 153, Triglycerides 158, HDL 43, LDL 79.  - ASCVD 10 year risk greater than >7.5%. Given diabetes and coronary calcification on CT scan, high-intensity statin  recommended. Will start Lipitor 40mg  daily.  - Repeat lipid panel and LFTs in 2 months.   Tobacco Abuse - Patient quit smoking cigarettes about 1 year ago but continues to smoke E-cigarettes. She is trying to stop this as well. Congratulated patient on profess so far and encouraged her to continue to work on quitting E-cigarettes.   Disposition: Follow up in 6 months with Dr. Oval Linsey.    Medication Adjustments/Labs and Tests Ordered: Current medicines are reviewed at length with the patient today.  Concerns regarding medicines are outlined above.  Orders Placed This Encounter  Procedures  . Lipid panel  . Hepatic function panel  . EKG 12-Lead   Meds ordered this encounter  Medications  . atorvastatin (LIPITOR) 40 MG tablet    Sig: Take 1 tablet (40 mg total) by mouth daily.    Dispense:  90 tablet    Refill:  1    Patient Instructions  Medication Instructions:   START Atorvastatin 40 mg daily.  *If you need a refill on your cardiac medications before your next appointment, please call your pharmacy*   Lab Work: Your physician recommends that you return for a FASTING lipid profile and hepatic function panel in 2 months (August).  If you have labs (blood work) drawn today and your tests are completely normal, you will receive your results only by: [name] MyChart Message (if you have MyChart) OR . A paper copy in the mail If you have any lab test that is abnormal or we need to change your treatment, we will call you to review the results.   Follow-Up: At [facility], you and your health needs are our priority.  As part of  our continuing mission to provide you with exceptional heart care, we have created designated Provider Care Teams.  These Care Teams include your primary Cardiologist (physician) and Advanced Practice Providers (APPs -  Physician Assistants and Nurse Practitioners) who all work together to provide you with the care you need, when you need it.  We recommend signing up for the patient portal called "MyChart".  Sign up information is provided on this After Visit Summary.  MyChart is used to connect with patients for Virtual Visits (Telemedicine).  Patients are able to view lab/test results, encounter notes, upcoming appointments, etc.  Non-urgent messages can be sent to your provider as well.   To learn more about what you can do with MyChart, go to [email or web address].    Your next appointment:   6 month(s)  The format for your next appointment:   In Person  Provider:   Skeet Latch, MD   Other Instructions Please call our office 2 months in advance to schedule your follow-up appointment with Dr. Oval Linsey.  You have been cleared for your procedure. OK to HOLD your Aspirin for 5-7 days prior to procedure.  [facility]: [phone]     Signed, Darreld Mclean, PA-C  05/16/2020 3:14 PM    [facility]

## 2020-05-10 NOTE — Telephone Encounter (Signed)
APPOINTMENT SCHEDULED FOR CARDIAC CLEARANCE 6-2 W/CALLIE

## 2020-05-16 ENCOUNTER — Other Ambulatory Visit: Payer: Self-pay

## 2020-05-16 ENCOUNTER — Encounter: Payer: Self-pay | Admitting: Student

## 2020-05-16 ENCOUNTER — Ambulatory Visit (INDEPENDENT_AMBULATORY_CARE_PROVIDER_SITE_OTHER): Payer: Medicare Other | Admitting: Student

## 2020-05-16 VITALS — BP 136/80 | HR 74 | Ht 63.0 in | Wt 153.4 lb

## 2020-05-16 DIAGNOSIS — Z01818 Encounter for other preprocedural examination: Secondary | ICD-10-CM

## 2020-05-16 DIAGNOSIS — I2584 Coronary atherosclerosis due to calcified coronary lesion: Secondary | ICD-10-CM

## 2020-05-16 DIAGNOSIS — I2510 Atherosclerotic heart disease of native coronary artery without angina pectoris: Secondary | ICD-10-CM

## 2020-05-16 DIAGNOSIS — Z720 Tobacco use: Secondary | ICD-10-CM

## 2020-05-16 DIAGNOSIS — E785 Hyperlipidemia, unspecified: Secondary | ICD-10-CM

## 2020-05-16 DIAGNOSIS — I10 Essential (primary) hypertension: Secondary | ICD-10-CM | POA: Diagnosis not present

## 2020-05-16 DIAGNOSIS — I712 Thoracic aortic aneurysm, without rupture: Secondary | ICD-10-CM

## 2020-05-16 DIAGNOSIS — I7121 Aneurysm of the ascending aorta, without rupture: Secondary | ICD-10-CM

## 2020-05-16 MED ORDER — ATORVASTATIN CALCIUM 40 MG PO TABS: 40.0000 mg | ORAL_TABLET | Freq: Every day | ORAL | 1 refills | Status: DC

## 2020-05-16 NOTE — Patient Instructions (Signed)
Medication Instructions:   START Atorvastatin 40 mg daily.  *If you need a refill on your cardiac medications before your next appointment, please call your pharmacy*   Lab Work: Your physician recommends that you return for a FASTING lipid profile and hepatic function panel in 2 months (August).  If you have labs (blood work) drawn today and your tests are completely normal, you will receive your results only by: [name] MyChart Message (if you have MyChart) OR . A paper copy in the mail If you have any lab test that is abnormal or we need to change your treatment, we will call you to review the results.   Follow-Up: At [facility], you and your health needs are our priority.  As part of our continuing mission to provide you with exceptional heart care, we have created designated Provider Care Teams.  These Care Teams include your primary Cardiologist (physician) and Advanced Practice Providers (APPs -  Physician Assistants and Nurse Practitioners) who all work together to provide you with the care you need, when you need it.  We recommend signing up for the patient portal called "MyChart".  Sign up information is provided on this After Visit Summary.  MyChart is used to connect with patients for Virtual Visits (Telemedicine).  Patients are able to view lab/test results, encounter notes, upcoming appointments, etc.  Non-urgent messages can be sent to your provider as well.   To learn more about what you can do with MyChart, go to [email or web address].    Your next appointment:   6 month(s)  The format for your next appointment:   In Person  Provider:   Skeet Latch, MD   Other Instructions Please call our office 2 months in advance to schedule your follow-up appointment with Dr. Oval Linsey.  You have been cleared for your procedure. OK to HOLD your Aspirin for 5-7 days prior to procedure.  [facility]: [phone]

## 2020-05-23 ENCOUNTER — Other Ambulatory Visit: Payer: Self-pay

## 2020-05-23 DIAGNOSIS — H10401 Unspecified chronic conjunctivitis, right eye: Secondary | ICD-10-CM | POA: Diagnosis not present

## 2020-05-23 DIAGNOSIS — H10402 Unspecified chronic conjunctivitis, left eye: Secondary | ICD-10-CM | POA: Diagnosis not present

## 2020-05-23 DIAGNOSIS — H02142 Spastic ectropion of right lower eyelid: Secondary | ICD-10-CM | POA: Diagnosis not present

## 2020-05-23 DIAGNOSIS — H02145 Spastic ectropion of left lower eyelid: Secondary | ICD-10-CM | POA: Diagnosis not present

## 2020-05-23 DIAGNOSIS — H04523 Eversion of bilateral lacrimal punctum: Secondary | ICD-10-CM | POA: Diagnosis not present

## 2020-05-23 DIAGNOSIS — H16212 Exposure keratoconjunctivitis, left eye: Secondary | ICD-10-CM | POA: Diagnosis not present

## 2020-05-23 DIAGNOSIS — H1031 Unspecified acute conjunctivitis, right eye: Secondary | ICD-10-CM | POA: Diagnosis not present

## 2020-05-23 DIAGNOSIS — H04563 Stenosis of bilateral lacrimal punctum: Secondary | ICD-10-CM | POA: Diagnosis not present

## 2020-05-23 DIAGNOSIS — H02132 Senile ectropion of right lower eyelid: Secondary | ICD-10-CM | POA: Diagnosis not present

## 2020-05-23 DIAGNOSIS — H16213 Exposure keratoconjunctivitis, bilateral: Secondary | ICD-10-CM | POA: Diagnosis not present

## 2020-05-23 DIAGNOSIS — H04123 Dry eye syndrome of bilateral lacrimal glands: Secondary | ICD-10-CM | POA: Diagnosis not present

## 2020-05-23 DIAGNOSIS — H1032 Unspecified acute conjunctivitis, left eye: Secondary | ICD-10-CM | POA: Diagnosis not present

## 2020-05-23 DIAGNOSIS — H02135 Senile ectropion of left lower eyelid: Secondary | ICD-10-CM | POA: Diagnosis not present

## 2020-05-23 DIAGNOSIS — H04223 Epiphora due to insufficient drainage, bilateral lacrimal glands: Secondary | ICD-10-CM | POA: Diagnosis not present

## 2020-05-23 DIAGNOSIS — H16211 Exposure keratoconjunctivitis, right eye: Secondary | ICD-10-CM | POA: Diagnosis not present

## 2020-05-23 DIAGNOSIS — H02115 Cicatricial ectropion of left lower eyelid: Secondary | ICD-10-CM | POA: Diagnosis not present

## 2020-05-23 DIAGNOSIS — H02112 Cicatricial ectropion of right lower eyelid: Secondary | ICD-10-CM | POA: Diagnosis not present

## 2020-06-15 ENCOUNTER — Ambulatory Visit
Admission: RE | Admit: 2020-06-15 | Discharge: 2020-06-15 | Disposition: A | Discharge status: Home / Self Care | Payer: Medicare Other | Source: Ambulatory Visit | Attending: Acute Care | Admitting: Acute Care

## 2020-06-15 DIAGNOSIS — F17210 Nicotine dependence, cigarettes, uncomplicated: Secondary | ICD-10-CM

## 2020-06-15 DIAGNOSIS — Z87891 Personal history of nicotine dependence: Secondary | ICD-10-CM | POA: Diagnosis not present

## 2020-06-15 DIAGNOSIS — Z122 Encounter for screening for malignant neoplasm of respiratory organs: Secondary | ICD-10-CM

## 2020-06-25 ENCOUNTER — Telehealth: Payer: Self-pay | Admitting: Acute Care

## 2020-06-25 DIAGNOSIS — Z87891 Personal history of nicotine dependence: Secondary | ICD-10-CM

## 2020-06-25 DIAGNOSIS — F17210 Nicotine dependence, cigarettes, uncomplicated: Secondary | ICD-10-CM

## 2020-06-26 NOTE — Telephone Encounter (Signed)
LMTC x 1  

## 2020-06-28 ENCOUNTER — Encounter: Payer: Self-pay | Admitting: *Deleted

## 2020-06-28 NOTE — Telephone Encounter (Signed)
Letter mailed to pt with  CT results per Sarah Groce, NP.    Copy sent to PCP.  Order placed for 1 yr f/u CT. ° °

## 2020-06-28 NOTE — Telephone Encounter (Signed)
LMTC x 1  

## 2020-07-03 DIAGNOSIS — M21612 Bunion of left foot: Secondary | ICD-10-CM | POA: Diagnosis not present

## 2020-07-03 DIAGNOSIS — L84 Corns and callosities: Secondary | ICD-10-CM | POA: Diagnosis not present

## 2020-07-03 DIAGNOSIS — E1351 Other specified diabetes mellitus with diabetic peripheral angiopathy without gangrene: Secondary | ICD-10-CM | POA: Diagnosis not present

## 2020-07-03 DIAGNOSIS — M21611 Bunion of right foot: Secondary | ICD-10-CM | POA: Diagnosis not present

## 2020-07-03 DIAGNOSIS — M2042 Other hammer toe(s) (acquired), left foot: Secondary | ICD-10-CM | POA: Diagnosis not present

## 2020-07-03 DIAGNOSIS — M2041 Other hammer toe(s) (acquired), right foot: Secondary | ICD-10-CM | POA: Diagnosis not present

## 2020-07-03 DIAGNOSIS — L602 Onychogryphosis: Secondary | ICD-10-CM | POA: Diagnosis not present

## 2020-07-04 ENCOUNTER — Telehealth: Payer: Self-pay | Admitting: Acute Care

## 2020-07-06 DIAGNOSIS — M2041 Other hammer toe(s) (acquired), right foot: Secondary | ICD-10-CM | POA: Diagnosis not present

## 2020-07-06 DIAGNOSIS — M79674 Pain in right toe(s): Secondary | ICD-10-CM | POA: Diagnosis not present

## 2020-07-06 DIAGNOSIS — E1351 Other specified diabetes mellitus with diabetic peripheral angiopathy without gangrene: Secondary | ICD-10-CM | POA: Diagnosis not present

## 2020-07-06 DIAGNOSIS — I739 Peripheral vascular disease, unspecified: Secondary | ICD-10-CM | POA: Diagnosis not present

## 2020-07-06 DIAGNOSIS — M2042 Other hammer toe(s) (acquired), left foot: Secondary | ICD-10-CM | POA: Diagnosis not present

## 2020-07-06 NOTE — Telephone Encounter (Signed)
Called and left message for patient to return call.  

## 2020-07-09 NOTE — Telephone Encounter (Signed)
LMTC x 1  

## 2020-07-10 DIAGNOSIS — M533 Sacrococcygeal disorders, not elsewhere classified: Secondary | ICD-10-CM | POA: Diagnosis not present

## 2020-07-10 NOTE — Telephone Encounter (Signed)
Pt called back about this. Please return call.

## 2020-07-11 NOTE — Telephone Encounter (Signed)
Spoke with pt. She verified that she received the letter with her CT results. She verbalized understanding of the results and had no further questions. Nothing further needed at this time.

## 2020-07-11 NOTE — Telephone Encounter (Signed)
LMTC x 1  

## 2020-08-02 DIAGNOSIS — N816 Rectocele: Secondary | ICD-10-CM | POA: Diagnosis not present

## 2020-08-02 DIAGNOSIS — N3941 Urge incontinence: Secondary | ICD-10-CM | POA: Diagnosis not present

## 2020-08-02 DIAGNOSIS — C670 Malignant neoplasm of trigone of bladder: Secondary | ICD-10-CM | POA: Diagnosis not present

## 2020-08-02 DIAGNOSIS — R81 Glycosuria: Secondary | ICD-10-CM | POA: Diagnosis not present

## 2020-08-09 DIAGNOSIS — E1169 Type 2 diabetes mellitus with other specified complication: Secondary | ICD-10-CM | POA: Diagnosis not present

## 2020-08-09 DIAGNOSIS — E785 Hyperlipidemia, unspecified: Secondary | ICD-10-CM | POA: Diagnosis not present

## 2020-08-09 DIAGNOSIS — M5136 Other intervertebral disc degeneration, lumbar region: Secondary | ICD-10-CM | POA: Diagnosis not present

## 2020-08-09 DIAGNOSIS — E1140 Type 2 diabetes mellitus with diabetic neuropathy, unspecified: Secondary | ICD-10-CM | POA: Diagnosis not present

## 2020-08-09 DIAGNOSIS — N183 Chronic kidney disease, stage 3 unspecified: Secondary | ICD-10-CM | POA: Diagnosis not present

## 2020-08-09 DIAGNOSIS — I129 Hypertensive chronic kidney disease with stage 1 through stage 4 chronic kidney disease, or unspecified chronic kidney disease: Secondary | ICD-10-CM | POA: Diagnosis not present

## 2020-08-15 DIAGNOSIS — N3000 Acute cystitis without hematuria: Secondary | ICD-10-CM | POA: Diagnosis not present

## 2020-08-23 ENCOUNTER — Other Ambulatory Visit: Payer: Self-pay | Admitting: Family Medicine

## 2020-08-23 DIAGNOSIS — Z1231 Encounter for screening mammogram for malignant neoplasm of breast: Secondary | ICD-10-CM

## 2020-08-24 DIAGNOSIS — I10 Essential (primary) hypertension: Secondary | ICD-10-CM | POA: Diagnosis not present

## 2020-08-24 DIAGNOSIS — E7849 Other hyperlipidemia: Secondary | ICD-10-CM | POA: Diagnosis not present

## 2020-08-24 DIAGNOSIS — Z8249 Family history of ischemic heart disease and other diseases of the circulatory system: Secondary | ICD-10-CM | POA: Diagnosis not present

## 2020-08-24 DIAGNOSIS — Z1379 Encounter for other screening for genetic and chromosomal anomalies: Secondary | ICD-10-CM | POA: Diagnosis not present

## 2020-09-19 DIAGNOSIS — H02135 Senile ectropion of left lower eyelid: Secondary | ICD-10-CM | POA: Diagnosis not present

## 2020-09-19 DIAGNOSIS — H019 Unspecified inflammation of eyelid: Secondary | ICD-10-CM | POA: Diagnosis not present

## 2020-09-19 DIAGNOSIS — H02132 Senile ectropion of right lower eyelid: Secondary | ICD-10-CM | POA: Diagnosis not present

## 2020-09-20 DIAGNOSIS — E1351 Other specified diabetes mellitus with diabetic peripheral angiopathy without gangrene: Secondary | ICD-10-CM | POA: Diagnosis not present

## 2020-09-20 DIAGNOSIS — L84 Corns and callosities: Secondary | ICD-10-CM | POA: Diagnosis not present

## 2020-09-20 DIAGNOSIS — L602 Onychogryphosis: Secondary | ICD-10-CM | POA: Diagnosis not present

## 2020-09-20 DIAGNOSIS — M21611 Bunion of right foot: Secondary | ICD-10-CM | POA: Diagnosis not present

## 2020-09-20 DIAGNOSIS — M21612 Bunion of left foot: Secondary | ICD-10-CM | POA: Diagnosis not present

## 2020-09-20 DIAGNOSIS — L03031 Cellulitis of right toe: Secondary | ICD-10-CM | POA: Diagnosis not present

## 2020-09-25 ENCOUNTER — Ambulatory Visit
Admission: RE | Admit: 2020-09-25 | Discharge: 2020-09-25 | Disposition: A | Discharge status: Home / Self Care | Payer: Medicare Other | Source: Ambulatory Visit | Attending: Family Medicine | Admitting: Family Medicine

## 2020-09-25 ENCOUNTER — Other Ambulatory Visit: Payer: Self-pay

## 2020-09-25 DIAGNOSIS — Z1231 Encounter for screening mammogram for malignant neoplasm of breast: Secondary | ICD-10-CM

## 2020-10-01 DIAGNOSIS — M2041 Other hammer toe(s) (acquired), right foot: Secondary | ICD-10-CM | POA: Diagnosis not present

## 2020-10-01 DIAGNOSIS — L03031 Cellulitis of right toe: Secondary | ICD-10-CM | POA: Diagnosis not present

## 2020-10-01 DIAGNOSIS — E1351 Other specified diabetes mellitus with diabetic peripheral angiopathy without gangrene: Secondary | ICD-10-CM | POA: Diagnosis not present

## 2020-10-01 DIAGNOSIS — L309 Dermatitis, unspecified: Secondary | ICD-10-CM | POA: Diagnosis not present

## 2020-10-30 DIAGNOSIS — N183 Chronic kidney disease, stage 3 unspecified: Secondary | ICD-10-CM | POA: Diagnosis not present

## 2020-10-30 DIAGNOSIS — E785 Hyperlipidemia, unspecified: Secondary | ICD-10-CM | POA: Diagnosis not present

## 2020-10-30 DIAGNOSIS — E1140 Type 2 diabetes mellitus with diabetic neuropathy, unspecified: Secondary | ICD-10-CM | POA: Diagnosis not present

## 2020-10-30 DIAGNOSIS — I2510 Atherosclerotic heart disease of native coronary artery without angina pectoris: Secondary | ICD-10-CM | POA: Diagnosis not present

## 2020-10-30 DIAGNOSIS — I700 Atherosclerosis of aorta: Secondary | ICD-10-CM | POA: Diagnosis not present

## 2020-10-30 DIAGNOSIS — Z23 Encounter for immunization: Secondary | ICD-10-CM | POA: Diagnosis not present

## 2020-10-30 DIAGNOSIS — I129 Hypertensive chronic kidney disease with stage 1 through stage 4 chronic kidney disease, or unspecified chronic kidney disease: Secondary | ICD-10-CM | POA: Diagnosis not present

## 2020-10-30 DIAGNOSIS — M5136 Other intervertebral disc degeneration, lumbar region: Secondary | ICD-10-CM | POA: Diagnosis not present

## 2020-10-30 DIAGNOSIS — M25511 Pain in right shoulder: Secondary | ICD-10-CM | POA: Diagnosis not present

## 2020-10-30 DIAGNOSIS — J449 Chronic obstructive pulmonary disease, unspecified: Secondary | ICD-10-CM | POA: Diagnosis not present

## 2020-10-30 DIAGNOSIS — Z0000 Encounter for general adult medical examination without abnormal findings: Secondary | ICD-10-CM | POA: Diagnosis not present

## 2020-10-30 DIAGNOSIS — F419 Anxiety disorder, unspecified: Secondary | ICD-10-CM | POA: Diagnosis not present

## 2020-11-15 ENCOUNTER — Ambulatory Visit (INDEPENDENT_AMBULATORY_CARE_PROVIDER_SITE_OTHER): Payer: Medicare Other | Admitting: Cardiovascular Disease

## 2020-11-15 ENCOUNTER — Other Ambulatory Visit: Payer: Self-pay

## 2020-11-15 ENCOUNTER — Encounter: Payer: Self-pay | Admitting: Cardiovascular Disease

## 2020-11-15 VITALS — BP 130/76 | HR 73 | Temp 97.5°F | Ht 63.0 in | Wt 151.8 lb

## 2020-11-15 DIAGNOSIS — I2584 Coronary atherosclerosis due to calcified coronary lesion: Secondary | ICD-10-CM

## 2020-11-15 DIAGNOSIS — I712 Thoracic aortic aneurysm, without rupture: Secondary | ICD-10-CM | POA: Diagnosis not present

## 2020-11-15 DIAGNOSIS — I10 Essential (primary) hypertension: Secondary | ICD-10-CM

## 2020-11-15 DIAGNOSIS — I2510 Atherosclerotic heart disease of native coronary artery without angina pectoris: Secondary | ICD-10-CM | POA: Diagnosis not present

## 2020-11-15 DIAGNOSIS — I7121 Aneurysm of the ascending aorta, without rupture: Secondary | ICD-10-CM

## 2020-11-15 NOTE — Progress Notes (Signed)
Cardiology Office Note   Date:  11/15/2020   ID:  Sumi Lye, DOB [birth year], MRN [id number]  PCP:  Harlan Stains, MD  Cardiologist:   Skeet Latch, MD   No chief complaint on file.    History of Present Illness: [name] is a 79 y.o. female with coronary artery calcification, diabetes, hypertension, mild ascending aortic aneurysm, Crohn's disease in remission, bladder cancer in remission, COPD, and prior tobacco abuse here for follow up.  She was initially seen 11/2017 for chest pain.  She was initially seen evaluation of coronary calcifications noted on CT scan.  [name] had a lung cancer screening CT on 10/2017.  She was noted to have three-vessel CAD and a 4.1 cm ascending aortic aneurysm.  At the time she was feeling well so no stress testing was pursued.  She then had an episode of chest pain that occurred when lying in bed.  She was referred for an exercise Myoview 02/2018 that revealed LVEF 58% with no  ischemia.  She achieved 7 METS on a Bruce protocol.    [name] was diagnosed with diabetes 5 months ago.  Since then she has been working on her diet.  She was started on Metformin and her glucose has been well-controlled.  She saw Sande Rives, PA-C on 05/2020 for preoperative clearance prior to an eye surgery.  She was started on atorvastatin due to her atherosclerosis and has tolerated this well.  Lipids have been well-controlled.  She is very active doing her ADLs and housework but denies any formal exercise.  She takes care of her grandchildren.  She is unable to get formal exercise due to chronic hip and shoulder pain.  She has no exertional chest pain or shortness of breath.  She denies lower extremity edema, orthopnea, or PND.  She quit smoking 2 years ago and is still using a vape but hopes to quit that too.  Past Medical History:  Diagnosis Date  . 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  . Arthritis   . Asymptomatic coronary  heart disease 11/20/2017  . Cancer [facility]) 2004   bladder   . COPD (chronic obstructive pulmonary disease) ([place])   . Crohn's disease ([place])   . Diabetes mellitus without complication ([place])   . Essential hypertension 11/20/2017  . Gait difficulty   . OAB (overactive bladder)   . Smoker   . Tobacco abuse 11/20/2017  . Vertigo     Past Surgical History:  Procedure Laterality Date  . BLADDER SURGERY    . CHOLECYSTECTOMY    . FOOT ARTHRODESIS Left 07/02/2017   Procedure: Left Subtalar Arthrodesis;  Surgeon: Wylene Simmer, MD;  Location: [place];  Service: Orthopedics;  Laterality: Left;  . FRACTURE SURGERY    . HARDWARE REMOVAL Left 04/24/2016   Procedure: REMOVAL OF DEEP IMPLANT LEFT FOOT;  Surgeon: Wylene Simmer, MD;  Location: [place];  Service: Orthopedics;  Laterality: Left;  ANESTHESIA:  REGIONAL/MONITOR ANESTHESIA  . HARDWARE REMOVAL Left 07/02/2017   Procedure: Removal of Deep Implants;  Surgeon: Wylene Simmer, MD;  Location: [place];  Service: Orthopedics;  Laterality: Left;  . JOINT REPLACEMENT     knee  . throat polyps    . TUBAL LIGATION       Current Outpatient Medications  Medication Sig Dispense Refill  . aspirin EC 81 MG tablet Take 81 mg by mouth daily.    . calcium-vitamin D (OSCAL  WITH D) 500-200 MG-UNIT tablet Take 1 tablet by mouth.    . folic acid (FOLVITE) 160 MCG tablet Take 400 mcg by mouth daily.    [name] lidocaine (LIDODERM) 5 % Place 1 patch onto the skin daily. Remove & Discard patch within 12 hours or as directed by MD 30 patch 0  . metFORMIN (GLUCOPHAGE) 500 MG tablet Take by mouth 2 (two) times daily with a meal.    . mirabegron ER (MYRBETRIQ) 50 MG TB24 tablet Take 50 mg by mouth daily.    . Multiple Vitamins-Minerals (CENTRUM ULTRA WOMENS PO) Take by mouth.    . umeclidinium-vilanterol (ANORO ELLIPTA) 62.5-25 MCG/INH AEPB Inhale 1 puff into the lungs daily.    [name] atorvastatin (LIPITOR) 40 MG tablet Take 1  tablet (40 mg total) by mouth daily. 90 tablet 1   No current facility-administered medications for this visit.    Allergies:   Rosuvastatin    Social History:  The patient  reports that she has quit smoking. Her smoking use included cigarettes and e-cigarettes. She has a 40.00 pack-year smoking history. She has never used smokeless tobacco. She reports that she does not drink alcohol and does not use drugs.   Family History:  The patient's family history includes Cancer in her mother; Stroke in her sister.    ROS:  Please see the history of present illness.   Otherwise, review of systems are positive for none.   All other systems are reviewed and negative.    PHYSICAL EXAM: VS:  BP 130/76   Pulse 73   Temp (!) 97.5 F (36.4 C)   Ht 5\' 3"  (1.6 m)   Wt 151 lb 12.8 oz (68.9 kg)   SpO2 97%   BMI 26.89 kg/m  , BMI Body mass index is 26.89 kg/m. GENERAL:  Well appearing HEENT: Pupils equal round and reactive, fundi not visualized, oral mucosa unremarkable NECK:  No jugular venous distention, waveform within normal limits, carotid upstroke brisk and symmetric, no bruits LUNGS:  Clear to auscultation bilaterally HEART:  RRR.  PMI not displaced or sustained,S1 and S2 within normal limits, no S3, no S4, no clicks, no rubs, no murmurs ABD:  Flat, positive bowel sounds normal in frequency in pitch, no bruits, no rebound, no guarding, no midline pulsatile mass, no hepatomegaly, no splenomegaly EXT:  2 plus pulses throughout, no edema, no cyanosis no clubbing SKIN:  No rashes no nodules NEURO:  Cranial nerves II through XII grossly intact, motor grossly intact throughout PSYCH:  Cognitively intact, oriented to person place and time  EKG:  EKG is not ordered today. The ekg ordered 11/20/17 demonstrates accelerated junctional rhythm.  R axis deviation.   02/11/18: Sinus rhythm.  LAD.  Poor R wave progression.    Exercise Myoview 02/2018:  Nuclear stress EF: 58%.  The left ventricular  ejection fraction is normal (55-65%).  Blood pressure demonstrated a normal response to exercise.  There was no ST segment deviation noted during stress.  No T wave inversion was noted during stress.  This is a low risk study.   No reversible ischemia. LVEF 58% with normal wall motion. This is a low risk study.   Recent Labs: No results found for requested labs within last 8760 hours.   11/18/16: Total cholesterol 174, triglycerides 206, HDL 40, LDL 93 TSH 1.0 WBC 11, hemoglobin 15.5, hematocrit 45, platelets 2 8 Sodium 142, potassium 4.4, BUN 11, creatinine 0.94 AST 49, ALT 42  Lipid Panel    Component  Value Date/Time   CHOL 134 11/26/2017 0920   TRIG 168 (H) 11/26/2017 0920   HDL 37 (L) 11/26/2017 0920   CHOLHDL 3.6 11/26/2017 0920   LDLCALC 63 11/26/2017 0920      Wt Readings from Last 3 Encounters:  11/15/20 151 lb 12.8 oz (68.9 kg)  05/16/20 153 lb 6.4 oz (69.6 kg)  03/19/20 159 lb (72.1 kg)      ASSESSMENT AND PLAN:  # Coronary calcification:  # Aorta atheroslerosis: LDL was 48 on 10/2020.  Continue aspirin and atorvastatin.  # Hypertension: Blood pressure is well-controlled.  She is no longer on lisinopril.  # Ascending aorta aneurysm: 4.1 cm on chest CT in 2018. I personally measured the aorta on her most recent screening CT and it was 4.05 cm.  Blood pressure control will be key.  # Tobacco abuse: Patient was congratulated on smoking cessation and encouraged to quit the vape as well.   Current medicines are reviewed at length with the patient today.  The patient does not have concerns regarding medicines.  The following changes have been made:  no change  Labs/ tests ordered today include:   No orders of the defined types were placed in this encounter.    Disposition:   FU with Lavina Resor C. Oval Linsey, MD, [facility] in 1 year.     Signed, Lauria Depoy C. Oval Linsey, MD, [facility]  11/15/2020 2:07 PM    [place]

## 2020-11-15 NOTE — Patient Instructions (Addendum)
Medication Instructions:  Your physician recommends that you continue on your current medications as directed. Please refer to the Current Medication list given to you today.   *If you need a refill on your cardiac medications before your next appointment, please call your pharmacy*  Lab Work: NONE   Testing/Procedures: NONE  Follow-Up: At [facility], you and your health needs are our priority.  As part of our continuing mission to provide you with exceptional heart care, we have created designated Provider Care Teams.  These Care Teams include your primary Cardiologist (physician) and Advanced Practice Providers (APPs -  Physician Assistants and Nurse Practitioners) who all work together to provide you with the care you need, when you need it.  We recommend signing up for the patient portal called "MyChart".  Sign up information is provided on this After Visit Summary.  MyChart is used to connect with patients for Virtual Visits (Telemedicine).  Patients are able to view lab/test results, encounter notes, upcoming appointments, etc.  Non-urgent messages can be sent to your provider as well.   To learn more about what you can do with MyChart, go to [email or web address].    Your next appointment:   12 month(s) You will receive a reminder letter in the mail two months in advance. If you don't receive a letter, please call our office to schedule the follow-up appointment.   The format for your next appointment:   In Person  Provider:   You may see Tiffany [place], MD or one of the following Advanced Practice Providers on your designated Care Team:    Luke Kilroy, PA-C  Callie Goodrich, PA-C  Jesse Cleaver, FNP    

## 2020-11-27 DIAGNOSIS — L659 Nonscarring hair loss, unspecified: Secondary | ICD-10-CM | POA: Diagnosis not present

## 2020-11-27 DIAGNOSIS — B351 Tinea unguium: Secondary | ICD-10-CM | POA: Diagnosis not present

## 2020-12-05 DIAGNOSIS — L659 Nonscarring hair loss, unspecified: Secondary | ICD-10-CM | POA: Diagnosis not present

## 2020-12-06 DIAGNOSIS — R159 Full incontinence of feces: Secondary | ICD-10-CM | POA: Diagnosis not present

## 2020-12-17 DIAGNOSIS — I739 Peripheral vascular disease, unspecified: Secondary | ICD-10-CM | POA: Diagnosis not present

## 2020-12-17 DIAGNOSIS — L84 Corns and callosities: Secondary | ICD-10-CM | POA: Diagnosis not present

## 2020-12-17 DIAGNOSIS — L602 Onychogryphosis: Secondary | ICD-10-CM | POA: Diagnosis not present

## 2020-12-22 ENCOUNTER — Encounter (HOSPITAL_COMMUNITY): Payer: Self-pay

## 2020-12-22 ENCOUNTER — Other Ambulatory Visit: Payer: Self-pay

## 2020-12-22 DIAGNOSIS — Z5321 Procedure and treatment not carried out due to patient leaving prior to being seen by health care provider: Secondary | ICD-10-CM | POA: Diagnosis not present

## 2020-12-22 DIAGNOSIS — R079 Chest pain, unspecified: Secondary | ICD-10-CM | POA: Diagnosis not present

## 2020-12-22 NOTE — ED Notes (Signed)
Pt took a baby ASA prior to arrival and states it helped her pain

## 2020-12-22 NOTE — ED Triage Notes (Signed)
Pt came in with c/o chest pain that started tonight. Last night she was having back, neck and shoulder pain, but states that tonight she started having L sided chest pain. Describes the pain as pressure.

## 2020-12-23 ENCOUNTER — Emergency Department (HOSPITAL_COMMUNITY)
Admission: EM | Admit: 2020-12-23 | Discharge: 2020-12-23 | Disposition: A | Discharge status: Home / Self Care | Payer: Medicare Other | Attending: Emergency Medicine | Admitting: Emergency Medicine

## 2020-12-23 DIAGNOSIS — R079 Chest pain, unspecified: Secondary | ICD-10-CM | POA: Diagnosis not present

## 2020-12-23 LAB — COMPREHENSIVE METABOLIC PANEL
ALT: 38 U/L (ref 0–44)
AST: 32 U/L (ref 15–41)
Albumin: 3.8 g/dL (ref 3.5–5.0)
Alkaline Phosphatase: 77 U/L (ref 38–126)
Anion gap: 11 (ref 5–15)
BUN: 18 mg/dL (ref 8–23)
CO2: 23 mmol/L (ref 22–32)
Calcium: 9.2 mg/dL (ref 8.9–10.3)
Chloride: 105 mmol/L (ref 98–111)
Creatinine, Ser: 0.98 mg/dL (ref 0.44–1.00)
GFR, Estimated: 59 mL/min — ABNORMAL LOW (ref >60)
Glucose, Bld: 141 mg/dL — ABNORMAL HIGH (ref 70–99)
Potassium: 3.7 mmol/L (ref 3.5–5.1)
Sodium: 139 mmol/L (ref 135–145)
Total Bilirubin: 0.6 mg/dL (ref 0.3–1.2)
Total Protein: 7.3 g/dL (ref 6.5–8.1)

## 2020-12-23 LAB — CBC
HCT: 45.3 % (ref 36.0–46.0)
Hemoglobin: 15.5 g/dL — ABNORMAL HIGH (ref 12.0–15.0)
MCH: 31.2 pg (ref 26.0–34.0)
MCHC: 34.2 g/dL (ref 30.0–36.0)
MCV: 91.1 fL (ref 80.0–100.0)
Platelets: 165 10*3/uL (ref 150–400)
RBC: 4.97 MIL/uL (ref 3.87–5.11)
RDW: 13.4 % (ref 11.5–15.5)
WBC: 9.6 10*3/uL (ref 4.0–10.5)
nRBC: 0 % (ref 0.0–0.2)

## 2020-12-23 LAB — TROPONIN I (HIGH SENSITIVITY): Troponin I (High Sensitivity): <2 ng/L (ref <18)

## 2021-01-18 DIAGNOSIS — R748 Abnormal levels of other serum enzymes: Secondary | ICD-10-CM | POA: Diagnosis not present

## 2021-01-18 DIAGNOSIS — K760 Fatty (change of) liver, not elsewhere classified: Secondary | ICD-10-CM | POA: Diagnosis not present

## 2021-01-23 DIAGNOSIS — K1379 Other lesions of oral mucosa: Secondary | ICD-10-CM | POA: Diagnosis not present

## 2021-01-23 DIAGNOSIS — E1140 Type 2 diabetes mellitus with diabetic neuropathy, unspecified: Secondary | ICD-10-CM | POA: Diagnosis not present

## 2021-01-23 DIAGNOSIS — E1169 Type 2 diabetes mellitus with other specified complication: Secondary | ICD-10-CM | POA: Diagnosis not present

## 2021-01-30 DIAGNOSIS — M533 Sacrococcygeal disorders, not elsewhere classified: Secondary | ICD-10-CM | POA: Diagnosis not present

## 2021-02-08 DIAGNOSIS — H401131 Primary open-angle glaucoma, bilateral, mild stage: Secondary | ICD-10-CM | POA: Diagnosis not present

## 2021-02-08 DIAGNOSIS — H02403 Unspecified ptosis of bilateral eyelids: Secondary | ICD-10-CM | POA: Diagnosis not present

## 2021-02-08 DIAGNOSIS — Z961 Presence of intraocular lens: Secondary | ICD-10-CM | POA: Diagnosis not present

## 2021-02-08 DIAGNOSIS — H04123 Dry eye syndrome of bilateral lacrimal glands: Secondary | ICD-10-CM | POA: Diagnosis not present

## 2021-02-25 DIAGNOSIS — E1351 Other specified diabetes mellitus with diabetic peripheral angiopathy without gangrene: Secondary | ICD-10-CM | POA: Diagnosis not present

## 2021-02-25 DIAGNOSIS — L84 Corns and callosities: Secondary | ICD-10-CM | POA: Diagnosis not present

## 2021-02-25 DIAGNOSIS — L602 Onychogryphosis: Secondary | ICD-10-CM | POA: Diagnosis not present

## 2021-03-07 DIAGNOSIS — L820 Inflamed seborrheic keratosis: Secondary | ICD-10-CM | POA: Diagnosis not present

## 2021-03-07 DIAGNOSIS — L821 Other seborrheic keratosis: Secondary | ICD-10-CM | POA: Diagnosis not present

## 2021-03-13 DIAGNOSIS — R140 Abdominal distension (gaseous): Secondary | ICD-10-CM | POA: Diagnosis not present

## 2021-03-18 DIAGNOSIS — M25512 Pain in left shoulder: Secondary | ICD-10-CM | POA: Diagnosis not present

## 2021-03-18 DIAGNOSIS — M47812 Spondylosis without myelopathy or radiculopathy, cervical region: Secondary | ICD-10-CM | POA: Diagnosis not present

## 2021-05-01 DIAGNOSIS — E1351 Other specified diabetes mellitus with diabetic peripheral angiopathy without gangrene: Secondary | ICD-10-CM | POA: Diagnosis not present

## 2021-05-01 DIAGNOSIS — M6281 Muscle weakness (generalized): Secondary | ICD-10-CM | POA: Diagnosis not present

## 2021-05-01 DIAGNOSIS — L84 Corns and callosities: Secondary | ICD-10-CM | POA: Diagnosis not present

## 2021-05-01 DIAGNOSIS — R2689 Other abnormalities of gait and mobility: Secondary | ICD-10-CM | POA: Diagnosis not present

## 2021-05-01 DIAGNOSIS — L602 Onychogryphosis: Secondary | ICD-10-CM | POA: Diagnosis not present

## 2021-05-01 DIAGNOSIS — M2041 Other hammer toe(s) (acquired), right foot: Secondary | ICD-10-CM | POA: Diagnosis not present

## 2021-05-06 DIAGNOSIS — G4730 Sleep apnea, unspecified: Secondary | ICD-10-CM | POA: Diagnosis not present

## 2021-05-06 DIAGNOSIS — E785 Hyperlipidemia, unspecified: Secondary | ICD-10-CM | POA: Diagnosis not present

## 2021-05-06 DIAGNOSIS — N183 Chronic kidney disease, stage 3 unspecified: Secondary | ICD-10-CM | POA: Diagnosis not present

## 2021-05-06 DIAGNOSIS — I129 Hypertensive chronic kidney disease with stage 1 through stage 4 chronic kidney disease, or unspecified chronic kidney disease: Secondary | ICD-10-CM | POA: Diagnosis not present

## 2021-05-06 DIAGNOSIS — Z7984 Long term (current) use of oral hypoglycemic drugs: Secondary | ICD-10-CM | POA: Diagnosis not present

## 2021-05-06 DIAGNOSIS — E1140 Type 2 diabetes mellitus with diabetic neuropathy, unspecified: Secondary | ICD-10-CM | POA: Diagnosis not present

## 2021-05-06 DIAGNOSIS — R5382 Chronic fatigue, unspecified: Secondary | ICD-10-CM | POA: Diagnosis not present

## 2021-07-03 DIAGNOSIS — N139 Obstructive and reflux uropathy, unspecified: Secondary | ICD-10-CM | POA: Diagnosis not present

## 2021-07-03 DIAGNOSIS — N8111 Cystocele, midline: Secondary | ICD-10-CM | POA: Diagnosis not present

## 2021-07-31 DIAGNOSIS — E1140 Type 2 diabetes mellitus with diabetic neuropathy, unspecified: Secondary | ICD-10-CM | POA: Diagnosis not present

## 2021-07-31 DIAGNOSIS — E785 Hyperlipidemia, unspecified: Secondary | ICD-10-CM | POA: Diagnosis not present

## 2021-07-31 DIAGNOSIS — I700 Atherosclerosis of aorta: Secondary | ICD-10-CM | POA: Diagnosis not present

## 2021-07-31 DIAGNOSIS — N183 Chronic kidney disease, stage 3 unspecified: Secondary | ICD-10-CM | POA: Diagnosis not present

## 2021-07-31 DIAGNOSIS — E1169 Type 2 diabetes mellitus with other specified complication: Secondary | ICD-10-CM | POA: Diagnosis not present

## 2021-07-31 DIAGNOSIS — I129 Hypertensive chronic kidney disease with stage 1 through stage 4 chronic kidney disease, or unspecified chronic kidney disease: Secondary | ICD-10-CM | POA: Diagnosis not present

## 2021-07-31 DIAGNOSIS — M25552 Pain in left hip: Secondary | ICD-10-CM | POA: Diagnosis not present

## 2021-07-31 DIAGNOSIS — F439 Reaction to severe stress, unspecified: Secondary | ICD-10-CM | POA: Diagnosis not present

## 2021-07-31 DIAGNOSIS — Z7984 Long term (current) use of oral hypoglycemic drugs: Secondary | ICD-10-CM | POA: Diagnosis not present

## 2021-08-02 DIAGNOSIS — N3941 Urge incontinence: Secondary | ICD-10-CM | POA: Diagnosis not present

## 2021-08-02 DIAGNOSIS — C670 Malignant neoplasm of trigone of bladder: Secondary | ICD-10-CM | POA: Diagnosis not present

## 2021-08-02 DIAGNOSIS — N8111 Cystocele, midline: Secondary | ICD-10-CM | POA: Diagnosis not present

## 2021-08-02 DIAGNOSIS — N816 Rectocele: Secondary | ICD-10-CM | POA: Diagnosis not present

## 2021-08-12 DIAGNOSIS — B351 Tinea unguium: Secondary | ICD-10-CM | POA: Diagnosis not present

## 2021-08-12 DIAGNOSIS — I739 Peripheral vascular disease, unspecified: Secondary | ICD-10-CM | POA: Diagnosis not present

## 2021-08-12 DIAGNOSIS — L602 Onychogryphosis: Secondary | ICD-10-CM | POA: Diagnosis not present

## 2021-08-12 DIAGNOSIS — L84 Corns and callosities: Secondary | ICD-10-CM | POA: Diagnosis not present

## 2021-09-03 ENCOUNTER — Other Ambulatory Visit: Payer: Self-pay | Admitting: Family Medicine

## 2021-09-03 DIAGNOSIS — Z1231 Encounter for screening mammogram for malignant neoplasm of breast: Secondary | ICD-10-CM

## 2021-09-06 DIAGNOSIS — Z20822 Contact with and (suspected) exposure to covid-19: Secondary | ICD-10-CM | POA: Diagnosis not present

## 2021-09-16 ENCOUNTER — Other Ambulatory Visit: Payer: Self-pay | Admitting: Sports Medicine

## 2021-09-16 ENCOUNTER — Ambulatory Visit
Admission: RE | Admit: 2021-09-16 | Discharge: 2021-09-16 | Disposition: A | Discharge status: Home / Self Care | Payer: Medicare Other | Source: Ambulatory Visit | Attending: Sports Medicine | Admitting: Sports Medicine

## 2021-09-16 DIAGNOSIS — M25511 Pain in right shoulder: Secondary | ICD-10-CM

## 2021-09-16 DIAGNOSIS — M542 Cervicalgia: Secondary | ICD-10-CM | POA: Diagnosis not present

## 2021-09-16 DIAGNOSIS — R2689 Other abnormalities of gait and mobility: Secondary | ICD-10-CM | POA: Diagnosis not present

## 2021-09-17 DIAGNOSIS — M47812 Spondylosis without myelopathy or radiculopathy, cervical region: Secondary | ICD-10-CM | POA: Diagnosis not present

## 2021-10-01 DIAGNOSIS — M419 Scoliosis, unspecified: Secondary | ICD-10-CM | POA: Diagnosis not present

## 2021-10-01 DIAGNOSIS — R262 Difficulty in walking, not elsewhere classified: Secondary | ICD-10-CM | POA: Diagnosis not present

## 2021-10-01 DIAGNOSIS — M6259 Muscle wasting and atrophy, not elsewhere classified, multiple sites: Secondary | ICD-10-CM | POA: Diagnosis not present

## 2021-10-01 DIAGNOSIS — R293 Abnormal posture: Secondary | ICD-10-CM | POA: Diagnosis not present

## 2021-10-02 DIAGNOSIS — E1151 Type 2 diabetes mellitus with diabetic peripheral angiopathy without gangrene: Secondary | ICD-10-CM | POA: Diagnosis not present

## 2021-10-02 DIAGNOSIS — M19071 Primary osteoarthritis, right ankle and foot: Secondary | ICD-10-CM | POA: Diagnosis not present

## 2021-10-02 DIAGNOSIS — M19072 Primary osteoarthritis, left ankle and foot: Secondary | ICD-10-CM | POA: Diagnosis not present

## 2021-10-07 ENCOUNTER — Other Ambulatory Visit: Payer: Self-pay

## 2021-10-07 ENCOUNTER — Ambulatory Visit
Admission: RE | Admit: 2021-10-07 | Discharge: 2021-10-07 | Disposition: A | Discharge status: Home / Self Care | Payer: Medicare Other | Source: Ambulatory Visit | Attending: Family Medicine | Admitting: Family Medicine

## 2021-10-07 DIAGNOSIS — Z1231 Encounter for screening mammogram for malignant neoplasm of breast: Secondary | ICD-10-CM

## 2021-10-07 DIAGNOSIS — N3001 Acute cystitis with hematuria: Secondary | ICD-10-CM | POA: Diagnosis not present

## 2021-10-07 DIAGNOSIS — R399 Unspecified symptoms and signs involving the genitourinary system: Secondary | ICD-10-CM | POA: Diagnosis not present

## 2021-10-11 DIAGNOSIS — M24812 Other specific joint derangements of left shoulder, not elsewhere classified: Secondary | ICD-10-CM | POA: Diagnosis not present

## 2021-10-15 DIAGNOSIS — M545 Low back pain, unspecified: Secondary | ICD-10-CM | POA: Diagnosis not present

## 2021-10-16 ENCOUNTER — Ambulatory Visit
Admission: RE | Admit: 2021-10-16 | Discharge: 2021-10-16 | Disposition: A | Discharge status: Home / Self Care | Payer: Medicare Other | Source: Ambulatory Visit | Attending: Sports Medicine | Admitting: Sports Medicine

## 2021-10-16 ENCOUNTER — Other Ambulatory Visit: Payer: Self-pay | Admitting: Sports Medicine

## 2021-10-16 ENCOUNTER — Other Ambulatory Visit: Payer: Self-pay

## 2021-10-16 DIAGNOSIS — M545 Low back pain, unspecified: Secondary | ICD-10-CM

## 2021-10-21 DIAGNOSIS — Z20822 Contact with and (suspected) exposure to covid-19: Secondary | ICD-10-CM | POA: Diagnosis not present

## 2021-10-25 DIAGNOSIS — M545 Low back pain, unspecified: Secondary | ICD-10-CM | POA: Diagnosis not present

## 2021-10-25 DIAGNOSIS — R293 Abnormal posture: Secondary | ICD-10-CM | POA: Diagnosis not present

## 2021-10-25 DIAGNOSIS — M6259 Muscle wasting and atrophy, not elsewhere classified, multiple sites: Secondary | ICD-10-CM | POA: Diagnosis not present

## 2021-10-25 DIAGNOSIS — R262 Difficulty in walking, not elsewhere classified: Secondary | ICD-10-CM | POA: Diagnosis not present

## 2021-10-25 DIAGNOSIS — M546 Pain in thoracic spine: Secondary | ICD-10-CM | POA: Diagnosis not present

## 2021-10-25 DIAGNOSIS — M419 Scoliosis, unspecified: Secondary | ICD-10-CM | POA: Diagnosis not present

## 2021-10-29 DIAGNOSIS — L84 Corns and callosities: Secondary | ICD-10-CM | POA: Diagnosis not present

## 2021-10-29 DIAGNOSIS — E1151 Type 2 diabetes mellitus with diabetic peripheral angiopathy without gangrene: Secondary | ICD-10-CM | POA: Diagnosis not present

## 2021-10-29 DIAGNOSIS — M19071 Primary osteoarthritis, right ankle and foot: Secondary | ICD-10-CM | POA: Diagnosis not present

## 2021-10-29 DIAGNOSIS — B351 Tinea unguium: Secondary | ICD-10-CM | POA: Diagnosis not present

## 2021-10-29 DIAGNOSIS — M19072 Primary osteoarthritis, left ankle and foot: Secondary | ICD-10-CM | POA: Diagnosis not present

## 2021-10-31 DIAGNOSIS — M419 Scoliosis, unspecified: Secondary | ICD-10-CM | POA: Diagnosis not present

## 2021-10-31 DIAGNOSIS — R262 Difficulty in walking, not elsewhere classified: Secondary | ICD-10-CM | POA: Diagnosis not present

## 2021-10-31 DIAGNOSIS — R293 Abnormal posture: Secondary | ICD-10-CM | POA: Diagnosis not present

## 2021-10-31 DIAGNOSIS — M545 Low back pain, unspecified: Secondary | ICD-10-CM | POA: Diagnosis not present

## 2021-10-31 DIAGNOSIS — M6259 Muscle wasting and atrophy, not elsewhere classified, multiple sites: Secondary | ICD-10-CM | POA: Diagnosis not present

## 2021-10-31 DIAGNOSIS — M546 Pain in thoracic spine: Secondary | ICD-10-CM | POA: Diagnosis not present

## 2021-11-01 DIAGNOSIS — M419 Scoliosis, unspecified: Secondary | ICD-10-CM | POA: Diagnosis not present

## 2021-11-01 DIAGNOSIS — M546 Pain in thoracic spine: Secondary | ICD-10-CM | POA: Diagnosis not present

## 2021-11-01 DIAGNOSIS — R293 Abnormal posture: Secondary | ICD-10-CM | POA: Diagnosis not present

## 2021-11-01 DIAGNOSIS — M6259 Muscle wasting and atrophy, not elsewhere classified, multiple sites: Secondary | ICD-10-CM | POA: Diagnosis not present

## 2021-11-01 DIAGNOSIS — R262 Difficulty in walking, not elsewhere classified: Secondary | ICD-10-CM | POA: Diagnosis not present

## 2021-11-01 DIAGNOSIS — M545 Low back pain, unspecified: Secondary | ICD-10-CM | POA: Diagnosis not present

## 2021-11-04 DIAGNOSIS — Z20822 Contact with and (suspected) exposure to covid-19: Secondary | ICD-10-CM | POA: Diagnosis not present

## 2021-11-05 DIAGNOSIS — R293 Abnormal posture: Secondary | ICD-10-CM | POA: Diagnosis not present

## 2021-11-05 DIAGNOSIS — M6259 Muscle wasting and atrophy, not elsewhere classified, multiple sites: Secondary | ICD-10-CM | POA: Diagnosis not present

## 2021-11-05 DIAGNOSIS — M545 Low back pain, unspecified: Secondary | ICD-10-CM | POA: Diagnosis not present

## 2021-11-05 DIAGNOSIS — M419 Scoliosis, unspecified: Secondary | ICD-10-CM | POA: Diagnosis not present

## 2021-11-05 DIAGNOSIS — R262 Difficulty in walking, not elsewhere classified: Secondary | ICD-10-CM | POA: Diagnosis not present

## 2021-11-05 DIAGNOSIS — M546 Pain in thoracic spine: Secondary | ICD-10-CM | POA: Diagnosis not present

## 2021-11-11 DIAGNOSIS — M545 Low back pain, unspecified: Secondary | ICD-10-CM | POA: Diagnosis not present

## 2021-11-12 DIAGNOSIS — N183 Chronic kidney disease, stage 3 unspecified: Secondary | ICD-10-CM | POA: Diagnosis not present

## 2021-11-12 DIAGNOSIS — G459 Transient cerebral ischemic attack, unspecified: Secondary | ICD-10-CM | POA: Diagnosis not present

## 2021-11-12 DIAGNOSIS — Z23 Encounter for immunization: Secondary | ICD-10-CM | POA: Diagnosis not present

## 2021-11-12 DIAGNOSIS — J449 Chronic obstructive pulmonary disease, unspecified: Secondary | ICD-10-CM | POA: Diagnosis not present

## 2021-11-12 DIAGNOSIS — Z7984 Long term (current) use of oral hypoglycemic drugs: Secondary | ICD-10-CM | POA: Diagnosis not present

## 2021-11-12 DIAGNOSIS — F17200 Nicotine dependence, unspecified, uncomplicated: Secondary | ICD-10-CM | POA: Diagnosis not present

## 2021-11-12 DIAGNOSIS — E785 Hyperlipidemia, unspecified: Secondary | ICD-10-CM | POA: Diagnosis not present

## 2021-11-12 DIAGNOSIS — Z0000 Encounter for general adult medical examination without abnormal findings: Secondary | ICD-10-CM | POA: Diagnosis not present

## 2021-11-12 DIAGNOSIS — R3915 Urgency of urination: Secondary | ICD-10-CM | POA: Diagnosis not present

## 2021-11-12 DIAGNOSIS — I700 Atherosclerosis of aorta: Secondary | ICD-10-CM | POA: Diagnosis not present

## 2021-11-12 DIAGNOSIS — E1140 Type 2 diabetes mellitus with diabetic neuropathy, unspecified: Secondary | ICD-10-CM | POA: Diagnosis not present

## 2021-11-12 DIAGNOSIS — I129 Hypertensive chronic kidney disease with stage 1 through stage 4 chronic kidney disease, or unspecified chronic kidney disease: Secondary | ICD-10-CM | POA: Diagnosis not present

## 2021-11-13 ENCOUNTER — Other Ambulatory Visit: Payer: Self-pay | Admitting: Family Medicine

## 2021-11-13 DIAGNOSIS — R262 Difficulty in walking, not elsewhere classified: Secondary | ICD-10-CM | POA: Diagnosis not present

## 2021-11-13 DIAGNOSIS — M546 Pain in thoracic spine: Secondary | ICD-10-CM | POA: Diagnosis not present

## 2021-11-13 DIAGNOSIS — G459 Transient cerebral ischemic attack, unspecified: Secondary | ICD-10-CM

## 2021-11-13 DIAGNOSIS — M6259 Muscle wasting and atrophy, not elsewhere classified, multiple sites: Secondary | ICD-10-CM | POA: Diagnosis not present

## 2021-11-13 DIAGNOSIS — M545 Low back pain, unspecified: Secondary | ICD-10-CM | POA: Diagnosis not present

## 2021-11-13 DIAGNOSIS — R293 Abnormal posture: Secondary | ICD-10-CM | POA: Diagnosis not present

## 2021-11-13 DIAGNOSIS — M419 Scoliosis, unspecified: Secondary | ICD-10-CM | POA: Diagnosis not present

## 2021-11-14 DIAGNOSIS — R262 Difficulty in walking, not elsewhere classified: Secondary | ICD-10-CM | POA: Diagnosis not present

## 2021-11-14 DIAGNOSIS — M419 Scoliosis, unspecified: Secondary | ICD-10-CM | POA: Diagnosis not present

## 2021-11-14 DIAGNOSIS — R293 Abnormal posture: Secondary | ICD-10-CM | POA: Diagnosis not present

## 2021-11-14 DIAGNOSIS — M545 Low back pain, unspecified: Secondary | ICD-10-CM | POA: Diagnosis not present

## 2021-11-14 DIAGNOSIS — M546 Pain in thoracic spine: Secondary | ICD-10-CM | POA: Diagnosis not present

## 2021-11-14 DIAGNOSIS — M6259 Muscle wasting and atrophy, not elsewhere classified, multiple sites: Secondary | ICD-10-CM | POA: Diagnosis not present

## 2021-11-15 ENCOUNTER — Other Ambulatory Visit: Payer: Self-pay

## 2021-11-15 ENCOUNTER — Ambulatory Visit (INDEPENDENT_AMBULATORY_CARE_PROVIDER_SITE_OTHER): Payer: Medicare Other | Admitting: Cardiovascular Disease

## 2021-11-15 ENCOUNTER — Encounter (HOSPITAL_BASED_OUTPATIENT_CLINIC_OR_DEPARTMENT_OTHER): Payer: Self-pay | Admitting: Cardiovascular Disease

## 2021-11-15 DIAGNOSIS — I2510 Atherosclerotic heart disease of native coronary artery without angina pectoris: Secondary | ICD-10-CM

## 2021-11-15 DIAGNOSIS — I7121 Aneurysm of the ascending aorta, without rupture: Secondary | ICD-10-CM

## 2021-11-15 DIAGNOSIS — I10 Essential (primary) hypertension: Secondary | ICD-10-CM

## 2021-11-15 NOTE — Patient Instructions (Signed)
Medication Instructions:  Your physician recommends that you continue on your current medications as directed. Please refer to the Current Medication list given to you today.   *If you need a refill on your cardiac medications before your next appointment, please call your pharmacy*  Lab Work: NONE  Testing/Procedures: Your physician has requested that you have an echocardiogram. Echocardiography is a painless test that uses sound waves to create images of your heart. It provides your doctor with information about the size and shape of your heart and how well your heart's chambers and valves are working. This procedure takes approximately one hour. There are no restrictions for this procedure.   Follow-Up: At [facility], you and your health needs are our priority.  As part of our continuing mission to provide you with exceptional heart care, we have created designated Provider Care Teams.  These Care Teams include your primary Cardiologist (physician) and Advanced Practice Providers (APPs -  Physician Assistants and Nurse Practitioners) who all work together to provide you with the care you need, when you need it.  We recommend signing up for the patient portal called "MyChart".  Sign up information is provided on this After Visit Summary.  MyChart is used to connect with patients for Virtual Visits (Telemedicine).  Patients are able to view lab/test results, encounter notes, upcoming appointments, etc.  Non-urgent messages can be sent to your provider as well.   To learn more about what you can do with MyChart, go to [email or web address].    Your next appointment:   12 month(s)  The format for your next appointment:   In Person  Provider:   Tiffany [place], MD         

## 2021-11-15 NOTE — Assessment & Plan Note (Signed)
Incidentally noted on CT scan.  She has no angina.  She has started exercising more regularly and feels well.  Continue aspirin and atorvastatin.  LDL is at goal.  She was encouraged to keep up the exercise.

## 2021-11-15 NOTE — Progress Notes (Signed)
Cardiology Office Note  Date:  11/15/2021   ID:  [name], DOB 1941-03-09, MRN [id number]  PCP:  Harlan Stains, MD  Cardiologist:   Skeet Latch, MD   No chief complaint on file.  History of Present Illness: [name] is a 80 y.o. female with coronary artery calcification, diabetes, hypertension, mild ascending aortic aneurysm, Crohn's disease in remission, bladder cancer in remission, COPD, and prior tobacco abuse here for follow up.  She was initially seen 11/2017 for chest pain and evaluation of coronary calcifications noted on CT scan.  [name] had a lung cancer screening CT on 10/2017.  She was noted to have three-vessel CAD and a 4.1 cm ascending aortic aneurysm.  At the time she was feeling well so no stress testing was pursued.  She then had an episode of chest pain that occurred when lying in bed.  She was referred for an exercise Myoview 02/2018 that revealed LVEF 58% with no  ischemia.  She achieved 7 METS on a Bruce protocol.    [name] was diagnosed with diabetes in 2021.  Since then she began working on her diet.  She was started on Metformin and her glucose had been well-controlled.  She saw Sande Rives, PA-C on 05/2020 for preoperative clearance prior to an eye surgery.  She was started on atorvastatin due to her atherosclerosis and has tolerated this well.  Lipids have been well-controlled.   Today, she reports that 6 weeks ago she was suddenly unable to pick up paper with her right UE and had a severe headache afterwards. This incident did not last long and has not recurred. Otherwise she has been feeling good overall. Her main complaint is concerning her back issues, and she is now in physical therapy for this. She has been riding a bike 15 min every day, and 30 min of stretching exercises. Typically she feels will while exercising, and her breathing has been okay. For the past month her blood pressure has been elevated, and lisinopril has been ordered. She  will receive this in the mail soon. Her diet has been maintained, and she continues to avoid sweets. However, she has gained some weight. At first metformin did help her lose weight, this was also restarted and she is waiting on it to arrive as well. She denies any palpitations, chest pain, or shortness of breath. No lightheadedness, syncope, orthopnea, PND, lower extremity edema or exertional symptoms.  Past Medical History:  Diagnosis Date   Aneurysm of ascending aorta 06/11/2018   4.1cm on CT 11/2017.   Arthritis    Asymptomatic coronary heart disease 11/20/2017   Cancer ([place]) 2004   bladder    COPD (chronic obstructive pulmonary disease) (HCC)    Crohn's disease (HCC)    Diabetes mellitus without complication ([place])    Essential hypertension 11/20/2017   Gait difficulty    OAB (overactive bladder)    Smoker    Tobacco abuse 11/20/2017   Vertigo     Past Surgical History:  Procedure Laterality Date   BLADDER SURGERY     CHOLECYSTECTOMY     FOOT ARTHRODESIS Left 07/02/2017   Procedure: Left Subtalar Arthrodesis;  Surgeon: Wylene Simmer, MD;  Location: [place];  Service: Orthopedics;  Laterality: Left;   FRACTURE SURGERY     HARDWARE REMOVAL Left 04/24/2016   Procedure: REMOVAL OF DEEP IMPLANT LEFT FOOT;  Surgeon: Wylene Simmer, MD;  Location: [place];  Service: Orthopedics;  Laterality: Left;  ANESTHESIA:  REGIONAL/MONITOR ANESTHESIA   HARDWARE REMOVAL Left 07/02/2017   Procedure: Removal of Deep Implants;  Surgeon: Wylene Simmer, MD;  Location: [place];  Service: Orthopedics;  Laterality: Left;   JOINT REPLACEMENT     knee   throat polyps     TUBAL LIGATION       Current Outpatient Medications  Medication Sig Dispense Refill   aspirin EC 81 MG tablet Take 81 mg by mouth daily.     atorvastatin (LIPITOR) 40 MG tablet Take 1 tablet (40 mg total) by mouth daily. 90 tablet 1   calcium-vitamin D (OSCAL WITH D) 500-200 MG-UNIT tablet  Take 1 tablet by mouth.     folic acid (FOLVITE) 626 MCG tablet Take 400 mcg by mouth daily.     latanoprost (XALATAN) 0.005 % ophthalmic solution Place 1 drop into both eyes at bedtime.     lidocaine (LIDODERM) 5 % Place 1 patch onto the skin daily. Remove & Discard patch within 12 hours or as directed by MD 30 patch 0   lisinopril (ZESTRIL) 2.5 MG tablet Take 2.5 mg by mouth daily.     metFORMIN (GLUCOPHAGE) 1000 MG tablet Take 1,000 mg by mouth at bedtime.     mirabegron ER (MYRBETRIQ) 50 MG TB24 tablet Take 50 mg by mouth daily.     Multiple Vitamins-Minerals (CENTRUM ULTRA WOMENS PO) Take by mouth.     RESTASIS 0.05 % ophthalmic emulsion Place 1 drop into both eyes in the morning, at noon, and at bedtime.     umeclidinium-vilanterol (ANORO ELLIPTA) 62.5-25 MCG/INH AEPB Inhale 1 puff into the lungs daily.     No current facility-administered medications for this visit.    Allergies:   Rosuvastatin    Social History:  The patient  reports that she has quit smoking. Her smoking use included cigarettes and e-cigarettes. She has a 40.00 pack-year smoking history. She has never used smokeless tobacco. She reports that she does not drink alcohol and does not use drugs.   Family History:  The patient's family history includes Cancer in her mother; Stroke in her sister.    ROS:   Please see the history of present illness. (+) Acute right UE weakness with headache (once 6 weeks ago) (+) Back pain All other systems are reviewed and negative.   PHYSICAL EXAM: VS:  BP (!) 148/84 (BP Location: Right Arm, Patient Position: Sitting, Cuff Size: Normal)   Pulse 70   Ht 5\' 3"  (1.6 m)   Wt 166 lb 11.2 oz (75.6 kg)   BMI 29.53 kg/m  , BMI Body mass index is 29.53 kg/m. GENERAL:  Well appearing HEENT: Pupils equal round and reactive, fundi not visualized, oral mucosa unremarkable NECK:  No jugular venous distention, waveform within normal limits, carotid upstroke brisk and symmetric, no  bruits LUNGS:  Clear to auscultation bilaterally HEART:  RRR.  PMI not displaced or sustained,S1 and S2 within normal limits, no S3, no S4, no clicks, no rubs, no murmurs ABD:  Flat, positive bowel sounds normal in frequency in pitch, no bruits, no rebound, no guarding, no midline pulsatile mass, no hepatomegaly, no splenomegaly EXT:  2 plus pulses throughout, no edema, no cyanosis no clubbing SKIN:  No rashes no nodules NEURO:  Cranial nerves II through XII grossly intact, motor grossly intact throughout PSYCH:  Cognitively intact, oriented to person place and time  EKG:   11/15/2021: Sinus rhythm. Rate 70 bpm. RBBB. LAFB. 11/15/2020: EKG was not ordered. 02/11/18: Sinus rhythm.  LAD.  Poor R wave progression.   11/20/17: accelerated junctional rhythm.  R axis deviation.    CT Chest 06/15/2020: COMPARISON:  10/30/2017   FINDINGS: Cardiovascular: The heart size is normal. No substantial pericardial effusion. Coronary artery calcification is evident. Atherosclerotic calcification is noted in the wall of the thoracic aorta.   Mediastinum/Nodes: No mediastinal lymphadenopathy. No evidence for gross hilar lymphadenopathy although assessment is limited by the lack of intravenous contrast on today's study. The esophagus has normal imaging features. There is no axillary lymphadenopathy.   Lungs/Pleura: Centrilobular and paraseptal emphysema evident. Biapical pleuroparenchymal scarring evident. Tiny pulmonary nodules identified previously are stable. No new suspicious pulmonary nodule or mass. No focal airspace consolidation. No pleural effusion.   Upper Abdomen: Interval progression of well-defined homogeneous low-density exophytic lesion upper pole right kidney, now measuring 7.8 cm, likely a cyst.   Musculoskeletal: No worrisome lytic or sclerotic osseous abnormality.   IMPRESSION: 1. Lung-RADS 2, benign appearance or behavior. Continue annual screening with low-dose chest CT without  contrast in 12 months. 2. 7.8 cm homogeneous well-defined low-density lesion identified upper right kidney but incompletely visualized. This is progressed from 7.0 cm on the prior study and likely a cyst. 3. Aortic Atherosclerosis (ICD10-I70.0) and Emphysema (ICD10-J43.9).  Echo 09/29/2019:  1. Left ventricular ejection fraction, by visual estimation, is 60 to  65%. The left ventricle has normal function. Normal left ventricular size.  There is no left ventricular hypertrophy.   2. Normal GLS.   3. Global right ventricle has normal systolic function.The right  ventricular size is normal. No increase in right ventricular wall  thickness.   4. Left atrial size was normal.   5. Right atrial size was normal.   6. The mitral valve is normal in structure. Trace mitral valve  regurgitation. No evidence of mitral stenosis.   7. The tricuspid valve is normal in structure. Tricuspid valve  regurgitation was not visualized by color flow Doppler.   8. The aortic valve is normal in structure. Aortic valve regurgitation  was not visualized by color flow Doppler. Structurally normal aortic  valve, with no evidence of sclerosis or stenosis.   9. The pulmonic valve was normal in structure. Pulmonic valve  regurgitation is not visualized by color flow Doppler.  10. Normal pulmonary artery systolic pressure.  11. The inferior vena cava is normal in size with greater than 50%  respiratory variability, suggesting right atrial pressure of 3 mmHg.   Exercise Myoview 02/2018: Nuclear stress EF: 58%. The left ventricular ejection fraction is normal (55-65%). Blood pressure demonstrated a normal response to exercise. There was no ST segment deviation noted during stress. No T wave inversion was noted during stress. This is a low risk study.   No reversible ischemia. LVEF 58% with normal wall motion. This is a low risk study.  Recent Labs: 12/23/2020: ALT 38; BUN 18; Creatinine, Ser 0.98; Hemoglobin 15.5;  Platelets 165; Potassium 3.7; Sodium 139   11/18/16: Total cholesterol 174, triglycerides 206, HDL 40, LDL 93 TSH 1.0 WBC 11, hemoglobin 15.5, hematocrit 45, platelets 2 8 Sodium 142, potassium 4.4, BUN 11, creatinine 0.94 AST 49, ALT 42  Lipid Panel    Component Value Date/Time   CHOL 134 11/26/2017 0920   TRIG 168 (H) 11/26/2017 0920   HDL 37 (L) 11/26/2017 0920   CHOLHDL 3.6 11/26/2017 0920   LDLCALC 63 11/26/2017 0920    Wt Readings from Last 3 Encounters:  11/15/21 166 lb 11.2 oz (75.6 kg)  12/22/20 152  lb (68.9 kg)  11/15/20 151 lb 12.8 oz (68.9 kg)    ASSESSMENT AND PLAN:  CAD (coronary artery disease), native coronary artery Incidentally noted on CT scan.  She has no angina.  She has started exercising more regularly and feels well.  Continue aspirin and atorvastatin.  LDL is at goal.  She was encouraged to keep up the exercise.  Aneurysm of ascending aorta (HCC) Ascending aorta has been around 4.1 cm.  Its been a couple years since it was checked.  We will get an echocardiogram to reassess.  Essential hypertension Blood pressure uncontrolled both here and at home.  She saw Dr. Dema Severin earlier this week and lisinopril was ordered.  She has not yet received in the mail.  Blood pressure goal less than 130/80.   Low threshold to add a beta blocker if her BP is not at goal on lisinopril given that she has a small ascending aorta aneurysm.    Current medicines are reviewed at length with the patient today.  The patient does not have concerns regarding medicines.  The following changes have been made:  no change  Labs/ tests ordered today include:   Orders Placed This Encounter  Procedures   EKG 12-Lead   ECHOCARDIOGRAM COMPLETE      Disposition:   FU with Horald Birky C. Oval Linsey, MD, [facility] in 1 year.  I,Mathew Stumpf,acting as a Education administrator for Skeet Latch, MD.,have documented all relevant documentation on the behalf of Skeet Latch, MD,as directed by  Skeet Latch, MD while in the presence of Skeet Latch, MD.  I, [place] Oval Linsey, MD have reviewed all documentation for this visit.  The documentation of the exam, diagnosis, procedures, and orders on 11/15/2021 are all accurate and complete.   Signed, Chianne Byrns C. Oval Linsey, MD, [facility]  11/15/2021 11:04 AM    [place]

## 2021-11-15 NOTE — Assessment & Plan Note (Signed)
Ascending aorta has been around 4.1 cm.  Its been a couple years since it was checked.  We will get an echocardiogram to reassess.

## 2021-11-15 NOTE — Assessment & Plan Note (Addendum)
Blood pressure uncontrolled both here and at home.  She saw Dr. Dema Severin earlier this week and lisinopril was ordered.  She has not yet received in the mail.  Blood pressure goal less than 130/80.   Low threshold to add a beta blocker if her BP is not at goal on lisinopril given that she has a small ascending aorta aneurysm.

## 2021-11-18 DIAGNOSIS — M545 Low back pain, unspecified: Secondary | ICD-10-CM | POA: Diagnosis not present

## 2021-11-18 DIAGNOSIS — M546 Pain in thoracic spine: Secondary | ICD-10-CM | POA: Diagnosis not present

## 2021-11-18 DIAGNOSIS — M6259 Muscle wasting and atrophy, not elsewhere classified, multiple sites: Secondary | ICD-10-CM | POA: Diagnosis not present

## 2021-11-18 DIAGNOSIS — M419 Scoliosis, unspecified: Secondary | ICD-10-CM | POA: Diagnosis not present

## 2021-11-18 DIAGNOSIS — R293 Abnormal posture: Secondary | ICD-10-CM | POA: Diagnosis not present

## 2021-11-18 DIAGNOSIS — R262 Difficulty in walking, not elsewhere classified: Secondary | ICD-10-CM | POA: Diagnosis not present

## 2021-11-20 DIAGNOSIS — M6259 Muscle wasting and atrophy, not elsewhere classified, multiple sites: Secondary | ICD-10-CM | POA: Diagnosis not present

## 2021-11-20 DIAGNOSIS — M419 Scoliosis, unspecified: Secondary | ICD-10-CM | POA: Diagnosis not present

## 2021-11-20 DIAGNOSIS — R293 Abnormal posture: Secondary | ICD-10-CM | POA: Diagnosis not present

## 2021-11-20 DIAGNOSIS — M546 Pain in thoracic spine: Secondary | ICD-10-CM | POA: Diagnosis not present

## 2021-11-20 DIAGNOSIS — M545 Low back pain, unspecified: Secondary | ICD-10-CM | POA: Diagnosis not present

## 2021-11-20 DIAGNOSIS — R262 Difficulty in walking, not elsewhere classified: Secondary | ICD-10-CM | POA: Diagnosis not present

## 2021-11-21 ENCOUNTER — Ambulatory Visit
Admission: RE | Admit: 2021-11-21 | Discharge: 2021-11-21 | Disposition: A | Discharge status: Home / Self Care | Payer: Medicare Other | Source: Ambulatory Visit | Attending: Family Medicine | Admitting: Family Medicine

## 2021-11-21 DIAGNOSIS — E119 Type 2 diabetes mellitus without complications: Secondary | ICD-10-CM | POA: Diagnosis not present

## 2021-11-21 DIAGNOSIS — G459 Transient cerebral ischemic attack, unspecified: Secondary | ICD-10-CM

## 2021-11-21 DIAGNOSIS — I10 Essential (primary) hypertension: Secondary | ICD-10-CM | POA: Diagnosis not present

## 2021-11-21 DIAGNOSIS — I6523 Occlusion and stenosis of bilateral carotid arteries: Secondary | ICD-10-CM | POA: Diagnosis not present

## 2021-11-21 DIAGNOSIS — E782 Mixed hyperlipidemia: Secondary | ICD-10-CM | POA: Diagnosis not present

## 2021-11-25 DIAGNOSIS — R293 Abnormal posture: Secondary | ICD-10-CM | POA: Diagnosis not present

## 2021-11-25 DIAGNOSIS — M545 Low back pain, unspecified: Secondary | ICD-10-CM | POA: Diagnosis not present

## 2021-11-25 DIAGNOSIS — M6259 Muscle wasting and atrophy, not elsewhere classified, multiple sites: Secondary | ICD-10-CM | POA: Diagnosis not present

## 2021-11-25 DIAGNOSIS — M546 Pain in thoracic spine: Secondary | ICD-10-CM | POA: Diagnosis not present

## 2021-11-25 DIAGNOSIS — R262 Difficulty in walking, not elsewhere classified: Secondary | ICD-10-CM | POA: Diagnosis not present

## 2021-11-25 DIAGNOSIS — M419 Scoliosis, unspecified: Secondary | ICD-10-CM | POA: Diagnosis not present

## 2021-11-26 DIAGNOSIS — R262 Difficulty in walking, not elsewhere classified: Secondary | ICD-10-CM | POA: Diagnosis not present

## 2021-11-26 DIAGNOSIS — M545 Low back pain, unspecified: Secondary | ICD-10-CM | POA: Diagnosis not present

## 2021-11-26 DIAGNOSIS — M6259 Muscle wasting and atrophy, not elsewhere classified, multiple sites: Secondary | ICD-10-CM | POA: Diagnosis not present

## 2021-11-26 DIAGNOSIS — M419 Scoliosis, unspecified: Secondary | ICD-10-CM | POA: Diagnosis not present

## 2021-11-26 DIAGNOSIS — R293 Abnormal posture: Secondary | ICD-10-CM | POA: Diagnosis not present

## 2021-11-26 DIAGNOSIS — M546 Pain in thoracic spine: Secondary | ICD-10-CM | POA: Diagnosis not present

## 2021-11-28 ENCOUNTER — Other Ambulatory Visit (HOSPITAL_BASED_OUTPATIENT_CLINIC_OR_DEPARTMENT_OTHER): Payer: Medicare Other

## 2021-12-01 ENCOUNTER — Ambulatory Visit
Admission: RE | Admit: 2021-12-01 | Discharge: 2021-12-01 | Disposition: A | Discharge status: Home / Self Care | Payer: Medicare Other | Source: Ambulatory Visit | Attending: Family Medicine | Admitting: Family Medicine

## 2021-12-01 DIAGNOSIS — R519 Headache, unspecified: Secondary | ICD-10-CM | POA: Diagnosis not present

## 2021-12-01 DIAGNOSIS — H5711 Ocular pain, right eye: Secondary | ICD-10-CM | POA: Diagnosis not present

## 2021-12-01 DIAGNOSIS — G459 Transient cerebral ischemic attack, unspecified: Secondary | ICD-10-CM

## 2021-12-02 ENCOUNTER — Other Ambulatory Visit: Payer: Self-pay | Admitting: *Deleted

## 2021-12-02 DIAGNOSIS — Z87891 Personal history of nicotine dependence: Secondary | ICD-10-CM

## 2021-12-03 DIAGNOSIS — M419 Scoliosis, unspecified: Secondary | ICD-10-CM | POA: Diagnosis not present

## 2021-12-03 DIAGNOSIS — R293 Abnormal posture: Secondary | ICD-10-CM | POA: Diagnosis not present

## 2021-12-03 DIAGNOSIS — R262 Difficulty in walking, not elsewhere classified: Secondary | ICD-10-CM | POA: Diagnosis not present

## 2021-12-03 DIAGNOSIS — M6259 Muscle wasting and atrophy, not elsewhere classified, multiple sites: Secondary | ICD-10-CM | POA: Diagnosis not present

## 2021-12-03 DIAGNOSIS — M546 Pain in thoracic spine: Secondary | ICD-10-CM | POA: Diagnosis not present

## 2021-12-03 DIAGNOSIS — M545 Low back pain, unspecified: Secondary | ICD-10-CM | POA: Diagnosis not present

## 2021-12-05 DIAGNOSIS — M419 Scoliosis, unspecified: Secondary | ICD-10-CM | POA: Diagnosis not present

## 2021-12-05 DIAGNOSIS — M545 Low back pain, unspecified: Secondary | ICD-10-CM | POA: Diagnosis not present

## 2021-12-05 DIAGNOSIS — M546 Pain in thoracic spine: Secondary | ICD-10-CM | POA: Diagnosis not present

## 2021-12-05 DIAGNOSIS — R293 Abnormal posture: Secondary | ICD-10-CM | POA: Diagnosis not present

## 2021-12-05 DIAGNOSIS — M6259 Muscle wasting and atrophy, not elsewhere classified, multiple sites: Secondary | ICD-10-CM | POA: Diagnosis not present

## 2021-12-05 DIAGNOSIS — R262 Difficulty in walking, not elsewhere classified: Secondary | ICD-10-CM | POA: Diagnosis not present

## 2021-12-13 ENCOUNTER — Other Ambulatory Visit: Payer: Self-pay

## 2021-12-13 ENCOUNTER — Ambulatory Visit (INDEPENDENT_AMBULATORY_CARE_PROVIDER_SITE_OTHER): Payer: Medicare Other

## 2021-12-13 DIAGNOSIS — I7121 Aneurysm of the ascending aorta, without rupture: Secondary | ICD-10-CM | POA: Diagnosis not present

## 2021-12-13 DIAGNOSIS — I10 Essential (primary) hypertension: Secondary | ICD-10-CM | POA: Diagnosis not present

## 2021-12-13 LAB — ECHOCARDIOGRAM COMPLETE
AR max vel: 1.76 cm2
AV Area VTI: 2.03 cm2
AV Area mean vel: 1.8 cm2
AV Mean grad: 5 mmHg
AV Peak grad: 9.6 mmHg
Ao pk vel: 1.55 m/s
Area-P 1/2: 2.19 cm2
Calc EF: 62.3 %
S' Lateral: 2.26 cm
Single Plane A2C EF: 61.8 %
Single Plane A4C EF: 65.2 %

## 2021-12-24 DIAGNOSIS — R293 Abnormal posture: Secondary | ICD-10-CM | POA: Diagnosis not present

## 2021-12-24 DIAGNOSIS — M546 Pain in thoracic spine: Secondary | ICD-10-CM | POA: Diagnosis not present

## 2021-12-24 DIAGNOSIS — M6259 Muscle wasting and atrophy, not elsewhere classified, multiple sites: Secondary | ICD-10-CM | POA: Diagnosis not present

## 2021-12-24 DIAGNOSIS — M419 Scoliosis, unspecified: Secondary | ICD-10-CM | POA: Diagnosis not present

## 2021-12-24 DIAGNOSIS — M545 Low back pain, unspecified: Secondary | ICD-10-CM | POA: Diagnosis not present

## 2021-12-24 DIAGNOSIS — R262 Difficulty in walking, not elsewhere classified: Secondary | ICD-10-CM | POA: Diagnosis not present

## 2021-12-26 DIAGNOSIS — M545 Low back pain, unspecified: Secondary | ICD-10-CM | POA: Diagnosis not present

## 2021-12-26 DIAGNOSIS — M419 Scoliosis, unspecified: Secondary | ICD-10-CM | POA: Diagnosis not present

## 2021-12-26 DIAGNOSIS — R293 Abnormal posture: Secondary | ICD-10-CM | POA: Diagnosis not present

## 2021-12-26 DIAGNOSIS — M6259 Muscle wasting and atrophy, not elsewhere classified, multiple sites: Secondary | ICD-10-CM | POA: Diagnosis not present

## 2021-12-26 DIAGNOSIS — M546 Pain in thoracic spine: Secondary | ICD-10-CM | POA: Diagnosis not present

## 2021-12-26 DIAGNOSIS — R262 Difficulty in walking, not elsewhere classified: Secondary | ICD-10-CM | POA: Diagnosis not present

## 2022-01-06 DIAGNOSIS — E1151 Type 2 diabetes mellitus with diabetic peripheral angiopathy without gangrene: Secondary | ICD-10-CM | POA: Diagnosis not present

## 2022-01-06 DIAGNOSIS — L84 Corns and callosities: Secondary | ICD-10-CM | POA: Diagnosis not present

## 2022-01-06 DIAGNOSIS — M19071 Primary osteoarthritis, right ankle and foot: Secondary | ICD-10-CM | POA: Diagnosis not present

## 2022-01-06 DIAGNOSIS — B351 Tinea unguium: Secondary | ICD-10-CM | POA: Diagnosis not present

## 2022-01-06 DIAGNOSIS — M19072 Primary osteoarthritis, left ankle and foot: Secondary | ICD-10-CM | POA: Diagnosis not present

## 2022-02-10 DIAGNOSIS — I10 Essential (primary) hypertension: Secondary | ICD-10-CM | POA: Diagnosis not present

## 2022-02-10 DIAGNOSIS — H02403 Unspecified ptosis of bilateral eyelids: Secondary | ICD-10-CM | POA: Diagnosis not present

## 2022-02-10 DIAGNOSIS — H401131 Primary open-angle glaucoma, bilateral, mild stage: Secondary | ICD-10-CM | POA: Diagnosis not present

## 2022-02-10 DIAGNOSIS — H04123 Dry eye syndrome of bilateral lacrimal glands: Secondary | ICD-10-CM | POA: Diagnosis not present

## 2022-03-17 DIAGNOSIS — M19071 Primary osteoarthritis, right ankle and foot: Secondary | ICD-10-CM | POA: Diagnosis not present

## 2022-03-17 DIAGNOSIS — M19072 Primary osteoarthritis, left ankle and foot: Secondary | ICD-10-CM | POA: Diagnosis not present

## 2022-03-17 DIAGNOSIS — B351 Tinea unguium: Secondary | ICD-10-CM | POA: Diagnosis not present

## 2022-03-17 DIAGNOSIS — L84 Corns and callosities: Secondary | ICD-10-CM | POA: Diagnosis not present

## 2022-03-17 DIAGNOSIS — E1151 Type 2 diabetes mellitus with diabetic peripheral angiopathy without gangrene: Secondary | ICD-10-CM | POA: Diagnosis not present

## 2022-05-14 DIAGNOSIS — J449 Chronic obstructive pulmonary disease, unspecified: Secondary | ICD-10-CM | POA: Diagnosis not present

## 2022-05-14 DIAGNOSIS — I2510 Atherosclerotic heart disease of native coronary artery without angina pectoris: Secondary | ICD-10-CM | POA: Diagnosis not present

## 2022-05-14 DIAGNOSIS — E785 Hyperlipidemia, unspecified: Secondary | ICD-10-CM | POA: Diagnosis not present

## 2022-05-14 DIAGNOSIS — R21 Rash and other nonspecific skin eruption: Secondary | ICD-10-CM | POA: Diagnosis not present

## 2022-05-14 DIAGNOSIS — E1140 Type 2 diabetes mellitus with diabetic neuropathy, unspecified: Secondary | ICD-10-CM | POA: Diagnosis not present

## 2022-05-14 DIAGNOSIS — N183 Chronic kidney disease, stage 3 unspecified: Secondary | ICD-10-CM | POA: Diagnosis not present

## 2022-05-14 DIAGNOSIS — F17200 Nicotine dependence, unspecified, uncomplicated: Secondary | ICD-10-CM | POA: Diagnosis not present

## 2022-05-14 DIAGNOSIS — I700 Atherosclerosis of aorta: Secondary | ICD-10-CM | POA: Diagnosis not present

## 2022-05-14 DIAGNOSIS — I129 Hypertensive chronic kidney disease with stage 1 through stage 4 chronic kidney disease, or unspecified chronic kidney disease: Secondary | ICD-10-CM | POA: Diagnosis not present

## 2022-06-24 DIAGNOSIS — L84 Corns and callosities: Secondary | ICD-10-CM | POA: Diagnosis not present

## 2022-06-24 DIAGNOSIS — B351 Tinea unguium: Secondary | ICD-10-CM | POA: Diagnosis not present

## 2022-06-24 DIAGNOSIS — E1151 Type 2 diabetes mellitus with diabetic peripheral angiopathy without gangrene: Secondary | ICD-10-CM | POA: Diagnosis not present

## 2022-07-07 DIAGNOSIS — I2510 Atherosclerotic heart disease of native coronary artery without angina pectoris: Secondary | ICD-10-CM | POA: Diagnosis not present

## 2022-07-07 DIAGNOSIS — I700 Atherosclerosis of aorta: Secondary | ICD-10-CM | POA: Diagnosis not present

## 2022-07-07 DIAGNOSIS — N183 Chronic kidney disease, stage 3 unspecified: Secondary | ICD-10-CM | POA: Diagnosis not present

## 2022-07-07 DIAGNOSIS — J449 Chronic obstructive pulmonary disease, unspecified: Secondary | ICD-10-CM | POA: Diagnosis not present

## 2022-07-07 DIAGNOSIS — E785 Hyperlipidemia, unspecified: Secondary | ICD-10-CM | POA: Diagnosis not present

## 2022-07-07 DIAGNOSIS — E1140 Type 2 diabetes mellitus with diabetic neuropathy, unspecified: Secondary | ICD-10-CM | POA: Diagnosis not present

## 2022-07-15 DIAGNOSIS — M19072 Primary osteoarthritis, left ankle and foot: Secondary | ICD-10-CM | POA: Diagnosis not present

## 2022-07-15 DIAGNOSIS — E1151 Type 2 diabetes mellitus with diabetic peripheral angiopathy without gangrene: Secondary | ICD-10-CM | POA: Diagnosis not present

## 2022-07-15 DIAGNOSIS — M19071 Primary osteoarthritis, right ankle and foot: Secondary | ICD-10-CM | POA: Diagnosis not present

## 2022-07-15 DIAGNOSIS — R2681 Unsteadiness on feet: Secondary | ICD-10-CM | POA: Diagnosis not present

## 2022-08-05 DIAGNOSIS — N816 Rectocele: Secondary | ICD-10-CM | POA: Diagnosis not present

## 2022-08-05 DIAGNOSIS — N3941 Urge incontinence: Secondary | ICD-10-CM | POA: Diagnosis not present

## 2022-08-05 DIAGNOSIS — C670 Malignant neoplasm of trigone of bladder: Secondary | ICD-10-CM | POA: Diagnosis not present

## 2022-08-05 DIAGNOSIS — N8111 Cystocele, midline: Secondary | ICD-10-CM | POA: Diagnosis not present

## 2022-09-02 DIAGNOSIS — M19072 Primary osteoarthritis, left ankle and foot: Secondary | ICD-10-CM | POA: Diagnosis not present

## 2022-09-02 DIAGNOSIS — E1151 Type 2 diabetes mellitus with diabetic peripheral angiopathy without gangrene: Secondary | ICD-10-CM | POA: Diagnosis not present

## 2022-09-02 DIAGNOSIS — M19071 Primary osteoarthritis, right ankle and foot: Secondary | ICD-10-CM | POA: Diagnosis not present

## 2022-09-02 DIAGNOSIS — L84 Corns and callosities: Secondary | ICD-10-CM | POA: Diagnosis not present

## 2022-09-02 DIAGNOSIS — B351 Tinea unguium: Secondary | ICD-10-CM | POA: Diagnosis not present

## 2022-09-25 ENCOUNTER — Other Ambulatory Visit: Payer: Self-pay | Admitting: Family Medicine

## 2022-09-25 DIAGNOSIS — Z1231 Encounter for screening mammogram for malignant neoplasm of breast: Secondary | ICD-10-CM

## 2022-09-29 DIAGNOSIS — E1140 Type 2 diabetes mellitus with diabetic neuropathy, unspecified: Secondary | ICD-10-CM | POA: Diagnosis not present

## 2022-09-29 DIAGNOSIS — M7989 Other specified soft tissue disorders: Secondary | ICD-10-CM | POA: Diagnosis not present

## 2022-11-04 ENCOUNTER — Ambulatory Visit
Admission: RE | Admit: 2022-11-04 | Discharge: 2022-11-04 | Disposition: A | Discharge status: Home / Self Care | Payer: Medicare Other | Source: Ambulatory Visit | Attending: Family Medicine | Admitting: Family Medicine

## 2022-11-04 DIAGNOSIS — Z1231 Encounter for screening mammogram for malignant neoplasm of breast: Secondary | ICD-10-CM | POA: Diagnosis not present

## 2022-11-17 DIAGNOSIS — E1151 Type 2 diabetes mellitus with diabetic peripheral angiopathy without gangrene: Secondary | ICD-10-CM | POA: Diagnosis not present

## 2022-11-17 DIAGNOSIS — B351 Tinea unguium: Secondary | ICD-10-CM | POA: Diagnosis not present

## 2022-11-17 DIAGNOSIS — M19072 Primary osteoarthritis, left ankle and foot: Secondary | ICD-10-CM | POA: Diagnosis not present

## 2022-11-17 DIAGNOSIS — L84 Corns and callosities: Secondary | ICD-10-CM | POA: Diagnosis not present

## 2022-11-17 DIAGNOSIS — M19071 Primary osteoarthritis, right ankle and foot: Secondary | ICD-10-CM | POA: Diagnosis not present

## 2022-11-24 DIAGNOSIS — M545 Low back pain, unspecified: Secondary | ICD-10-CM | POA: Diagnosis not present

## 2022-11-24 DIAGNOSIS — M79671 Pain in right foot: Secondary | ICD-10-CM | POA: Diagnosis not present

## 2022-11-24 DIAGNOSIS — M533 Sacrococcygeal disorders, not elsewhere classified: Secondary | ICD-10-CM | POA: Diagnosis not present

## 2022-11-24 DIAGNOSIS — Z6829 Body mass index (BMI) 29.0-29.9, adult: Secondary | ICD-10-CM | POA: Diagnosis not present

## 2022-11-24 DIAGNOSIS — R768 Other specified abnormal immunological findings in serum: Secondary | ICD-10-CM | POA: Diagnosis not present

## 2022-11-24 DIAGNOSIS — E663 Overweight: Secondary | ICD-10-CM | POA: Diagnosis not present

## 2022-11-24 DIAGNOSIS — M79641 Pain in right hand: Secondary | ICD-10-CM | POA: Diagnosis not present

## 2022-11-24 DIAGNOSIS — M79642 Pain in left hand: Secondary | ICD-10-CM | POA: Diagnosis not present

## 2022-11-24 DIAGNOSIS — M79672 Pain in left foot: Secondary | ICD-10-CM | POA: Diagnosis not present

## 2022-11-27 DIAGNOSIS — Z23 Encounter for immunization: Secondary | ICD-10-CM | POA: Diagnosis not present

## 2022-11-27 DIAGNOSIS — N183 Chronic kidney disease, stage 3 unspecified: Secondary | ICD-10-CM | POA: Diagnosis not present

## 2022-11-27 DIAGNOSIS — F17200 Nicotine dependence, unspecified, uncomplicated: Secondary | ICD-10-CM | POA: Diagnosis not present

## 2022-11-27 DIAGNOSIS — J449 Chronic obstructive pulmonary disease, unspecified: Secondary | ICD-10-CM | POA: Diagnosis not present

## 2022-11-27 DIAGNOSIS — M810 Age-related osteoporosis without current pathological fracture: Secondary | ICD-10-CM | POA: Diagnosis not present

## 2022-11-27 DIAGNOSIS — I2510 Atherosclerotic heart disease of native coronary artery without angina pectoris: Secondary | ICD-10-CM | POA: Diagnosis not present

## 2022-11-27 DIAGNOSIS — I700 Atherosclerosis of aorta: Secondary | ICD-10-CM | POA: Diagnosis not present

## 2022-11-27 DIAGNOSIS — Z0000 Encounter for general adult medical examination without abnormal findings: Secondary | ICD-10-CM | POA: Diagnosis not present

## 2022-11-27 DIAGNOSIS — E785 Hyperlipidemia, unspecified: Secondary | ICD-10-CM | POA: Diagnosis not present

## 2022-11-27 DIAGNOSIS — R8290 Unspecified abnormal findings in urine: Secondary | ICD-10-CM | POA: Diagnosis not present

## 2022-11-27 DIAGNOSIS — I129 Hypertensive chronic kidney disease with stage 1 through stage 4 chronic kidney disease, or unspecified chronic kidney disease: Secondary | ICD-10-CM | POA: Diagnosis not present

## 2022-11-27 DIAGNOSIS — E1140 Type 2 diabetes mellitus with diabetic neuropathy, unspecified: Secondary | ICD-10-CM | POA: Diagnosis not present

## 2022-12-19 DIAGNOSIS — M791 Myalgia, unspecified site: Secondary | ICD-10-CM | POA: Diagnosis not present

## 2022-12-19 DIAGNOSIS — M47812 Spondylosis without myelopathy or radiculopathy, cervical region: Secondary | ICD-10-CM | POA: Diagnosis not present

## 2023-01-02 DIAGNOSIS — M545 Low back pain, unspecified: Secondary | ICD-10-CM | POA: Diagnosis not present

## 2023-01-02 DIAGNOSIS — M79641 Pain in right hand: Secondary | ICD-10-CM | POA: Diagnosis not present

## 2023-01-02 DIAGNOSIS — M79671 Pain in right foot: Secondary | ICD-10-CM | POA: Diagnosis not present

## 2023-01-02 DIAGNOSIS — M79642 Pain in left hand: Secondary | ICD-10-CM | POA: Diagnosis not present

## 2023-01-02 DIAGNOSIS — R768 Other specified abnormal immunological findings in serum: Secondary | ICD-10-CM | POA: Diagnosis not present

## 2023-01-02 DIAGNOSIS — Z6829 Body mass index (BMI) 29.0-29.9, adult: Secondary | ICD-10-CM | POA: Diagnosis not present

## 2023-01-02 DIAGNOSIS — M79672 Pain in left foot: Secondary | ICD-10-CM | POA: Diagnosis not present

## 2023-01-02 DIAGNOSIS — E663 Overweight: Secondary | ICD-10-CM | POA: Diagnosis not present

## 2023-01-09 DIAGNOSIS — M47812 Spondylosis without myelopathy or radiculopathy, cervical region: Secondary | ICD-10-CM | POA: Diagnosis not present

## 2023-01-15 DIAGNOSIS — M79641 Pain in right hand: Secondary | ICD-10-CM | POA: Diagnosis not present

## 2023-01-15 DIAGNOSIS — M6740 Ganglion, unspecified site: Secondary | ICD-10-CM | POA: Diagnosis not present

## 2023-01-20 DIAGNOSIS — E1151 Type 2 diabetes mellitus with diabetic peripheral angiopathy without gangrene: Secondary | ICD-10-CM | POA: Diagnosis not present

## 2023-01-20 DIAGNOSIS — L84 Corns and callosities: Secondary | ICD-10-CM | POA: Diagnosis not present

## 2023-01-20 DIAGNOSIS — M19072 Primary osteoarthritis, left ankle and foot: Secondary | ICD-10-CM | POA: Diagnosis not present

## 2023-01-20 DIAGNOSIS — M19071 Primary osteoarthritis, right ankle and foot: Secondary | ICD-10-CM | POA: Diagnosis not present

## 2023-01-20 DIAGNOSIS — B351 Tinea unguium: Secondary | ICD-10-CM | POA: Diagnosis not present

## 2023-01-26 DIAGNOSIS — N8111 Cystocele, midline: Secondary | ICD-10-CM | POA: Diagnosis not present

## 2023-01-26 DIAGNOSIS — C670 Malignant neoplasm of trigone of bladder: Secondary | ICD-10-CM | POA: Diagnosis not present

## 2023-01-26 DIAGNOSIS — R81 Glycosuria: Secondary | ICD-10-CM | POA: Diagnosis not present

## 2023-01-26 DIAGNOSIS — N3941 Urge incontinence: Secondary | ICD-10-CM | POA: Diagnosis not present

## 2023-01-27 ENCOUNTER — Telehealth: Payer: Self-pay

## 2023-01-27 NOTE — Telephone Encounter (Signed)
Received request for annual LDCT from [facility] (Dr. Deland Pretty).  Patient is age 82 years and medicare will not cover the LDCT. Consulted with Vella Kohler, [facility] since patient also has [employer].  Patient is not approved due to Medicare guidelines over age 94 years. Faxed notice to [facility] for update.

## 2023-02-05 DIAGNOSIS — N3941 Urge incontinence: Secondary | ICD-10-CM | POA: Diagnosis not present

## 2023-02-12 DIAGNOSIS — N3941 Urge incontinence: Secondary | ICD-10-CM | POA: Diagnosis not present

## 2023-02-19 DIAGNOSIS — N3941 Urge incontinence: Secondary | ICD-10-CM | POA: Diagnosis not present

## 2023-02-20 ENCOUNTER — Other Ambulatory Visit: Payer: Self-pay

## 2023-02-20 DIAGNOSIS — M67441 Ganglion, right hand: Secondary | ICD-10-CM | POA: Diagnosis not present

## 2023-02-20 DIAGNOSIS — M19041 Primary osteoarthritis, right hand: Secondary | ICD-10-CM | POA: Diagnosis not present

## 2023-02-20 DIAGNOSIS — M71341 Other bursal cyst, right hand: Secondary | ICD-10-CM | POA: Diagnosis not present

## 2023-02-24 DIAGNOSIS — M79641 Pain in right hand: Secondary | ICD-10-CM | POA: Diagnosis not present

## 2023-02-26 DIAGNOSIS — N3941 Urge incontinence: Secondary | ICD-10-CM | POA: Diagnosis not present

## 2023-03-02 DIAGNOSIS — M67441 Ganglion, right hand: Secondary | ICD-10-CM | POA: Diagnosis not present

## 2023-03-05 DIAGNOSIS — N3941 Urge incontinence: Secondary | ICD-10-CM | POA: Diagnosis not present

## 2023-03-09 DIAGNOSIS — H04123 Dry eye syndrome of bilateral lacrimal glands: Secondary | ICD-10-CM | POA: Diagnosis not present

## 2023-03-09 DIAGNOSIS — H02403 Unspecified ptosis of bilateral eyelids: Secondary | ICD-10-CM | POA: Diagnosis not present

## 2023-03-09 DIAGNOSIS — H401131 Primary open-angle glaucoma, bilateral, mild stage: Secondary | ICD-10-CM | POA: Diagnosis not present

## 2023-03-09 DIAGNOSIS — I10 Essential (primary) hypertension: Secondary | ICD-10-CM | POA: Diagnosis not present

## 2023-03-12 DIAGNOSIS — N3941 Urge incontinence: Secondary | ICD-10-CM | POA: Diagnosis not present

## 2023-03-12 DIAGNOSIS — N8111 Cystocele, midline: Secondary | ICD-10-CM | POA: Diagnosis not present

## 2023-03-12 DIAGNOSIS — C670 Malignant neoplasm of trigone of bladder: Secondary | ICD-10-CM | POA: Diagnosis not present

## 2023-03-19 DIAGNOSIS — N3941 Urge incontinence: Secondary | ICD-10-CM | POA: Diagnosis not present

## 2023-03-25 DIAGNOSIS — B351 Tinea unguium: Secondary | ICD-10-CM | POA: Diagnosis not present

## 2023-03-25 DIAGNOSIS — E1151 Type 2 diabetes mellitus with diabetic peripheral angiopathy without gangrene: Secondary | ICD-10-CM | POA: Diagnosis not present

## 2023-03-25 DIAGNOSIS — L84 Corns and callosities: Secondary | ICD-10-CM | POA: Diagnosis not present

## 2023-03-25 DIAGNOSIS — M19072 Primary osteoarthritis, left ankle and foot: Secondary | ICD-10-CM | POA: Diagnosis not present

## 2023-03-25 DIAGNOSIS — M19071 Primary osteoarthritis, right ankle and foot: Secondary | ICD-10-CM | POA: Diagnosis not present

## 2023-03-26 DIAGNOSIS — N3941 Urge incontinence: Secondary | ICD-10-CM | POA: Diagnosis not present

## 2023-04-02 DIAGNOSIS — N3941 Urge incontinence: Secondary | ICD-10-CM | POA: Diagnosis not present

## 2023-04-09 DIAGNOSIS — N3941 Urge incontinence: Secondary | ICD-10-CM | POA: Diagnosis not present

## 2023-04-16 DIAGNOSIS — N3941 Urge incontinence: Secondary | ICD-10-CM | POA: Diagnosis not present

## 2023-04-23 DIAGNOSIS — N8111 Cystocele, midline: Secondary | ICD-10-CM | POA: Diagnosis not present

## 2023-04-23 DIAGNOSIS — N3941 Urge incontinence: Secondary | ICD-10-CM | POA: Diagnosis not present

## 2023-04-28 DIAGNOSIS — M5440 Lumbago with sciatica, unspecified side: Secondary | ICD-10-CM | POA: Diagnosis not present

## 2023-05-05 DIAGNOSIS — M5136 Other intervertebral disc degeneration, lumbar region: Secondary | ICD-10-CM | POA: Diagnosis not present

## 2023-05-05 DIAGNOSIS — M5410 Radiculopathy, site unspecified: Secondary | ICD-10-CM | POA: Diagnosis not present

## 2023-05-06 ENCOUNTER — Other Ambulatory Visit: Payer: Self-pay | Admitting: Family Medicine

## 2023-05-06 DIAGNOSIS — M5136 Other intervertebral disc degeneration, lumbar region: Secondary | ICD-10-CM

## 2023-05-14 DIAGNOSIS — N3941 Urge incontinence: Secondary | ICD-10-CM | POA: Diagnosis not present

## 2023-05-14 DIAGNOSIS — R81 Glycosuria: Secondary | ICD-10-CM | POA: Diagnosis not present

## 2023-05-15 ENCOUNTER — Ambulatory Visit
Admission: RE | Admit: 2023-05-15 | Discharge: 2023-05-15 | Disposition: A | Discharge status: Home / Self Care | Payer: Medicare Other | Source: Ambulatory Visit | Attending: Family Medicine | Admitting: Family Medicine

## 2023-05-15 DIAGNOSIS — M5136 Other intervertebral disc degeneration, lumbar region: Secondary | ICD-10-CM

## 2023-05-15 DIAGNOSIS — M4316 Spondylolisthesis, lumbar region: Secondary | ICD-10-CM | POA: Diagnosis not present

## 2023-05-15 DIAGNOSIS — M48061 Spinal stenosis, lumbar region without neurogenic claudication: Secondary | ICD-10-CM | POA: Diagnosis not present

## 2023-05-18 DIAGNOSIS — M5416 Radiculopathy, lumbar region: Secondary | ICD-10-CM | POA: Diagnosis not present

## 2023-05-29 DIAGNOSIS — E785 Hyperlipidemia, unspecified: Secondary | ICD-10-CM | POA: Diagnosis not present

## 2023-05-29 DIAGNOSIS — M5136 Other intervertebral disc degeneration, lumbar region: Secondary | ICD-10-CM | POA: Diagnosis not present

## 2023-05-29 DIAGNOSIS — I700 Atherosclerosis of aorta: Secondary | ICD-10-CM | POA: Diagnosis not present

## 2023-05-29 DIAGNOSIS — I2510 Atherosclerotic heart disease of native coronary artery without angina pectoris: Secondary | ICD-10-CM | POA: Diagnosis not present

## 2023-05-29 DIAGNOSIS — J449 Chronic obstructive pulmonary disease, unspecified: Secondary | ICD-10-CM | POA: Diagnosis not present

## 2023-05-29 DIAGNOSIS — I129 Hypertensive chronic kidney disease with stage 1 through stage 4 chronic kidney disease, or unspecified chronic kidney disease: Secondary | ICD-10-CM | POA: Diagnosis not present

## 2023-05-29 DIAGNOSIS — N183 Chronic kidney disease, stage 3 unspecified: Secondary | ICD-10-CM | POA: Diagnosis not present

## 2023-05-29 DIAGNOSIS — F17200 Nicotine dependence, unspecified, uncomplicated: Secondary | ICD-10-CM | POA: Diagnosis not present

## 2023-05-29 DIAGNOSIS — N3281 Overactive bladder: Secondary | ICD-10-CM | POA: Diagnosis not present

## 2023-05-29 DIAGNOSIS — E1140 Type 2 diabetes mellitus with diabetic neuropathy, unspecified: Secondary | ICD-10-CM | POA: Diagnosis not present

## 2023-06-03 DIAGNOSIS — M5416 Radiculopathy, lumbar region: Secondary | ICD-10-CM | POA: Diagnosis not present

## 2023-06-23 DIAGNOSIS — M1611 Unilateral primary osteoarthritis, right hip: Secondary | ICD-10-CM | POA: Diagnosis not present

## 2023-07-02 DIAGNOSIS — N3946 Mixed incontinence: Secondary | ICD-10-CM | POA: Diagnosis not present

## 2023-07-02 DIAGNOSIS — R350 Frequency of micturition: Secondary | ICD-10-CM | POA: Diagnosis not present

## 2023-07-07 DIAGNOSIS — M1611 Unilateral primary osteoarthritis, right hip: Secondary | ICD-10-CM | POA: Diagnosis not present

## 2023-07-20 DIAGNOSIS — M1611 Unilateral primary osteoarthritis, right hip: Secondary | ICD-10-CM | POA: Diagnosis not present

## 2023-07-23 DIAGNOSIS — M25551 Pain in right hip: Secondary | ICD-10-CM | POA: Diagnosis not present

## 2023-07-28 ENCOUNTER — Ambulatory Visit (HOSPITAL_COMMUNITY)
Admission: RE | Admit: 2023-07-28 | Discharge: 2023-07-28 | Disposition: A | Discharge status: Home / Self Care | Payer: Medicare Other | Source: Ambulatory Visit | Attending: Cardiovascular Disease | Admitting: Cardiovascular Disease

## 2023-07-28 ENCOUNTER — Other Ambulatory Visit (HOSPITAL_COMMUNITY): Payer: Self-pay | Admitting: Family Medicine

## 2023-07-28 ENCOUNTER — Encounter (HOSPITAL_COMMUNITY): Payer: Self-pay

## 2023-07-28 DIAGNOSIS — F17200 Nicotine dependence, unspecified, uncomplicated: Secondary | ICD-10-CM | POA: Diagnosis not present

## 2023-07-28 DIAGNOSIS — M7989 Other specified soft tissue disorders: Secondary | ICD-10-CM

## 2023-07-28 NOTE — Progress Notes (Signed)
Left lower extremity venous duplex exam performed. See results under Chart Review: CV proc.  Preliminary results show evidence of extensive DVT in the left lower extremity. Results called to [facility], spoke with Dr. Wynelle Link who instructed patient to take the Eliquis samples given to her at office visit. Final report to follow.

## 2023-07-29 ENCOUNTER — Telehealth: Payer: Self-pay | Admitting: Cardiovascular Disease

## 2023-07-29 DIAGNOSIS — N3946 Mixed incontinence: Secondary | ICD-10-CM | POA: Diagnosis not present

## 2023-07-29 NOTE — Telephone Encounter (Signed)
   Pre-operative Risk Assessment    Patient Name: [name]  DOB: 1941/06/13 MRN: [id number]      Request for Surgical Clearance    Procedure:   Right hip replacement  Date of Surgery:  Clearance TBD                                 Surgeon:  Dr. Gean Birchwood Surgeon's Group or Practice Name:  [facility] Phone number:  [phone] Fax number:  [phone]   Type of Clearance Requested:   - Medical  - Pharmacy:  Hold patient didn't tell them what medication she is on so they are deferring to cardiologist.       Type of Anesthesia:  Spinal   Additional requests/questions:    Emmaline Kluver   07/29/2023, 10:54 AM

## 2023-07-29 NOTE — Telephone Encounter (Signed)
 1st attempt to reach pt regarding surgical clearance and the need for an IN OFFICE appointment.  Left pt a detailed message to call back and get that scheduled.

## 2023-07-29 NOTE — Telephone Encounter (Signed)
  The patient's husband is returning the call. He mentioned that the procedure with Dr. Turner Daniels will be canceled because the patient has a blood clot in her left leg and was given a blood thinner at the hospital

## 2023-07-29 NOTE — Telephone Encounter (Signed)
   Name: [name]  DOB: 1941-01-14  MRN: [id number]  Primary Cardiologist: Chilton Si, MD  Chart reviewed as part of pre-operative protocol coverage. Because of [name] past medical history and time since last visit, she will require a follow-up in-office visit in order to better assess preoperative cardiovascular risk.  Pre-op covering staff: - Please schedule appointment and call patient to inform them. If patient already had an upcoming appointment within acceptable timeframe, please add "pre-op clearance" to the appointment notes so provider is aware. - Please contact requesting surgeon's office via preferred method (i.e, phone, fax) to inform them of need for appointment prior to surgery.  Patient has not been seen for 2 years.  She is on aspirin for CAD.  At the time of her in person visit, if she is asymptomatic she can hold aspirin x 5 to 7 days prior to procedure.  Please restart medic safe to do so.  Sharlene Dory, PA-C  07/29/2023, 11:07 AM

## 2023-08-05 ENCOUNTER — Other Ambulatory Visit (HOSPITAL_BASED_OUTPATIENT_CLINIC_OR_DEPARTMENT_OTHER): Payer: Self-pay

## 2023-08-05 ENCOUNTER — Emergency Department (HOSPITAL_BASED_OUTPATIENT_CLINIC_OR_DEPARTMENT_OTHER): Payer: Medicare Other

## 2023-08-05 ENCOUNTER — Other Ambulatory Visit: Payer: Self-pay

## 2023-08-05 ENCOUNTER — Emergency Department (HOSPITAL_BASED_OUTPATIENT_CLINIC_OR_DEPARTMENT_OTHER)
Admission: EM | Admit: 2023-08-05 | Discharge: 2023-08-05 | Disposition: A | Discharge status: Home / Self Care | Payer: Medicare Other | Attending: Emergency Medicine | Admitting: Emergency Medicine

## 2023-08-05 ENCOUNTER — Encounter (HOSPITAL_BASED_OUTPATIENT_CLINIC_OR_DEPARTMENT_OTHER): Payer: Self-pay | Admitting: Emergency Medicine

## 2023-08-05 DIAGNOSIS — I7409 Other arterial embolism and thrombosis of abdominal aorta: Secondary | ICD-10-CM | POA: Diagnosis not present

## 2023-08-05 DIAGNOSIS — I82812 Embolism and thrombosis of superficial veins of left lower extremities: Secondary | ICD-10-CM | POA: Diagnosis not present

## 2023-08-05 DIAGNOSIS — I2510 Atherosclerotic heart disease of native coronary artery without angina pectoris: Secondary | ICD-10-CM | POA: Diagnosis not present

## 2023-08-05 DIAGNOSIS — M7989 Other specified soft tissue disorders: Secondary | ICD-10-CM | POA: Diagnosis present

## 2023-08-05 DIAGNOSIS — I10 Essential (primary) hypertension: Secondary | ICD-10-CM | POA: Insufficient documentation

## 2023-08-05 DIAGNOSIS — Z7901 Long term (current) use of anticoagulants: Secondary | ICD-10-CM | POA: Diagnosis not present

## 2023-08-05 DIAGNOSIS — I745 Embolism and thrombosis of iliac artery: Secondary | ICD-10-CM | POA: Diagnosis not present

## 2023-08-05 DIAGNOSIS — Z79899 Other long term (current) drug therapy: Secondary | ICD-10-CM | POA: Insufficient documentation

## 2023-08-05 DIAGNOSIS — Z7982 Long term (current) use of aspirin: Secondary | ICD-10-CM | POA: Diagnosis not present

## 2023-08-05 DIAGNOSIS — I82402 Acute embolism and thrombosis of unspecified deep veins of left lower extremity: Secondary | ICD-10-CM | POA: Diagnosis not present

## 2023-08-05 DIAGNOSIS — R600 Localized edema: Secondary | ICD-10-CM | POA: Diagnosis not present

## 2023-08-05 DIAGNOSIS — I82423 Acute embolism and thrombosis of iliac vein, bilateral: Secondary | ICD-10-CM | POA: Diagnosis not present

## 2023-08-05 DIAGNOSIS — R918 Other nonspecific abnormal finding of lung field: Secondary | ICD-10-CM | POA: Diagnosis not present

## 2023-08-05 LAB — CBC
HCT: 41.8 % (ref 36.0–46.0)
Hemoglobin: 13.9 g/dL (ref 12.0–15.0)
MCH: 30.8 pg (ref 26.0–34.0)
MCHC: 33.3 g/dL (ref 30.0–36.0)
MCV: 92.5 fL (ref 80.0–100.0)
Platelets: 220 10*3/uL (ref 150–400)
RBC: 4.52 MIL/uL (ref 3.87–5.11)
RDW: 15.4 % (ref 11.5–15.5)
WBC: 7.6 10*3/uL (ref 4.0–10.5)
nRBC: 0 % (ref 0.0–0.2)

## 2023-08-05 LAB — BASIC METABOLIC PANEL
Anion gap: 9 (ref 5–15)
BUN: 21 mg/dL (ref 8–23)
CO2: 27 mmol/L (ref 22–32)
Calcium: 9.2 mg/dL (ref 8.9–10.3)
Chloride: 106 mmol/L (ref 98–111)
Creatinine, Ser: 0.79 mg/dL (ref 0.44–1.00)
GFR, Estimated: >60 mL/min (ref >60)
Glucose, Bld: 96 mg/dL (ref 70–99)
Potassium: 4.3 mmol/L (ref 3.5–5.1)
Sodium: 142 mmol/L (ref 135–145)

## 2023-08-05 MED ORDER — IOHEXOL 350 MG/ML SOLN
100.0000 mL | Freq: Once | INTRAVENOUS | Status: AC | PRN
  Administered 2023-08-05: 100 mL via INTRAVENOUS

## 2023-08-05 NOTE — Discharge Instructions (Signed)
In the short-term you can wear compression stocking/hose on the affected extremity to help to pull some of the fluid back into circulation, however you are likely to continue to have some pain and swelling until the clot is removed from the left leg.  Please follow-up for your appointment on Friday where they will likely schedule you for a procedure called a thrombectomy to help remove some of the clot from the leg.  Please return if you begin having severe chest pain, shortness of breath.

## 2023-08-05 NOTE — ED Triage Notes (Signed)
Pt via pov from home with swelling to her left foot; she has documented DVT and has surgery scheduled for 8/23. Swelling increased overnight. Pt went to PCP and was send here. Pt on eliquis x 1 week, has had some blood in her urine. Pt alert & oriented, nad noted.

## 2023-08-05 NOTE — ED Notes (Signed)
Reviewed AVS/discharge instruction with patient. Time allotted for and all questions answered. Patient is agreeable for d/c and escorted to ed exit by staff.  

## 2023-08-05 NOTE — ED Provider Notes (Signed)
[place] EMERGENCY DEPARTMENT AT [facility] Provider Note   CSN: [id number] Arrival date & time: 08/05/23  1347     History  Chief Complaint  Patient presents with   Leg Swelling    [name] is a 82 y.o. female past medical history significant for hypertension, CAD, tobacco abuse, ascending aorta aneurysm, emphysema, was recently diagnosed with left-sided DVT and started on Eliquis 1 Wiggins ago.  Patient reports that the swelling increased overnight, she went to an urgent care and was sent here due to the degree of swelling.  She denies any chest pain, shortness of breath.  She has not missed any doses of her Eliquis.  She also endorses some blood in urine, recently being treated for urinary tract infection with follow-up with urologist tomorrow.  Denies any bleeding from gums, excessive bruising.  HPI     Home Medications Prior to Admission medications   Medication Sig Start Date End Date Taking? Authorizing Provider  aspirin EC 81 MG tablet Take 81 mg by mouth daily.    [provider]  atorvastatin (LIPITOR) 40 MG tablet Take 1 tablet (40 mg total) by mouth daily. 05/16/20 11/15/21  Marjie Skiff E, PA-C  calcium-vitamin D (OSCAL WITH D) 500-200 MG-UNIT tablet Take 1 tablet by mouth.    [provider]  folic acid (FOLVITE) 400 MCG tablet Take 400 mcg by mouth daily.    [provider]  latanoprost (XALATAN) 0.005 % ophthalmic solution Place 1 drop into both eyes at bedtime. 09/05/21   [provider]  lidocaine (LIDODERM) 5 % Place 1 patch onto the skin daily. Remove & Discard patch within 12 hours or as directed by MD 07/24/18   Caccavale, Sophia, PA-C  lisinopril (ZESTRIL) 2.5 MG tablet Take 2.5 mg by mouth daily. 11/12/21   [provider]  metFORMIN (GLUCOPHAGE) 1000 MG tablet Take 1,000 mg by mouth at bedtime.    [provider]  mirabegron ER (MYRBETRIQ) 50 MG TB24 tablet Take 50 mg by mouth daily.     [provider]  Multiple Vitamins-Minerals (CENTRUM ULTRA WOMENS PO) Take by mouth.    [provider]  RESTASIS 0.05 % ophthalmic emulsion Place 1 drop into both eyes in the morning, at noon, and at bedtime. 09/22/21   [provider]  umeclidinium-vilanterol (ANORO ELLIPTA) 62.5-25 MCG/INH AEPB Inhale 1 puff into the lungs daily.    [provider]      Allergies    Rosuvastatin    Review of Systems   Review of Systems  All other systems reviewed and are negative.   Physical Exam Updated Vital Signs BP 124/70   Pulse 66   Temp 97.6 F (36.4 C) (Oral)   Resp 18   Ht 5\' 3"  (1.6 m)   Wt 75.6 kg   SpO2 100%   BMI 29.52 kg/m  Physical Exam Vitals and nursing note reviewed.  Constitutional:      General: She is not in acute distress.    Appearance: Normal appearance.  HENT:     Head: Normocephalic and atraumatic.  Eyes:     General:        Right eye: No discharge.        Left eye: No discharge.  Cardiovascular:     Rate and Rhythm: Normal rate and regular rhythm.     Pulses: Normal pulses.     Comments: DP, PT pulses are 2+ in the affected left lower extremity Pulmonary:  Effort: Pulmonary effort is normal. No respiratory distress.  Musculoskeletal:        General: No deformity.     Comments: Patient with 2+ pitting edema of the left lower extremity, with some overlying skin redness, suspicion for venous stasis dermatitis.  Skin:    General: Skin is warm and dry.     Capillary Refill: Capillary refill takes less than 2 seconds.  Neurological:     Mental Status: She is alert and oriented to person, place, and time.  Psychiatric:        Mood and Affect: Mood normal.        Behavior: Behavior normal.     ED Results / Procedures / Treatments   Labs (all labs ordered are listed, but only abnormal results are displayed) Labs Reviewed  CBC  BASIC METABOLIC PANEL    EKG EKG Interpretation Date/Time:  Wednesday August 05 2023 14:00:07 EDT Ventricular Rate:  76 PR Interval:  162 QRS Duration:  134 QT Interval:  422 QTC Calculation: 474 R Axis:   -80  Text Interpretation: Normal sinus rhythm Right bundle branch block Left anterior fascicular block No significant change since last tracing Confirmed by Ernie Avena ([id number]) on 08/05/2023 7:29:49 PM  Radiology CT VENOGRAM ABD/PELVIS/LOWER EXT BILAT  Result Date: 08/05/2023 CLINICAL DATA:  Swelling in left foot. Documented DVT and has surgery scheduled 03/22/2022. Swelling increased overnight. Patient on Eliquis for 1 Wiggins. Blood in urine. EXAM: CT VENOGRAM ABDOMEN AND PELVIS AND LOWER EXTREMITY BILATERAL TECHNIQUE: Venographic phase images of the abdomen, pelvis and lower extremities were obtained following the administration of intravenous contrast. Multiplanar reformats and maximum intensity projections were generated. RADIATION DOSE REDUCTION: This exam was performed according to the departmental dose-optimization program which includes automated exposure control, adjustment of the mA and/or kV according to patient size and/or use of iterative reconstruction technique. CONTRAST:  OMNIPAQUE IOHEXOL 350 MG/ML SOLN COMPARISON:  Abdominal ultrasound 02/18/2019 FINDINGS: Lower chest: Partially visualized micro nodularity and ground-glass opacity in the left lower lobe as well as in the right middle lobe are likely infectious/inflammatory. Hepatobiliary: Cholecystectomy. Unremarkable liver and biliary tree. Pancreas: Unremarkable. Spleen: Unremarkable. Adrenals/Urinary Tract: Normal adrenal glands. Low-attenuation lesions in the kidneys are statistically likely to represent cysts. No follow-up is required. No urinary calculi or hydronephrosis. Unremarkable bladder. Stomach/Bowel: Normal caliber large and small bowel without bowel wall thickening. Stomach is within normal limits. The appendix is not visualized. Vascular/Lymphatic: Aortic and arterial atherosclerotic  calcification without aneurysm. Subcentimeter retroperitoneal nodes are likely reactive. Reproductive: No acute abnormality. Other: Trace free fluid in the pelvis.  No free intraperitoneal air. Musculoskeletal: No acute fracture. Diffuse soft tissue swelling of the left lower extremity. IVC: No evidence for thrombus or stenosis. Portal and mesenteric veins: No evidence for thrombus or stenosis. Bilateral iliac veins: Occlusive thrombus in the left common, external, and internal iliac veins extending to the junction with the IVC. Patent right common, external, and internal iliac veins. Right lower extremity: No evidence for thrombus involving the common femoral and superficial femoral veins. Left lower extremity: Nonocclusive thrombus the left common and superficial femoral veins. IMPRESSION: 1. Occlusive thrombus in the left common, external, and internal iliac veins extending to the junction with the IVC. 2. Nonocclusive thrombus in the left common and superficial femoral veins. 3. Diffuse soft tissue swelling of the left leg. 4. Partially visualized micro nodularity and ground-glass opacity in the left lower lobe as well as in the right middle lobe are likely infectious/inflammatory. Follow-up in  4-6 weeks is recommended to ensure resolution. Aortic Atherosclerosis (ICD10-I70.0). Critical Value/emergent results were called by telephone at the time of interpretation on 08/05/2023 at 7:28 pm to provider Dr. Hart Rochester, who verbally acknowledged these results. Electronically Signed   By: Minerva Fester M.D.   On: 08/05/2023 19:28    Procedures Procedures    Medications Ordered in ED Medications  iohexol (OMNIPAQUE) 350 MG/ML injection 100 mL (100 mLs Intravenous Contrast Given 08/05/23 1810)    ED Course/ Medical Decision Making/ A&P                                 Medical Decision Making  This patient is a 82 y.o. female who presents to the ED for concern of leg swelling.   Differential diagnoses prior  to evaluation: Ongoing DVT, peripheral edema, Achilles tendon rupture, calf muscle strain, calf muscle rupture, cellulitis, abscess, PAD, PVD, versus other musculoskeletal injury. Additionally considered critical limb ischemia  Past Medical History / Social History / Additional history: Chart reviewed. Pertinent results include:  hypertension, CAD, tobacco abuse, ascending aorta aneurysm, emphysema, was recently diagnosed with left-sided DVT and started on Eliquis 1 Wiggins ago  DVT noted in left common femoral and multiple smaller vessels -- proximal IVC appears patent on [place] from last Wiggins  Physical Exam: Physical exam performed. The pertinent findings include: Patient with 2+ pitting edema of the left lower extremity, with some overlying skin redness, suspicion for venous stasis dermatitis.  DP, PT pulses are 2+ in the affected left lower extremity  Labwork / Imaging: I independently interpreted relevant lab work, results are as follows:CBC unremarkable, BMP unremarkable. I independently interpreted imaging including CT venogram abdomen pelvis and lower extremities which shows extensive clot of the left lower extremity including the internal common iliac vein to the junction of the IVC. I agree with the radiologist interpretation.  Consults: Spoke with Dr. Lenell Antu with vascular surgery at who reports that with the patient's follow-up on Friday, he would only recommend admission for more urgent thrombectomy if patient is having significant pain and does not feel comfortable with discharge.  Otherwise okay to follow-up on Friday for likely thrombectomy early next Wiggins.  After discussion with patient she opts to go home at this time and will follow-up with vascular surgery on Friday as planned.  Concern raised by radiologist for May Thurner syndrome, vascular surgeon is unconvinced however, though this diagnosis is not completely excluded.  Medications / Treatment: Discussed patient can use  compression stockings/hose on the affected extremity to help with venous stasis, encouraged to continue taking her Eliquis, no other acute treatment needed at this time   Disposition: After consideration of the diagnostic results and the patients response to treatment, I feel that patient is stable for discharge with plan as above.   emergency department workup does not suggest an emergent condition requiring admission or immediate intervention beyond what has been performed at this time. The plan is: as above. The patient is safe for discharge and has been instructed to return immediately for worsening symptoms, change in symptoms or any other concerns.  Final Clinical Impression(s) / ED Diagnoses Final diagnoses:  Thromboembolism of internal iliac artery [facility])    Rx / DC Orders ED Discharge Orders     None         [place] 08/05/23 Cleda Daub, MD 08/05/23 2115

## 2023-08-06 DIAGNOSIS — R350 Frequency of micturition: Secondary | ICD-10-CM | POA: Diagnosis not present

## 2023-08-06 DIAGNOSIS — I745 Embolism and thrombosis of iliac artery: Secondary | ICD-10-CM | POA: Diagnosis not present

## 2023-08-06 DIAGNOSIS — N3946 Mixed incontinence: Secondary | ICD-10-CM | POA: Diagnosis not present

## 2023-08-07 ENCOUNTER — Encounter: Payer: Self-pay | Admitting: Vascular Surgery

## 2023-08-07 ENCOUNTER — Other Ambulatory Visit: Payer: Self-pay

## 2023-08-07 ENCOUNTER — Ambulatory Visit: Payer: Medicare Other | Admitting: Vascular Surgery

## 2023-08-07 VITALS — BP 128/84 | HR 78 | Temp 97.6°F | Resp 20 | Ht 63.0 in | Wt 166.0 lb

## 2023-08-07 DIAGNOSIS — I82412 Acute embolism and thrombosis of left femoral vein: Secondary | ICD-10-CM

## 2023-08-07 NOTE — Progress Notes (Signed)
ASSESSMENT & PLAN   EXTENSIVE LEFT LOWER EXTREMITY DVT: This patient has an extensive DVT of the left lower extremity which occurred on 07/28/2023.  This has slightly over a week ago.  She has been on Eliquis.  She has been elevating her legs some but I do not think consistently.  She has not been using compression.  Her chief complaint is significant left lower extremity swelling and I think she is at high risk for long-term venous hypertension and leg swelling.  We discussed the option of continued anticoagulation and conservative treatment versus mechanical thrombectomy.  I think she would be a good candidate for mechanical thrombectomy.  We will have her take her last dose of Eliquis on Sunday evening and plan on mechanical thrombectomy of her left lower extremity DVT on Tuesday with Dr. Myra Gianotti.  I have discussed the indications for the procedure.  I have explained that we can potentially lower her risk of long-term swelling and venous hypertension in the left leg.  We also discussed the potential complications of the procedure including the risk of renal insufficiency, embolization, and bleeding.  I have also discussed with her the importance of leg elevation and the proper positioning for this.  We have fitted her for a thigh-high compression stocking to wear now and also after the procedure.  [name] can you do  REASON FOR CONSULT:    Left lower extremity DVT.  The consult is requested by Dr. Laurann Montana.  HPI:   [name] is a 82 y.o. female who developed a left lower extremity DVT on 07/28/2023.  This was an extensive DVT as documented in the venous duplex scan below.  She was started on Eliquis.  She returned to the emergency department a week later and was seen on 08/05/2023 with increased swelling in the left leg.  The case was discussed with Dr. Lenell Antu who arrange for an office visit today so that we could discuss potential thrombolysis early next week.  The patient's only real  risk factor for DVT is a history of tobacco use.  She does have a remote history of bladder cancer.  She denies any recent long travel, injury, family history of clotting disorders, or previous history of DVT.  Her chief complaint is significant swelling in the left leg.  She denies any chest pain or shortness of breath.  Of note she is considering right hip replacement but this is on hold pending management of this DVT.  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    COPD (chronic obstructive pulmonary disease) (HCC)    Crohn's disease (HCC)    Diabetes mellitus without complication (HCC)    DVT (deep venous thrombosis) (HCC)    Essential hypertension 11/20/2017   Gait difficulty    OAB (overactive bladder)    Smoker    Tobacco abuse 11/20/2017   Vertigo     Family History  Problem Relation Age of Onset   Cancer Mother    Stroke Sister     SOCIAL HISTORY: Social History   Tobacco Use   Smoking status: Former    Current packs/day: 1.00    Average packs/day: 1 pack/day for 40.0 years (40.0 ttl pk-yrs)    Types: Cigarettes, E-cigarettes   Smokeless tobacco: Never   Tobacco comments:    currently  trying to quit 06-26-17  Substance Use Topics   Alcohol use: No    Alcohol/week: 0.0 standard drinks of alcohol    Allergies  Allergen Reactions   Rosuvastatin     myalgia    Current Outpatient Medications  Medication Sig Dispense Refill   aspirin EC 81 MG tablet Take 81 mg by mouth daily.     calcium-vitamin D (OSCAL WITH D) 500-200 MG-UNIT tablet Take 1 tablet by mouth.     ELIQUIS 5 MG TABS tablet Take 5 mg by mouth 2 (two) times daily.     folic acid (FOLVITE) 400 MCG tablet Take 400 mcg by mouth daily.     JARDIANCE 10 MG TABS tablet Take 10 mg by mouth daily.     latanoprost (XALATAN) 0.005 % ophthalmic  solution Place 1 drop into both eyes at bedtime.     lidocaine (LIDODERM) 5 % Place 1 patch onto the skin daily. Remove & Discard patch within 12 hours or as directed by MD 30 patch 0   lisinopril (ZESTRIL) 10 MG tablet Take 10 mg by mouth daily.     mirabegron ER (MYRBETRIQ) 50 MG TB24 tablet Take 50 mg by mouth daily.     Multiple Vitamins-Minerals (CENTRUM ULTRA WOMENS PO) Take by mouth.     oxybutynin (DITROPAN-XL) 10 MG 24 hr tablet Take 10 mg by mouth daily.     RESTASIS 0.05 % ophthalmic emulsion Place 1 drop into both eyes in the morning, at noon, and at bedtime.     umeclidinium-vilanterol (ANORO ELLIPTA) 62.5-25 MCG/INH AEPB Inhale 1 puff into the lungs daily.     atorvastatin (LIPITOR) 40 MG tablet Take 1 tablet (40 mg total) by mouth daily. 90 tablet 1   No current facility-administered medications for this visit.    REVIEW OF SYSTEMS:  [X]  denotes positive finding, [ ]  denotes negative finding Cardiac  Comments:  Chest pain or chest pressure:    Shortness of breath upon exertion:    Short of breath when lying flat:    Irregular heart rhythm:        Vascular    Pain in calf, thigh, or hip brought on by ambulation:    Pain in feet at night that wakes you up from your sleep:     Blood clot in your veins:    Leg swelling:  x       Pulmonary    Oxygen at home:    Productive cough:     Wheezing:         Neurologic    Sudden weakness in arms or legs:     Sudden numbness in arms or legs:     Sudden onset of difficulty speaking or slurred speech:    Temporary loss of vision in one eye:     Problems with dizziness:         Gastrointestinal    Blood in stool:     Vomited blood:         Genitourinary    Burning when urinating:     Blood in urine:        Psychiatric    Major depression:         Hematologic    Bleeding problems:    Problems with blood clotting too easily:        Skin    Rashes or ulcers:        Constitutional    Fever or chills:     -  PHYSICAL  EXAM:   Vitals:   08/07/23 0830  BP: 128/84  Pulse: 78  Resp: 20  Temp: 97.6 F (36.4 C)  SpO2: 95%  Weight: 166 lb (75.3 kg)  Height: 5\' 3"  (1.6 m)   Body mass index is 29.41 kg/m. GENERAL: The patient is a well-nourished female, in no acute distress. The vital signs are documented above. CARDIAC: There is a regular rate and rhythm.  VASCULAR: I do not detect carotid bruits. On the right side she has a palpable dorsalis pedis and posterior tibial pulse. On the left side she has significant swelling so I cannot palpate pulses.  She does have a biphasic dorsalis pedis and posterior tibial signal with the Doppler. She has significant swelling in the left leg up to the hip. PULMONARY: There is good air exchange bilaterally without wheezing or rales. ABDOMEN: Soft and non-tender with normal pitched bowel sounds.  MUSCULOSKELETAL: There are no major deformities. NEUROLOGIC: No focal weakness or paresthesias are detected. SKIN: There are no ulcers or rashes noted. PSYCHIATRIC: The patient has a normal affect.  DATA:    VENOUS DUPLEX: I have reviewed the venous duplex scan that was done on 07/28/2023.  There was an extensive acute DVT of the left lower extremity involving the common femoral vein, femoral vein, popliteal vein, posterior tibial vein, and peroneal veins.  There was also clot in the great saphenous vein.  The inferior vena cava was patent.  The common iliac veins could not be visualized secondary to overlying bowel gas.  Flow could not be detected in the left external iliac vein and thrombus was suspected in this area.  Waverly Ferrari [facility]

## 2023-08-07 NOTE — H&P (View-Only) (Signed)
ASSESSMENT & PLAN   EXTENSIVE LEFT LOWER EXTREMITY DVT: This patient has an extensive DVT of the left lower extremity which occurred on 07/28/2023.  This has slightly over a week ago.  She has been on Eliquis.  She has been elevating her legs some but I do not think consistently.  She has not been using compression.  Her chief complaint is significant left lower extremity swelling and I think she is at high risk for long-term venous hypertension and leg swelling.  We discussed the option of continued anticoagulation and conservative treatment versus mechanical thrombectomy.  I think she would be a good candidate for mechanical thrombectomy.  We will have her take her last dose of Eliquis on Sunday evening and plan on mechanical thrombectomy of her left lower extremity DVT on Tuesday with Dr. Myra Gianotti.  I have discussed the indications for the procedure.  I have explained that we can potentially lower her risk of long-term swelling and venous hypertension in the left leg.  We also discussed the potential complications of the procedure including the risk of renal insufficiency, embolization, and bleeding.  I have also discussed with her the importance of leg elevation and the proper positioning for this.  We have fitted her for a thigh-high compression stocking to wear now and also after the procedure.  [name] can you do  REASON FOR CONSULT:    Left lower extremity DVT.  The consult is requested by Dr. Laurann Montana.  HPI:   [name] is a 82 y.o. female who developed a left lower extremity DVT on 07/28/2023.  This was an extensive DVT as documented in the venous duplex scan below.  She was started on Eliquis.  She returned to the emergency department a week later and was seen on 08/05/2023 with increased swelling in the left leg.  The case was discussed with Dr. Lenell Antu who arrange for an office visit today so that we could discuss potential thrombolysis early next week.  The patient's only real  risk factor for DVT is a history of tobacco use.  She does have a remote history of bladder cancer.  She denies any recent long travel, injury, family history of clotting disorders, or previous history of DVT.  Her chief complaint is significant swelling in the left leg.  She denies any chest pain or shortness of breath.  Of note she is considering right hip replacement but this is on hold pending management of this DVT.  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    COPD (chronic obstructive pulmonary disease) (HCC)    Crohn's disease (HCC)    Diabetes mellitus without complication (HCC)    DVT (deep venous thrombosis) (HCC)    Essential hypertension 11/20/2017   Gait difficulty    OAB (overactive bladder)    Smoker    Tobacco abuse 11/20/2017   Vertigo     Family History  Problem Relation Age of Onset   Cancer Mother    Stroke Sister     SOCIAL HISTORY: Social History   Tobacco Use   Smoking status: Former    Current packs/day: 1.00    Average packs/day: 1 pack/day for 40.0 years (40.0 ttl pk-yrs)    Types: Cigarettes, E-cigarettes   Smokeless tobacco: Never   Tobacco comments:    currently  trying to quit 06-26-17  Substance Use Topics   Alcohol use: No    Alcohol/week: 0.0 standard drinks of alcohol    Allergies  Allergen Reactions   Rosuvastatin     myalgia    Current Outpatient Medications  Medication Sig Dispense Refill   aspirin EC 81 MG tablet Take 81 mg by mouth daily.     calcium-vitamin D (OSCAL WITH D) 500-200 MG-UNIT tablet Take 1 tablet by mouth.     ELIQUIS 5 MG TABS tablet Take 5 mg by mouth 2 (two) times daily.     folic acid (FOLVITE) 400 MCG tablet Take 400 mcg by mouth daily.     JARDIANCE 10 MG TABS tablet Take 10 mg by mouth daily.     latanoprost (XALATAN) 0.005 % ophthalmic  solution Place 1 drop into both eyes at bedtime.     lidocaine (LIDODERM) 5 % Place 1 patch onto the skin daily. Remove & Discard patch within 12 hours or as directed by MD 30 patch 0   lisinopril (ZESTRIL) 10 MG tablet Take 10 mg by mouth daily.     mirabegron ER (MYRBETRIQ) 50 MG TB24 tablet Take 50 mg by mouth daily.     Multiple Vitamins-Minerals (CENTRUM ULTRA WOMENS PO) Take by mouth.     oxybutynin (DITROPAN-XL) 10 MG 24 hr tablet Take 10 mg by mouth daily.     RESTASIS 0.05 % ophthalmic emulsion Place 1 drop into both eyes in the morning, at noon, and at bedtime.     umeclidinium-vilanterol (ANORO ELLIPTA) 62.5-25 MCG/INH AEPB Inhale 1 puff into the lungs daily.     atorvastatin (LIPITOR) 40 MG tablet Take 1 tablet (40 mg total) by mouth daily. 90 tablet 1   No current facility-administered medications for this visit.    REVIEW OF SYSTEMS:  [X]  denotes positive finding, [ ]  denotes negative finding Cardiac  Comments:  Chest pain or chest pressure:    Shortness of breath upon exertion:    Short of breath when lying flat:    Irregular heart rhythm:        Vascular    Pain in calf, thigh, or hip brought on by ambulation:    Pain in feet at night that wakes you up from your sleep:     Blood clot in your veins:    Leg swelling:  x       Pulmonary    Oxygen at home:    Productive cough:     Wheezing:         Neurologic    Sudden weakness in arms or legs:     Sudden numbness in arms or legs:     Sudden onset of difficulty speaking or slurred speech:    Temporary loss of vision in one eye:     Problems with dizziness:         Gastrointestinal    Blood in stool:     Vomited blood:         Genitourinary    Burning when urinating:     Blood in urine:        Psychiatric    Major depression:         Hematologic    Bleeding problems:    Problems with blood clotting too easily:        Skin    Rashes or ulcers:        Constitutional    Fever or chills:     -  PHYSICAL  EXAM:   Vitals:   08/07/23 0830  BP: 128/84  Pulse: 78  Resp: 20  Temp: 97.6 F (36.4 C)  SpO2: 95%  Weight: 166 lb (75.3 kg)  Height: 5\' 3"  (1.6 m)   Body mass index is 29.41 kg/m. GENERAL: The patient is a well-nourished female, in no acute distress. The vital signs are documented above. CARDIAC: There is a regular rate and rhythm.  VASCULAR: I do not detect carotid bruits. On the right side she has a palpable dorsalis pedis and posterior tibial pulse. On the left side she has significant swelling so I cannot palpate pulses.  She does have a biphasic dorsalis pedis and posterior tibial signal with the Doppler. She has significant swelling in the left leg up to the hip. PULMONARY: There is good air exchange bilaterally without wheezing or rales. ABDOMEN: Soft and non-tender with normal pitched bowel sounds.  MUSCULOSKELETAL: There are no major deformities. NEUROLOGIC: No focal weakness or paresthesias are detected. SKIN: There are no ulcers or rashes noted. PSYCHIATRIC: The patient has a normal affect.  DATA:    VENOUS DUPLEX: I have reviewed the venous duplex scan that was done on 07/28/2023.  There was an extensive acute DVT of the left lower extremity involving the common femoral vein, femoral vein, popliteal vein, posterior tibial vein, and peroneal veins.  There was also clot in the great saphenous vein.  The inferior vena cava was patent.  The common iliac veins could not be visualized secondary to overlying bowel gas.  Flow could not be detected in the left external iliac vein and thrombus was suspected in this area.  Waverly Ferrari [facility]

## 2023-08-11 ENCOUNTER — Ambulatory Visit (HOSPITAL_COMMUNITY)
Admission: RE | Disposition: A | Discharge status: Home / Self Care | Payer: Self-pay | Source: Home / Self Care | Attending: Surgery

## 2023-08-11 ENCOUNTER — Ambulatory Visit (HOSPITAL_COMMUNITY)
Admission: RE | Admit: 2023-08-11 | Discharge: 2023-08-11 | Disposition: A | Discharge status: Home / Self Care | Payer: Medicare Other | Attending: Surgery | Admitting: Surgery

## 2023-08-11 DIAGNOSIS — I10 Essential (primary) hypertension: Secondary | ICD-10-CM | POA: Insufficient documentation

## 2023-08-11 DIAGNOSIS — Z87891 Personal history of nicotine dependence: Secondary | ICD-10-CM | POA: Diagnosis not present

## 2023-08-11 DIAGNOSIS — E119 Type 2 diabetes mellitus without complications: Secondary | ICD-10-CM | POA: Insufficient documentation

## 2023-08-11 DIAGNOSIS — I82422 Acute embolism and thrombosis of left iliac vein: Secondary | ICD-10-CM | POA: Insufficient documentation

## 2023-08-11 DIAGNOSIS — I2510 Atherosclerotic heart disease of native coronary artery without angina pectoris: Secondary | ICD-10-CM | POA: Insufficient documentation

## 2023-08-11 DIAGNOSIS — I82412 Acute embolism and thrombosis of left femoral vein: Secondary | ICD-10-CM

## 2023-08-11 DIAGNOSIS — Z7901 Long term (current) use of anticoagulants: Secondary | ICD-10-CM | POA: Insufficient documentation

## 2023-08-11 DIAGNOSIS — Z8551 Personal history of malignant neoplasm of bladder: Secondary | ICD-10-CM | POA: Diagnosis not present

## 2023-08-11 DIAGNOSIS — I82492 Acute embolism and thrombosis of other specified deep vein of left lower extremity: Secondary | ICD-10-CM

## 2023-08-11 HISTORY — PX: PERIPHERAL VASCULAR THROMBECTOMY: CATH118306

## 2023-08-11 HISTORY — PX: PERIPHERAL VASCULAR BALLOON ANGIOPLASTY: CATH118281

## 2023-08-11 LAB — POCT I-STAT, CHEM 8
BUN: 27 mg/dL — ABNORMAL HIGH (ref 8–23)
Calcium, Ion: 1.19 mmol/L (ref 1.15–1.40)
Chloride: 105 mmol/L (ref 98–111)
Creatinine, Ser: 0.9 mg/dL (ref 0.44–1.00)
Glucose, Bld: 120 mg/dL — ABNORMAL HIGH (ref 70–99)
HCT: 42 % (ref 36.0–46.0)
Hemoglobin: 14.3 g/dL (ref 12.0–15.0)
Potassium: 4.3 mmol/L (ref 3.5–5.1)
Sodium: 141 mmol/L (ref 135–145)
TCO2: 24 mmol/L (ref 22–32)

## 2023-08-11 LAB — POCT ACTIVATED CLOTTING TIME
Activated Clotting Time: 146 seconds
Activated Clotting Time: 269 seconds

## 2023-08-11 LAB — GLUCOSE, CAPILLARY: Glucose-Capillary: 97 mg/dL (ref 70–99)

## 2023-08-11 SURGERY — PERIPHERAL VASCULAR THROMBECTOMY
Anesthesia: LOCAL

## 2023-08-11 MED ORDER — MIDAZOLAM HCL 2 MG/2ML IJ SOLN
INTRAMUSCULAR | Status: AC
  Filled 2023-08-11: qty 2

## 2023-08-11 MED ORDER — FENTANYL CITRATE (PF) 100 MCG/2ML IJ SOLN
INTRAMUSCULAR | Status: AC
  Filled 2023-08-11: qty 2

## 2023-08-11 MED ORDER — MIDAZOLAM HCL 2 MG/2ML IJ SOLN
INTRAMUSCULAR | Status: DC | PRN
  Administered 2023-08-11 (×2): 1 mg via INTRAVENOUS

## 2023-08-11 MED ORDER — LIDOCAINE HCL (PF) 1 % IJ SOLN
INTRAMUSCULAR | Status: AC
  Filled 2023-08-11: qty 30

## 2023-08-11 MED ORDER — HEPARIN SODIUM (PORCINE) 1000 UNIT/ML IJ SOLN
INTRAMUSCULAR | Status: AC
  Filled 2023-08-11: qty 10

## 2023-08-11 MED ORDER — IODIXANOL 320 MG/ML IV SOLN
INTRAVENOUS | Status: DC | PRN
  Administered 2023-08-11: 40 mL

## 2023-08-11 MED ORDER — SODIUM CHLORIDE 0.9 % IV SOLN: INTRAVENOUS | Status: DC

## 2023-08-11 MED ORDER — HEPARIN (PORCINE) IN NACL 1000-0.9 UT/500ML-% IV SOLN
INTRAVENOUS | Status: DC | PRN
  Administered 2023-08-11 (×2): 500 mL

## 2023-08-11 MED ORDER — HEPARIN SODIUM (PORCINE) 1000 UNIT/ML IJ SOLN
INTRAMUSCULAR | Status: DC | PRN
  Administered 2023-08-11: 8000 [IU] via INTRAVENOUS

## 2023-08-11 MED ORDER — LIDOCAINE HCL (PF) 1 % IJ SOLN
INTRAMUSCULAR | Status: DC | PRN
  Administered 2023-08-11: 5 mL

## 2023-08-11 MED ORDER — APIXABAN 5 MG PO TABS
5.0000 mg | ORAL_TABLET | Freq: Two times a day (BID) | ORAL | Status: DC
  Administered 2023-08-11: 5 mg via ORAL
  Filled 2023-08-11: qty 1

## 2023-08-11 MED ORDER — FENTANYL CITRATE (PF) 100 MCG/2ML IJ SOLN
INTRAMUSCULAR | Status: DC | PRN
  Administered 2023-08-11: 25 ug via INTRAVENOUS
  Administered 2023-08-11: 50 ug via INTRAVENOUS

## 2023-08-11 SURGICAL SUPPLY — 16 items
BALLN MUSTANG 10X80X75 (BALLOONS) ×2
BALLN MUSTANG 12X80X75 (BALLOONS) ×2
BALLOON MUSTANG 10X80X75 (BALLOONS) IMPLANT
BALLOON MUSTANG 12X80X75 (BALLOONS) IMPLANT
CATH ANGIO 5F BER2 100CM (CATHETERS) IMPLANT
CATH CLOT TRIEVER BOLD (CATHETERS) IMPLANT
CATH VISIONS PV .035 IVUS (CATHETERS) IMPLANT
COVER DOME SNAP 22 D (MISCELLANEOUS) IMPLANT
KIT MICROPUNCTURE NIT STIFF (SHEATH) IMPLANT
PROTECTION STATION PRESSURIZED (MISCELLANEOUS) ×2
SHEATH CLOT RETRIEVER (SHEATH) IMPLANT
SHEATH PINNACLE 8F 10CM (SHEATH) IMPLANT
SHEATH PROBE COVER 6X72 (BAG) IMPLANT
STATION PROTECTION PRESSURIZED (MISCELLANEOUS) IMPLANT
WIRE AMPLATZ SS-J .035X260CM (WIRE) IMPLANT
WIRE BENTSON .035X145CM (WIRE) IMPLANT

## 2023-08-11 NOTE — Interval H&P Note (Signed)
History and Physical Interval Note:  08/11/2023 9:44 AM  [name]  has presented today for surgery, with the diagnosis of DVT left lower extremity.  The various methods of treatment have been discussed with the patient and family. After consideration of risks, benefits and other options for treatment, the patient has consented to  Procedure(s): PERIPHERAL VASCULAR THROMBECTOMY VENOUS (N/A) as a surgical intervention.  The patient's history has been reviewed, patient examined, no change in status, stable for surgery.  I have reviewed the patient's chart and labs.  Questions were answered to the patient's satisfaction.     [name]

## 2023-08-11 NOTE — Op Note (Signed)
Patient name: [name] MRN: [id number] DOB: 24-Dec-1940 Sex: female  08/11/2023 Pre-operative Diagnosis: Left leg DVT Post-operative diagnosis:  Same Surgeon:  [name] Procedure Performed:  1.  Ultrasound-guided access, left popliteal vein  2.  Intravascular ultrasound (IVUS) left popliteal, femoral, common femoral, external iliac, and common iliac vein, and inferior vena cava  3.  Mechanical venous thrombectomy (INARI): Left popliteal, femoral, common femoral, external iliac, and common iliac vein  4.  Balloon venoplasty: Left popliteal, femoral, common femoral, external iliac, and common iliac vein  5.  Venogram: Left popliteal, femoral, common femoral, external iliac, and common iliac vein as well as inferior vena cava  6.  Conscious sedation, 57 minutes    Indications: This is a 82 year old female with left iliofemoral DVT with persistent pain and swelling who comes in today for venogram and possible intervention.  Procedure:  The patient was identified in the holding area and taken to room 8.  The patient was then placed supine on the table and prepped and draped in the usual sterile fashion.  A time out was called.  Conscious sedation was administered with the use of IV fentanyl and Versed under continuous physician and nurse monitoring.  Heart rate, blood pressure, and oxygen saturation were continuously monitored.  Total sedation time was 57 minutes.  Ultrasound was used to evaluate the left popliteal vein.  At the level of the knee this did not have any significant thrombus.  1% lidocaine was used for local anesthesia and the left popliteal vein was cannulated under ultrasound guidance with a micropuncture needle.  A 018 wire was inserted without resistance followed by placement of micropuncture sheath.  Over a Bentson wire, a 8 French sheath was placed.  The patient was fully heparinized.  I then used a Berenstein 2 catheter and a Glidewire advantage to get wire access into  the left subclavian vein.  Next, the IVUS catheter was inserted and IVUS was performed of the left popliteal, femoral, common femoral, external iliac, and common iliac vein as well as the inferior vena cava.  No thrombus was visualized in the inferior vena cava.  There was thrombus within the iliofemoral veins.  Next, the Inari sheath was inserted.  The ball device was then placed and mechanical venous thrombectomy was performed of the left common iliac, external iliac, common femoral, femoral, and popliteal veins.  4 passes were made at different clock positions.  I then reinserted the IVUS device.  Most of the thrombus had been evacuated except for an area in the external iliac vein.  I therefore reinserted the Inari device and repeated thrombectomy.  I did not get any additional thrombus out.  I then performed venogram.  The popliteal and femoral veins were widely patent as was the common femoral vein.  There was a filling defect that was not occlusive in the extrailiac vein.  The common iliac vein and inferior vena cava are widely patent.  I then performed balloon venoplasty of the left common iliac, external iliac, common femoral, femoral and popliteal veins with a 10 mm Mustang balloon.  There was still an area of filling defect in the external iliac vein and so I repeated balloon venoplasty with a 12 mm balloon in the common and external iliac veins.  The balloon was able to be easily moved antegrade and retrograde at full inflation within the iliac venous system.  Please venogram showed inline flow throughout the left leg venous system into the inferior vena cava.  I  was satisfied with these results.  I did not want to place a cath in a 82 year old having her first DVT.  I did not feel that there was any significant May-Thurner syndrome.  Sheaths and wires were removed and a Monocryl was used to close the access site.   Impression:  #1  Successful mechanical venous thrombectomy.  The majority the clot  was in the common and external iliac vein.  I was able to remove almost all of the clots, some of which appeared to be chronic.  There was a small filling defect in the external iliac vein however a 12 mm balloon was easily able to be moved freely within the veins and so I elected not to stent this.  #2  The patient will continue anticoagulation for 3 months.  She may require referral to hematology if an obvious explanation for her DVT is not identified     V. [name], M.D., [facility] [place] Office: [phone] Pager:  [phone]

## 2023-08-11 NOTE — Progress Notes (Signed)
Orthopedic Tech Progress Note Patient Details:  [name] 11-18-1941 [id number] Called order into [facility] for Compression socks Patient ID: [name], female   DOB: 11-07-1941, 82 y.o.   MRN: [id number]  [name] 08/11/2023, 12:41 PM

## 2023-08-11 NOTE — Discharge Instructions (Signed)

## 2023-08-12 ENCOUNTER — Encounter (HOSPITAL_COMMUNITY): Payer: Self-pay | Admitting: Surgery

## 2023-08-12 ENCOUNTER — Telehealth: Payer: Self-pay | Admitting: Surgery

## 2023-08-12 ENCOUNTER — Telehealth: Payer: Self-pay

## 2023-08-12 NOTE — Telephone Encounter (Signed)
Pt's spouse called to clarify Eliquis rx. He has no further questions/concerns at this time.

## 2023-08-12 NOTE — Telephone Encounter (Signed)
-----   Message from [name] sent at 08/11/2023  5:59 PM EDT ----- 08/11/2023:   Surgeon:  [name] Procedure Performed:  1.  Ultrasound-guided access, left popliteal vein  2.  Intravascular ultrasound (IVUS) left popliteal, femoral, common femoral, external iliac, and common iliac vein, and inferior vena cava  3.  Mechanical venous thrombectomy (INARI): Left popliteal, femoral, common femoral, external iliac, and common iliac vein  4.  Balloon venoplasty: Left popliteal, femoral, common femoral, external iliac, and common iliac vein  5.  Venogram: Left popliteal, femoral, common femoral, external iliac, and common iliac vein as well as inferior vena cava  6.  Conscious sedation, 57 minutes   Schedule the patient to follow-up with me in 1 month with ilio caval venous ultrasound as well as left leg DVT ultrasound

## 2023-08-20 ENCOUNTER — Other Ambulatory Visit: Payer: Self-pay | Admitting: *Deleted

## 2023-08-20 DIAGNOSIS — I82412 Acute embolism and thrombosis of left femoral vein: Secondary | ICD-10-CM

## 2023-08-27 ENCOUNTER — Ambulatory Visit (INDEPENDENT_AMBULATORY_CARE_PROVIDER_SITE_OTHER)
Admission: RE | Admit: 2023-08-27 | Discharge: 2023-08-27 | Disposition: A | Discharge status: Home / Self Care | Payer: Medicare Other | Source: Ambulatory Visit | Attending: Vascular Surgery | Admitting: Vascular Surgery

## 2023-08-27 ENCOUNTER — Ambulatory Visit (HOSPITAL_COMMUNITY)
Admission: RE | Admit: 2023-08-27 | Discharge: 2023-08-27 | Disposition: A | Discharge status: Home / Self Care | Payer: Medicare Other | Source: Ambulatory Visit | Attending: Vascular Surgery | Admitting: Vascular Surgery

## 2023-08-27 DIAGNOSIS — I82412 Acute embolism and thrombosis of left femoral vein: Secondary | ICD-10-CM | POA: Diagnosis present

## 2023-09-02 DIAGNOSIS — E1140 Type 2 diabetes mellitus with diabetic neuropathy, unspecified: Secondary | ICD-10-CM | POA: Diagnosis not present

## 2023-09-21 ENCOUNTER — Encounter: Payer: Self-pay | Admitting: Surgery

## 2023-09-21 ENCOUNTER — Ambulatory Visit (INDEPENDENT_AMBULATORY_CARE_PROVIDER_SITE_OTHER): Payer: Medicare Other | Admitting: Surgery

## 2023-09-21 VITALS — BP 113/78 | HR 72 | Temp 98.0°F | Resp 20 | Ht 63.5 in | Wt 162.0 lb

## 2023-09-21 DIAGNOSIS — I82412 Acute embolism and thrombosis of left femoral vein: Secondary | ICD-10-CM | POA: Diagnosis not present

## 2023-09-21 NOTE — Progress Notes (Signed)
[facility]  Patient name: [name] MRN: [id number] DOB: Oct 11, 1941 Sex: female   REASON FOR VISIT:    Follow up  HISOTRY OF PRESENT ILLNESS:    [name] is a 82 y.o. female who suffered a left leg DVT on 07/28/2023.  Her risk factor is a remote history of bladder cancer and tobacco abuse.  She denied any travel history, family history of clotting disorders or prior history of DVT.  On 08/11/2023 she underwent mechanical thrombectomy and venoplasty.  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    COPD (chronic obstructive pulmonary disease) (HCC)    Crohn's disease (HCC)    Diabetes mellitus without complication (HCC)    DVT (deep venous thrombosis) (HCC)    Essential hypertension 11/20/2017   Gait difficulty    OAB (overactive bladder)    Smoker    Tobacco abuse 11/20/2017   Vertigo      FAMILY HISTORY:   Family History  Problem Relation Age of Onset   Cancer Mother    Stroke Sister     SOCIAL HISTORY:   Social History   Tobacco Use   Smoking status: Former    Current packs/day: 1.00    Average packs/day: 1 pack/day for 40.0 years (40.0 ttl pk-yrs)    Types: Cigarettes, E-cigarettes   Smokeless tobacco: Never   Tobacco comments:    currently trying to quit 06-26-17  Substance Use Topics   Alcohol use: No    Alcohol/week: 0.0 standard drinks of alcohol     ALLERGIES:   Allergies  Allergen Reactions   Rosuvastatin     myalgia     CURRENT MEDICATIONS:   Current Outpatient Medications  Medication Sig Dispense Refill   aspirin EC 81 MG tablet Take 81 mg by mouth daily.     calcium-vitamin D (OSCAL WITH D) 500-200 MG-UNIT tablet Take 1 tablet by mouth daily with  breakfast.     Cholecalciferol (VITAMIN D-3) 125 MCG (5000 UT) TABS Take 5,000 Units by mouth daily.     CINNAMON PO Take 1 tablet by mouth daily.     Cyanocobalamin (VITAMIN B-12) 5000 MCG TBDP Take 5,000 mcg by mouth daily.     ELIQUIS 5 MG TABS tablet Take 5 mg by mouth 2 (two) times daily.     JARDIANCE 10 MG TABS tablet Take 20 mg by mouth daily.     latanoprost (XALATAN) 0.005 % ophthalmic solution Place 1 drop into both eyes at bedtime.     lisinopril (ZESTRIL) 2.5 MG tablet Take 2.5 mg by mouth daily.     Multiple Vitamins-Minerals (CENTRUM ULTRA WOMENS PO) Take by mouth.     ondansetron (ZOFRAN-ODT) 4 MG disintegrating tablet Take 4 mg by mouth every 8 (eight) hours as needed for vomiting or nausea.     RESTASIS 0.05 % ophthalmic emulsion Place 1 drop into both eyes 2 (two) times daily.     traMADol (ULTRAM) 50 MG tablet Take 50 mg by mouth 4 (four) times daily as needed for moderate pain or severe pain.     umeclidinium-vilanterol (ANORO ELLIPTA) 62.5-25 MCG/INH AEPB Inhale 1 puff into the lungs daily.     Vibegron (GEMTESA) 75 MG TABS Take 75 mg by  mouth daily.     Wheat Dextrin (BENEFIBER) CHEW Chew 2 tablets by mouth daily.     atorvastatin (LIPITOR) 40 MG tablet Take 1 tablet (40 mg total) by mouth daily. 90 tablet 1   No current facility-administered medications for this visit.    REVIEW OF SYSTEMS:   [X]  denotes positive finding, [ ]  denotes negative finding Cardiac  Comments:  Chest pain or chest pressure:    Shortness of breath upon exertion:    Short of breath when lying flat:    Irregular heart rhythm:        Vascular    Pain in calf, thigh, or hip brought on by ambulation:    Pain in feet at night that wakes you up from your sleep:     Blood clot in your veins:    Leg swelling:         Pulmonary    Oxygen at home:    Productive cough:     Wheezing:         Neurologic    Sudden weakness in arms or legs:     Sudden numbness in arms or legs:     Sudden  onset of difficulty speaking or slurred speech:    Temporary loss of vision in one eye:     Problems with dizziness:         Gastrointestinal    Blood in stool:     Vomited blood:         Genitourinary    Burning when urinating:     Blood in urine:        Psychiatric    Major depression:         Hematologic    Bleeding problems:    Problems with blood clotting too easily:        Skin    Rashes or ulcers:        Constitutional    Fever or chills:      PHYSICAL EXAM:   Vitals:   09/21/23 0930  BP: 113/78  Pulse: 72  Resp: 20  Temp: 98 F (36.7 C)  SpO2: 92%  Weight: 162 lb (73.5 kg)  Height: 5' 3.5" (1.613 m)    GENERAL: The patient is a well-nourished female, in no acute distress. The vital signs are documented above. CARDIAC: There is a regular rate and rhythm.  VASCULAR: No significant leg swelling PULMONARY: Non-labored respirations ABDOMEN: Soft and non-tender with normal pitched bowel sounds.  MUSCULOSKELETAL: There are no major deformities or cyanosis. NEUROLOGIC: No focal weakness or paresthesias are detected. SKIN: There are no ulcers or rashes noted. PSYCHIATRIC: The patient has a normal affect.  STUDIES:   I have reviewed the following:  Right leg venous ultrasound: RIGHT:  - No evidence of common femoral vein obstruction.    LEFT:  - No evidence of deep vein thrombosis in the lower extremity. No indirect  evidence of obstruction proximal to the inguinal ligament.    - There is no evidence of superficial venous thrombosis.   Ileal caval ultrasound: IVC/Iliac: There is no evidence of thrombus involving the IVC. There is no  evidence of thrombus involving the left common iliac vein. There is no  evidence of thrombus involving the left external iliac vein.  MEDICAL ISSUES:   Right leg DVT: Studies today showed no residual DVT.  Etiology of her DVT is unknown.  Therefore I am making a referral to hematology to help determine the length of  anticoagulation that  is necessary.  She really wants to get her hip fixed and so she may need to be transitioned off Eliquis to Lovenox in order to have her surgery.  Given that her ultrasound was negative and that she is essentially asymptomatic, I will have her follow-up on an as-needed basis.    Charlena Cross, MD, FACS [facility] [phone] Pager [phone]

## 2023-09-24 ENCOUNTER — Telehealth: Payer: Self-pay | Admitting: Family Medicine

## 2023-09-24 DIAGNOSIS — N3941 Urge incontinence: Secondary | ICD-10-CM | POA: Diagnosis not present

## 2023-09-24 DIAGNOSIS — C670 Malignant neoplasm of trigone of bladder: Secondary | ICD-10-CM | POA: Diagnosis not present

## 2023-09-24 NOTE — Telephone Encounter (Signed)
Called and left VM for patient to call back to schedule an appointment.

## 2023-09-25 ENCOUNTER — Telehealth: Payer: Self-pay

## 2023-09-25 NOTE — Telephone Encounter (Signed)
Scheduled appointments per referral. Patient is aware of the appointment time and date as well as the address. Patient was informed to arrive 10-15 minutes prior with updated insurance information. All questions were answered.

## 2023-09-29 DIAGNOSIS — B351 Tinea unguium: Secondary | ICD-10-CM | POA: Diagnosis not present

## 2023-09-29 DIAGNOSIS — M19071 Primary osteoarthritis, right ankle and foot: Secondary | ICD-10-CM | POA: Diagnosis not present

## 2023-09-29 DIAGNOSIS — L089 Local infection of the skin and subcutaneous tissue, unspecified: Secondary | ICD-10-CM | POA: Diagnosis not present

## 2023-09-29 DIAGNOSIS — E1151 Type 2 diabetes mellitus with diabetic peripheral angiopathy without gangrene: Secondary | ICD-10-CM | POA: Diagnosis not present

## 2023-09-29 DIAGNOSIS — L84 Corns and callosities: Secondary | ICD-10-CM | POA: Diagnosis not present

## 2023-09-29 DIAGNOSIS — M19072 Primary osteoarthritis, left ankle and foot: Secondary | ICD-10-CM | POA: Diagnosis not present

## 2023-09-29 DIAGNOSIS — L97521 Non-pressure chronic ulcer of other part of left foot limited to breakdown of skin: Secondary | ICD-10-CM | POA: Diagnosis not present

## 2023-10-08 NOTE — Progress Notes (Signed)
[facility] CONSULT NOTE  Patient Care Team: Laurann Montana, MD as PCP - General (Family Medicine) Chilton Si, MD as PCP - Cardiology (Cardiology)   ASSESSMENT & PLAN:  82 y.o.female referred to [facility] for history of Acute deep vein thrombosis (DVT) of femoral vein of left lower extremity. Tasma has history of hypertension, spinal stenosis, CAD, tobacco abuse, ascending aorta aneurysm, emphysema.  First episode: 07/2023 Acute deep vein thrombosis (DVT) of femoral vein of left lower extremity  Setting: on aspirin 162 mg while developed dvt. Vaping.  Risk factor: Crohn's disease, personal history, DM. Treatment: apixaban twice daily  Recommend continue apixaban 5 mg twice daily until 48 hours before surgery or at surgeon's recommendation Recommend resume apixaban 5 mg twice daily postop when hemostasis. Follow up with cardiology in the next few months on need for aspirin  Follow up in 3 months with me about 2-3 months post hip replacement   Patient education for risk factors and prevention of clotting We talked about modifiable risk factors.  Prevention of clotting like deep vein thrombosis including: Strong risk factors including fractures of lower limb, hospitalization for severe illness, such as heart failure, myocardial infarction, spinal cord injury, major trauma, hip or knee replacement, and previous VTE. avoid prolonged immobilization and moving extremities every 1-2 hours during long car rides or flights.  Taking a break and moving extremities if working in a job setting with prolonged sitting.   Avoid dehydration, especially in high altitude. Avoid cigarette smoking Avoid use of hormone replacement therapy, estrogen-containing contraceptive in women.   Maintaining healthy lifestyle to prevent development of diabetes.  Weight loss if BMI over 30.  Regular exercises but not extreme.   Other risk factors for clotting are  surgery, hospitalization, inflammatory disease or severe infection, trauma or injuries from inflammatory state and stasis. If developing one-sided leg swelling, pain, color change, chest pain, sudden short of breath, difficulty taking deep breaths, taking deep breath with chest discomfort or pain, dizziness or heart racing sensation, go to the emergency room immediately for evaluation. If developing trauma, uncontrolled bleeding, such as bloody stools report ED immediately. Avoid NSAIDs, aspirin while on blood thinner.  Discussed bleeding precautions and avoid high risk activities for falling while on anticoagulation. Anticoagulants do not cause bleeding.  Rather, if bleeding occurs when one is on anticoagulation, it may take longer for the bleeding to stop due to reduce coagulation capacity.  I reviewed with the patient about the plan for care for DVT/PE.  See me in 3 months  No orders of the defined types were placed in this encounter.  All questions were answered. The patient knows to call the clinic with any problems, questions or concerns.  Melven Sartorius, MD 10/28/202411:03 AM  CHIEF COMPLAINTS/PURPOSE OF CONSULTATION:  DVT  HISTORY OF PRESENTING ILLNESS:  [name] 82 y.o. female is here because of VTE.  VTE history  She woke up and noted left leg was 3x size of normal.  Red, pain. No chest pain, or difficulty breathing.  07/28/23 VAS [place] LOWER EXTREMITY VENOUS ( DVT) LEFT: - Findings consistent with acute deep vein thrombosis involving the left common femoral vein, SF junction, left femoral vein, left proximal profunda vein, left popliteal vein, left posterior tibial veins, left peroneal veins, and left gastrocnemius veins.  Attempted visualization of the IVC and iliac veins. IVC prox appears patent. Right external iliac vein appears patent. Unable to visualize the bilateral common iliac veins due to  bowel gas. Unable to detect flow in the left external iliac vein, suspected  thrombosis in this area.  Eliquis samples given per report.  08/05/23 she went to an urgent care and was sent to ED due to the degree of left leg swelling. She denies any chest pain, shortness of breath  CTV AP and LE.  1. Occlusive thrombus in the left common, external, and internal iliac veins extending to the junction with the IVC. 2. Nonocclusive thrombus in the left common and superficial femoral veins. 3. Diffuse soft tissue swelling of the left leg. 4. Partially visualized micro nodularity and ground-glass opacity in left lower lobe as well as in the right middle lobe are likely infectious/inflammatory. Follow-up in 4-6 weeks is recommended to ensure resolution.  08/11/23 Mechanical venous thrombectomy [facility]): Left popliteal, femoral, common femoral, external iliac, and common iliac vein. Venoplasty.   08/27/23 LE [place] RIGHT: - No evidence of common femoral vein obstruction.  LEFT: - No evidence of deep vein thrombosis in the lower extremity. No indirect evidence of obstruction proximal to the inguinal ligament.  - There is no evidence of superficial venous thrombosis.  08/27/23 VAS [place] IVC/ ILIAC ( VENOUS ONLY) IVC/ Iliac: There is no evidence of thrombus involving the IVC. There is no evidence of thrombus involving the left common iliac vein. There is no evidence of thrombus involving the left external iliac vein.  She was supposed to have left hip replacement but postpone due to DVT.  She denies DVT from previous foot and knee surgery. She has been taking 2 baby aspirin for the last six months and now off since started apixaban.   She was vaping when DVT occurred.  2 pregnancies without blood clot.   No family history of blood clot.  No bloody stool or urine on apixaban.   Report history of scoliosis. She also has knee and foot arthritis.  MEDICAL HISTORY:  Past Medical History:  Diagnosis Date   Aneurysm of ascending aorta (HCC) 06/11/2018   4.1cm on CT 11/2017.    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    COPD (chronic obstructive pulmonary disease) (HCC)    Crohn's disease (HCC)    Diabetes mellitus without complication (HCC)    DVT (deep venous thrombosis) (HCC)    Essential hypertension 11/20/2017   Gait difficulty    OAB (overactive bladder)    Smoker    Tobacco abuse 11/20/2017   Vertigo     SURGICAL HISTORY: Past Surgical History:  Procedure Laterality Date   BLADDER SURGERY     CHOLECYSTECTOMY     FOOT ARTHRODESIS Left 07/02/2017   Procedure: Left Subtalar Arthrodesis;  Surgeon: Toni Arthurs, MD;  Location: [facility];  Service: Orthopedics;  Laterality: Left;   FRACTURE SURGERY     HARDWARE REMOVAL Left 04/24/2016   Procedure: REMOVAL OF DEEP IMPLANT LEFT FOOT;  Surgeon: Toni Arthurs, MD;  Location: [facility];  Service: Orthopedics;  Laterality: Left;  ANESTHESIA:  REGIONAL/MONITOR ANESTHESIA   HARDWARE REMOVAL Left 07/02/2017   Procedure: Removal of Deep Implants;  Surgeon: Toni Arthurs, MD;  Location: [facility];  Service: Orthopedics;  Laterality: Left;   JOINT REPLACEMENT     knee   PERIPHERAL VASCULAR BALLOON ANGIOPLASTY  08/11/2023   Procedure: PERIPHERAL VASCULAR BALLOON ANGIOPLASTY;  Surgeon: Nada Libman, MD;  Location: [facility];  Service: Cardiovascular;;   PERIPHERAL VASCULAR THROMBECTOMY N/A 08/11/2023  Procedure: PERIPHERAL VASCULAR THROMBECTOMY VENOUS;  Surgeon: Nada Libman, MD;  Location: [facility];  Service: Cardiovascular;  Laterality: N/A;   throat polyps     TUBAL LIGATION      SOCIAL HISTORY: Social History   Socioeconomic History   Marital status: Married    Spouse name: Molly Maduro   Number of children: 2   Years of education: Not on file   Highest education level: GED or equivalent  Occupational History   Not on file  Tobacco Use   Smoking status: Former    Current packs/day: 1.00     Average packs/day: 1 pack/day for 40.0 years (40.0 ttl pk-yrs)    Types: Cigarettes, E-cigarettes   Smokeless tobacco: Never   Tobacco comments:    currently trying to quit 06-26-17  Vaping Use   Vaping status: Every Day  Substance and Sexual Activity   Alcohol use: No    Alcohol/week: 0.0 standard drinks of alcohol   Drug use: No   Sexual activity: Not on file  Other Topics Concern   Not on file  Social History Narrative   Lives with husband, 2 children   Caffeine- coffee 1 cup daily   Social Determinants of Health   Financial Resource Strain: Not on file  Food Insecurity: Not on file  Transportation Needs: Not on file  Physical Activity: Not on file  Stress: Not on file  Social Connections: Not on file  Intimate Partner Violence: Not on file    FAMILY HISTORY: Family History  Problem Relation Age of Onset   Cancer Mother    Stroke Sister     ALLERGIES:  is allergic to rosuvastatin.  MEDICATIONS:  Current Outpatient Medications  Medication Sig Dispense Refill   aspirin EC 81 MG tablet Take 81 mg by mouth daily.     calcium-vitamin D (OSCAL WITH D) 500-200 MG-UNIT tablet Take 1 tablet by mouth daily with breakfast.     Cholecalciferol (VITAMIN D-3) 125 MCG (5000 UT) TABS Take 5,000 Units by mouth daily.     CINNAMON PO Take 1 tablet by mouth daily.     Cyanocobalamin (VITAMIN B-12) 5000 MCG TBDP Take 5,000 mcg by mouth daily.     ELIQUIS 5 MG TABS tablet Take 5 mg by mouth 2 (two) times daily.     JARDIANCE 10 MG TABS tablet Take 20 mg by mouth daily.     latanoprost (XALATAN) 0.005 % ophthalmic solution Place 1 drop into both eyes at bedtime.     lisinopril (ZESTRIL) 2.5 MG tablet Take 2.5 mg by mouth daily.     Multiple Vitamins-Minerals (CENTRUM ULTRA WOMENS PO) Take by mouth.     ondansetron (ZOFRAN-ODT) 4 MG disintegrating tablet Take 4 mg by mouth every 8 (eight) hours as needed for vomiting or nausea.     RESTASIS 0.05 % ophthalmic emulsion Place 1 drop  into both eyes 2 (two) times daily.     traMADol (ULTRAM) 50 MG tablet Take 50 mg by mouth 4 (four) times daily as needed for moderate pain or severe pain.     umeclidinium-vilanterol (ANORO ELLIPTA) 62.5-25 MCG/INH AEPB Inhale 1 puff into the lungs daily.     Wheat Dextrin (BENEFIBER) CHEW Chew 2 tablets by mouth daily.     atorvastatin (LIPITOR) 40 MG tablet Take 1 tablet (40 mg total) by mouth daily. 90 tablet 1   oxybutynin (DITROPAN-XL) 10 MG 24 hr tablet Oral for 90 Days     No current facility-administered medications  for this visit.    REVIEW OF SYSTEMS:   Constitutional: Denies fevers Respiratory: Denies cough, shortness of breath or wheezes Cardiovascular: Denies palpitation, chest discomfort or heart racing sensation Gastrointestinal:  Denies abdominal pain  PHYSICAL EXAMINATION:  Vitals:   10/12/23 1010  BP: 121/67  Pulse: 86  Resp: 18  Temp: 97.7 F (36.5 C)  SpO2: 97%   Filed Weights   10/12/23 1010  Weight: 167 lb 4.8 oz (75.9 kg)    GENERAL: alert, no distress and comfortable SKIN: skin color normal LUNGS: Effort normal and no respiratory distress.  Clear to auscultation HEART: regular rate & rhythm ABDOMEN: abdomen soft, non-tender Musculoskeletal: no cyanosis and edema.  Right lower extremity no edema; left lower extremity no edema  LABORATORY DATA:  I have reviewed the data as listed   RADIOGRAPHIC STUDIES: I have personally reviewed the radiological images as listed and agreed with the findings in the report. No results found.

## 2023-10-09 DIAGNOSIS — L97522 Non-pressure chronic ulcer of other part of left foot with fat layer exposed: Secondary | ICD-10-CM | POA: Diagnosis not present

## 2023-10-09 DIAGNOSIS — E1151 Type 2 diabetes mellitus with diabetic peripheral angiopathy without gangrene: Secondary | ICD-10-CM | POA: Diagnosis not present

## 2023-10-09 DIAGNOSIS — M19071 Primary osteoarthritis, right ankle and foot: Secondary | ICD-10-CM | POA: Diagnosis not present

## 2023-10-09 DIAGNOSIS — M19072 Primary osteoarthritis, left ankle and foot: Secondary | ICD-10-CM | POA: Diagnosis not present

## 2023-10-12 ENCOUNTER — Inpatient Hospital Stay: Payer: Medicare Other

## 2023-10-12 ENCOUNTER — Telehealth: Payer: Self-pay

## 2023-10-12 VITALS — BP 121/67 | HR 86 | Temp 97.7°F | Resp 18 | Wt 167.3 lb

## 2023-10-12 DIAGNOSIS — I82412 Acute embolism and thrombosis of left femoral vein: Secondary | ICD-10-CM | POA: Diagnosis not present

## 2023-10-12 DIAGNOSIS — Z7901 Long term (current) use of anticoagulants: Secondary | ICD-10-CM | POA: Insufficient documentation

## 2023-10-12 DIAGNOSIS — F17290 Nicotine dependence, other tobacco product, uncomplicated: Secondary | ICD-10-CM | POA: Insufficient documentation

## 2023-10-12 DIAGNOSIS — Z809 Family history of malignant neoplasm, unspecified: Secondary | ICD-10-CM | POA: Diagnosis not present

## 2023-10-12 DIAGNOSIS — Z7982 Long term (current) use of aspirin: Secondary | ICD-10-CM | POA: Insufficient documentation

## 2023-10-12 DIAGNOSIS — M7989 Other specified soft tissue disorders: Secondary | ICD-10-CM | POA: Diagnosis not present

## 2023-10-12 DIAGNOSIS — Z79899 Other long term (current) drug therapy: Secondary | ICD-10-CM | POA: Insufficient documentation

## 2023-10-12 DIAGNOSIS — J439 Emphysema, unspecified: Secondary | ICD-10-CM | POA: Insufficient documentation

## 2023-10-12 DIAGNOSIS — E119 Type 2 diabetes mellitus without complications: Secondary | ICD-10-CM | POA: Diagnosis not present

## 2023-10-12 DIAGNOSIS — Z86718 Personal history of other venous thrombosis and embolism: Secondary | ICD-10-CM | POA: Insufficient documentation

## 2023-10-12 DIAGNOSIS — I10 Essential (primary) hypertension: Secondary | ICD-10-CM | POA: Insufficient documentation

## 2023-10-12 DIAGNOSIS — M419 Scoliosis, unspecified: Secondary | ICD-10-CM | POA: Insufficient documentation

## 2023-10-12 DIAGNOSIS — Z87891 Personal history of nicotine dependence: Secondary | ICD-10-CM | POA: Insufficient documentation

## 2023-10-12 DIAGNOSIS — Z9049 Acquired absence of other specified parts of digestive tract: Secondary | ICD-10-CM | POA: Insufficient documentation

## 2023-10-12 DIAGNOSIS — Z823 Family history of stroke: Secondary | ICD-10-CM | POA: Diagnosis not present

## 2023-10-12 DIAGNOSIS — I2510 Atherosclerotic heart disease of native coronary artery without angina pectoris: Secondary | ICD-10-CM | POA: Insufficient documentation

## 2023-10-12 DIAGNOSIS — K5090 Crohn's disease, unspecified, without complications: Secondary | ICD-10-CM | POA: Diagnosis not present

## 2023-10-12 LAB — CBC WITH DIFFERENTIAL (CANCER CENTER ONLY)
Abs Immature Granulocytes: 0.02 10*3/uL (ref 0.00–0.07)
Basophils Absolute: 0.1 10*3/uL (ref 0.0–0.1)
Basophils Relative: 1 %
Eosinophils Absolute: 0.2 10*3/uL (ref 0.0–0.5)
Eosinophils Relative: 2 %
HCT: 49.8 % — ABNORMAL HIGH (ref 36.0–46.0)
Hemoglobin: 16.7 g/dL — ABNORMAL HIGH (ref 12.0–15.0)
Immature Granulocytes: 0 %
Lymphocytes Relative: 26 %
Lymphs Abs: 2.4 10*3/uL (ref 0.7–4.0)
MCH: 30.7 pg (ref 26.0–34.0)
MCHC: 33.5 g/dL (ref 30.0–36.0)
MCV: 91.5 fL (ref 80.0–100.0)
Monocytes Absolute: 0.6 10*3/uL (ref 0.1–1.0)
Monocytes Relative: 7 %
Neutro Abs: 6 10*3/uL (ref 1.7–7.7)
Neutrophils Relative %: 64 %
Platelet Count: 176 10*3/uL (ref 150–400)
RBC: 5.44 MIL/uL — ABNORMAL HIGH (ref 3.87–5.11)
RDW: 13.5 % (ref 11.5–15.5)
WBC Count: 9.2 10*3/uL (ref 4.0–10.5)
nRBC: 0 % (ref 0.0–0.2)

## 2023-10-12 NOTE — Patient Instructions (Signed)
First episode: 07/2023 Acute deep vein thrombosis (DVT) of femoral vein of left lower extremity  Setting: on aspirin 162 mg while developed dvt. Vaping.  Risk factor: Crohn's disease, personal history, DM. Treatment: apixaban twice daily   Recommend continue apixaban 5 mg twice daily until 48 hours before surgery or at surgeon's recommendation Recommend resume apixaban 5 mg twice daily postop when hemostasis. Follow up with cardiology in the next few months on need for aspirin  Follow up in 3 months with me about 2-3 months post hip replacement    Patient education for risk factors and prevention of clotting We talked about modifiable risk factors.  Prevention of clotting like deep vein thrombosis including: Strong risk factors including fractures of lower limb, hospitalization for severe illness, such as heart failure, myocardial infarction, spinal cord injury, major trauma, hip or knee replacement, and previous DVT. avoid prolonged immobilization and moving extremities every 1-2 hours during long car rides or flights.  Taking a break and moving extremities if working in a job setting with prolonged sitting.   Avoid dehydration, especially in high altitude. Avoid cigarette smoking Avoid use of hormone replacement therapy, estrogen-containing contraceptive in women.   Maintaining healthy lifestyle to prevent development of diabetes.  Weight loss if BMI over 30.  Regular exercises but not extreme.   Other risk factors for clotting are surgery, hospitalization, inflammatory disease or severe infection, trauma or injuries from inflammatory state and stasis. If developing one-sided leg swelling, pain, color change, chest pain, sudden short of breath, difficulty taking deep breaths, taking deep breath with chest discomfort or pain, dizziness or heart racing sensation, go to the emergency room immediately for evaluation. If developing trauma, uncontrolled bleeding, such as bloody stools report ED  immediately. Avoid NSAIDs, aspirin while on blood thinner.   Discussed bleeding precautions and avoid high risk activities for falling while on anticoagulation. Anticoagulants do not cause bleeding.  Rather, if bleeding occurs when one is on anticoagulation, it may take longer for the bleeding to stop due to reduce coagulation capacity.

## 2023-10-12 NOTE — Telephone Encounter (Signed)
-----   Message from Melven Sartorius sent at 10/12/2023 12:30 PM EDT ----- Angelica Chessman please let her know no concern from blood counts standpoint on blood thinner or going to surgery. Thanks

## 2023-10-12 NOTE — Telephone Encounter (Signed)
TC to pt to inform of the below message by Dr. Cherly Hensen. Verbalizes understanding.

## 2023-10-12 NOTE — Telephone Encounter (Signed)
Left a message with patient's spouse in regards to scheduled appointment times/dates per [facility] LOS on 10/25

## 2023-10-19 DIAGNOSIS — M19072 Primary osteoarthritis, left ankle and foot: Secondary | ICD-10-CM | POA: Diagnosis not present

## 2023-10-19 DIAGNOSIS — E1151 Type 2 diabetes mellitus with diabetic peripheral angiopathy without gangrene: Secondary | ICD-10-CM | POA: Diagnosis not present

## 2023-10-19 DIAGNOSIS — M792 Neuralgia and neuritis, unspecified: Secondary | ICD-10-CM | POA: Diagnosis not present

## 2023-10-19 DIAGNOSIS — B353 Tinea pedis: Secondary | ICD-10-CM | POA: Diagnosis not present

## 2023-10-19 DIAGNOSIS — M19071 Primary osteoarthritis, right ankle and foot: Secondary | ICD-10-CM | POA: Diagnosis not present

## 2023-10-19 DIAGNOSIS — L97522 Non-pressure chronic ulcer of other part of left foot with fat layer exposed: Secondary | ICD-10-CM | POA: Diagnosis not present

## 2023-10-22 DIAGNOSIS — M1611 Unilateral primary osteoarthritis, right hip: Secondary | ICD-10-CM | POA: Diagnosis not present

## 2023-10-25 NOTE — Progress Notes (Addendum)
Cardiology Office Note    Date:  10/28/2023  ID:  [name], DOB 12-24-1940, MRN [id number] PCP:  Ana Montana, MD  Cardiologist:  Ana Si, MD  Electrophysiologist:  None   Chief Complaint: Follow up for CAD and preoperative examination   History of Present Illness: .    [name] is a 82 y.o. 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  Patient initially seen in 11/2017 after CT scan for lung cancer screening in 10/2017 incidentally showed three-vessel coronary calcifications and a 4.1 cm ascending aortic aneurysm.  At that time she denied any cardiac symptoms so no stress test was pursued.  She later had 1 episode of atypical chest pain at rest.  She was referred for exercise Myoview in 02/2018 which showed no evidence of ischemia.  She was able to achieve 7 METS via Bruce protocol.  Echo in 2020 indicated LVEF of 60 to 65% and normal GLS.  She was last seen in clinic by [name] in 2022, she had remained stable from a cardiac perspective.  Echocardiogram on 12/13/2021 showed LVEF 55 to 60%, LV with normal function, no RWMA, mild concentric LVH, aortic valve sclerosis with no evidence of stenosis, dilation of the ascending aorta at 40 mm.  On 07/29/2023 cardiology office received request for surgical clearance for a right hip replacement.  When calling patient to schedule an office appointment her husband notified us that she had developed a blood clot in her left leg.  On 08/11/2023 she underwent successful mechanical venous thrombectomy, as noted the majority of the clot was in the common and external iliac vein.  She was started on Eliquis 5 mg twice daily with plans to continue for 3 months.  She was seen in clinic by Dr. Myra Wiggins on 09/21/2023, as no etiology for her DVT was  found she was referred to hematology.   Today she presents for follow-up and for preoperative evaluation.  She reports that she is doing well, she reports that she is looking forward to having her hip replaced, she notes that her only complaint is ongoing hip pain.  She denies any further problems regarding her DVT and starting on Eliquis, she denies any bleeding problems.  She notes that her activity is somewhat limited by her hip pain, she is able to meet greater than 4 METS of activity.  She also notes that prior to her DVT she was regularly doing exercises with exercise bands and was tolerating very well.  She denies any chest pain.  She does note that she has some chronic shortness of breath that is unchanged related to COPD.  Labwork independently reviewed: 10/12/2023: Hemoglobin 16.7, hematocrit 49.8 08/11/2023: Sodium 141, potassium 4.3, creatinine 0.90  ROS: .   Today she denies chest pain, lower extremity edema, fatigue, palpitations, melena, hematuria, hemoptysis, diaphoresis, weakness, presyncope, syncope, orthopnea, and PND.  All other systems are reviewed and otherwise negative. Studies Reviewed: [name]    EKG:  EKG is ordered today, personally reviewed, demonstrating  EKG Interpretation Date/Time:  Wednesday October 28 2023 10:17:52 EST Ventricular Rate:  78 PR Interval:  174 QRS Duration:  136 QT Interval:  438 QTC Calculation: 499 R Axis:   -83  Text Interpretation: Normal sinus rhythm Right bundle branch block Left anterior fascicular block Bifascicular block When compared with ECG of 05-Aug-2023  14:00, No significant change was found Confirmed by [name] [id number]) on 10/28/2023 11:51:29 AM   CV Studies:  Cardiac Studies & Procedures     STRESS TESTS  MYOCARDIAL PERFUSION IMAGING 02/17/2018  Narrative  Nuclear stress EF: 58%.  The left ventricular ejection fraction is normal (55-65%).  Blood pressure demonstrated a normal response to exercise.  There was no ST  segment deviation noted during stress.  No T wave inversion was noted during stress.  This is a low risk study.  No reversible ischemia. LVEF 58% with normal wall motion. This is a low risk study.   ECHOCARDIOGRAM  ECHOCARDIOGRAM COMPLETE 12/13/2021  Narrative ECHOCARDIOGRAM REPORT    Patient Name:   [name] Date of Exam: 12/13/2021 Medical Rec #:  [id number]     Height:       63.0 in Accession #:    [id number]    Weight:       166.7 lb Date of Birth:  August 10, 1941     BSA:          1.790 m Patient Age:    80 years      BP:           148/84 mmHg Patient Gender: F             HR:           84 bpm. 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 CAD, COPD, Signs/Symptoms:Dyspnea; Risk Factors:Hypertension, Former Smoker and Diabetes. Patient denies chest pain and leg edema. She does have DOE for many years.  Sonographer:    Carlos American RVT, RDCS (AE), RDMS Referring Phys: [id number] [facility] [place]   Sonographer Comments: Image acquisition challenging due to COPD and Image acquisition challenging due to respiratory motion. IMPRESSIONS   1. Left ventricular ejection fraction, by estimation, is 55 to 60%. Left ventricular ejection fraction by 3D volume is 59 %. The left ventricle has normal function. The left ventricle has no regional wall motion abnormalities. There is mild concentric left ventricular hypertrophy. Impaired relaxation with elevated left atrial pressure. 2. Right ventricular systolic function is normal. The right ventricular size is normal. 3. The mitral valve is normal in structure. No evidence of mitral valve regurgitation. No evidence of mitral stenosis. 4. The aortic valve is normal in structure. Aortic valve regurgitation is not visualized.  Aortic valve sclerosis/calcification is present, without any evidence of aortic stenosis. 5. There is mild calcification of the aortic root. Aneurysm of the ascending aorta, measuring 40 mm. 6. The inferior vena cava is normal in size with greater than 50% respiratory variability, suggesting right atrial pressure of 3 mmHg.  Comparison(s): A prior study was performed on 09/2019. No significant change from prior study. EF 60%, thoracic aorta 41mm.  FINDINGS Left Ventricle: Left ventricular ejection fraction, by estimation, is 55 to 60%. Left ventricular ejection fraction by 3D volume is 59 %. The left ventricle has normal function. The left ventricle has no regional wall motion abnormalities. Global longitudinal Wiggins performed but not reported based on interpreter judgement due to suboptimal tracking. The left ventricular internal cavity size was normal in size. There is mild concentric left ventricular hypertrophy. Impaired relaxation with elevated left atrial pressure.  Right Ventricle: The right ventricular size is normal. No increase in right  ventricular wall thickness. Right ventricular systolic function is normal.  Left Atrium: Left atrial size was normal in size.  Right Atrium: Right atrial size was normal in size.  Pericardium: There is no evidence of pericardial effusion. Presence of epicardial fat layer.  Mitral Valve: The mitral valve is normal in structure. Mild mitral annular calcification. No evidence of mitral valve regurgitation. No evidence of mitral valve stenosis.  Tricuspid Valve: The tricuspid valve is normal in structure. Tricuspid valve regurgitation is not demonstrated. No evidence of tricuspid stenosis.  Aortic Valve: The aortic valve is normal in structure. There is mild aortic valve annular calcification. Aortic valve regurgitation is not visualized. Aortic valve sclerosis/calcification is present, without any evidence of aortic stenosis. Aortic valve mean gradient  measures 5.0 mmHg. Aortic valve peak gradient measures 9.6 mmHg. Aortic valve area, by VTI measures 2.03 cm.  Pulmonic Valve: The pulmonic valve was not well visualized. Pulmonic valve regurgitation is trivial. No evidence of pulmonic stenosis.  Aorta: There is mild calcification of the aortic root. There is an aneurysm involving the ascending aorta measuring 40 mm.  Venous: The inferior vena cava is normal in size with greater than 50% respiratory variability, suggesting right atrial pressure of 3 mmHg.  IAS/Shunts: No atrial level shunt detected by color flow Doppler.   LEFT VENTRICLE PLAX 2D LVIDd:         3.66 cm         Diastology LVIDs:         2.26 cm         LV e' medial:    3.37 cm/s LV PW:         1.03 cm         LV E/e' medial:  21.2 LV IVS:        1.01 cm         LV e' lateral:   5.36 cm/s LVOT diam:     2.10 cm         LV E/e' lateral: 13.3 LV SV:         63 LV SV Index:   35 LVOT Area:     3.46 cm        3D Volume EF LV 3D EF:    Left ventricul LV Volumes (MOD)                            ar LV vol d, MOD    55.0 ml                    ejection A2C:                                        fraction LV vol d, MOD    68.1 ml                    by 3D A4C:                                        volume is LV vol s, MOD    21.0 ml                    59 %. A2C: LV vol s, MOD  23.7 ml A4C:                           3D Volume EF: LV SV MOD A2C:   34.0 ml       3D EF:        59 % LV SV MOD A4C:   68.1 ml       LV EDV:       93 ml LV SV MOD BP:    38.3 ml       LV ESV:       38 ml LV SV:        55 ml  RIGHT VENTRICLE RV S prime:     11.40 cm/s TAPSE (M-mode): 2.5 cm  LEFT ATRIUM             Index        RIGHT ATRIUM           Index LA diam:        3.30 cm 1.84 cm/m   RA Area:     11.60 cm LA Vol (A2C):   66.2 ml 36.99 ml/m  RA Volume:   24.20 ml  13.52 ml/m LA Vol (A4C):   34.4 ml 19.22 ml/m LA Biplane Vol: 48.8 ml 27.27 ml/m AORTIC VALVE                      PULMONIC VALVE AV Area (Vmax):    1.76 cm      PV Vmax:          0.79 m/s AV Area (Vmean):   1.80 cm      PV Peak grad:     2.5 mmHg AV Area (VTI):     2.03 cm      PR End Diast Vel: 1.87 msec AV Vmax:           155.00 cm/s AV Vmean:          103.000 cm/s AV VTI:            0.309 m AV Peak Grad:      9.6 mmHg AV Mean Grad:      5.0 mmHg LVOT Vmax:         78.60 cm/s LVOT Vmean:        53.400 cm/s LVOT VTI:          0.181 m LVOT/AV VTI ratio: 0.59  AORTA Ao Root diam: 3.00 cm Ao Asc diam:  4.00 cm Ao Arch diam: 2.6 cm  MITRAL VALVE                TRICUSPID VALVE MV Area (PHT): 2.19 cm     TR Peak grad:   21.0 mmHg MV Decel Time: 347 msec     TR Vmax:        229.00 cm/s MV E velocity: 71.50 cm/s MV A velocity: 116.00 cm/s  SHUNTS MV E/A ratio:  0.62         Systemic VTI:  0.18 m Systemic Diam: 2.10 cm  Kardie Tobb DO Electronically signed by Thomasene Ripple DO Signature Date/Time: 12/13/2021/3:32:08 PM    Final              Current Reported Medications:.    Current Meds  Medication Sig   aspirin EC 81 MG tablet Take 81 mg by mouth daily.   calcium-vitamin D (OSCAL WITH D) 500-200 MG-UNIT tablet Take 1 tablet by mouth daily  with breakfast.   Cholecalciferol (VITAMIN D-3) 125 MCG (5000 UT) TABS Take 5,000 Units by mouth daily.   CINNAMON PO Take 1 tablet by mouth daily.   Cyanocobalamin (VITAMIN B-12) 5000 MCG TBDP Take 5,000 mcg by mouth daily.   ELIQUIS 5 MG TABS tablet Take 5 mg by mouth 2 (two) times daily.   JARDIANCE 10 MG TABS tablet Take 20 mg by mouth daily.   latanoprost (XALATAN) 0.005 % ophthalmic solution Place 1 drop into both eyes at bedtime.   lisinopril (ZESTRIL) 2.5 MG tablet Take 2.5 mg by mouth daily.   Multiple Vitamins-Minerals (CENTRUM ULTRA WOMENS PO) Take by mouth.   ondansetron (ZOFRAN-ODT) 4 MG disintegrating tablet Take 4 mg by mouth every 8 (eight) hours as needed for vomiting or nausea.   oxybutynin (DITROPAN-XL) 10 MG 24 hr tablet Oral  for 90 Days   RESTASIS 0.05 % ophthalmic emulsion Place 1 drop into both eyes 2 (two) times daily.   traMADol (ULTRAM) 50 MG tablet Take 50 mg by mouth 4 (four) times daily as needed for moderate pain or severe pain.   umeclidinium-vilanterol (ANORO ELLIPTA) 62.5-25 MCG/INH AEPB Inhale 1 puff into the lungs daily.   Wheat Dextrin (BENEFIBER) CHEW Chew 2 tablets by mouth daily.   Physical Exam:    VS:  BP 124/72   Pulse 71   Ht 5' 3.6" (1.615 m)   Wt 170 lb 3.2 oz (77.2 kg)   SpO2 94%   BMI 29.58 kg/m    Wt Readings from Last 3 Encounters:  10/28/23 170 lb 3.2 oz (77.2 kg)  10/12/23 167 lb 4.8 oz (75.9 kg)  09/21/23 162 lb (73.5 kg)    GEN: Well nourished, well developed in no acute distress NECK: No JVD; No carotid bruits CARDIAC: RRR, no murmurs, rubs, gallops RESPIRATORY:  Clear to auscultation without rales, wheezing or rhonchi  ABDOMEN: Soft, non-tender, non-distended EXTREMITIES:  No edema; No acute deformity   Asessement and Plan:.    Coronary artery disease: Incidentally noted on CT scan, Myoview in 02/2018 which showed no evidence of ischemia. Today she denies chest pain or new shortness of breath, she notes she does have COPD and has some baseline dyspnea that is unchanged. No indication for ischemic evaluation.  EKG today shows sinus rhythm with RBBB and LAFB, chronic and stable. Her last lipid profile on 11/27/2022 indicated total cholesterol 124, triglycerides 128, HDL 45 and LDL 56.  Continue Lipitor 40 mg daily. No longer on ASA given need for Eliquis. On lisinopril 2.5 mg daily.   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  She was started on Eliquis 5 mg twice daily. Now followed by hematology. Denies bleeding problems on Eliquis.   Shortness of breath: Patient endorses chronic and stable shortness of breath that she feels is related to her COPD.  As we are  obtaining echocardiogram for monitoring of ascending aorta dilation this will also allow evaluation of heart structure and function to evaluate possible cardiac component.   Hypertension: Blood pressure today 124/72. Continue current antihypertensive regimen.   Ascending aorta aneurysm: Echocardiogram in 2020 indicated dilation of the ascending aorta at 40 mm. Will repeat echocardiogram for monitoring  Tobacco use: Quit smoking six weeks ago. Congratulated on cessation.   Preoperative cardiac evaluation: Ms. Blacksher perioperative risk of a major cardiac event is 0.4% according to the Revised Cardiac Risk Index (RCRI).  Therefore,  she is at low risk for perioperative complications.   Her functional capacity is fair at 5.07 METs according to the Ana Activity Status Index (DASI). Given history of ascending aorta dilation and shortness of breath will have her repeat echocardiogram prior to surgery. Once echocardiogram has resulted cardiac examination and recommendations will be updated and sent to requesting surgeon.  ADDENDUM 11/17/23: Patient's echocardiogram shows no significant change from prior, she is at acceptable risk for surgery without any further cardiac testing.  Patient is no longer on aspirin given need for Eliquis.  Recommendations regarding holding of Eliquis should come from her oncologist, who is currently managing her DVT.  Disposition: F/u with [name] in 6 months or sooner if needed.  Signed, Rip Harbour, NP

## 2023-10-28 ENCOUNTER — Ambulatory Visit: Payer: Medicare Other | Attending: Cardiology | Admitting: Cardiology

## 2023-10-28 ENCOUNTER — Encounter: Payer: Self-pay | Admitting: Cardiology

## 2023-10-28 VITALS — BP 124/72 | HR 71 | Ht 63.6 in | Wt 170.2 lb

## 2023-10-28 DIAGNOSIS — I10 Essential (primary) hypertension: Secondary | ICD-10-CM | POA: Diagnosis not present

## 2023-10-28 DIAGNOSIS — Z01810 Encounter for preprocedural cardiovascular examination: Secondary | ICD-10-CM | POA: Diagnosis not present

## 2023-10-28 DIAGNOSIS — Z720 Tobacco use: Secondary | ICD-10-CM | POA: Diagnosis not present

## 2023-10-28 DIAGNOSIS — I7121 Aneurysm of the ascending aorta, without rupture: Secondary | ICD-10-CM | POA: Diagnosis not present

## 2023-10-28 DIAGNOSIS — I2510 Atherosclerotic heart disease of native coronary artery without angina pectoris: Secondary | ICD-10-CM | POA: Diagnosis not present

## 2023-10-28 DIAGNOSIS — R0602 Shortness of breath: Secondary | ICD-10-CM | POA: Insufficient documentation

## 2023-10-28 DIAGNOSIS — I82412 Acute embolism and thrombosis of left femoral vein: Secondary | ICD-10-CM | POA: Diagnosis not present

## 2023-10-28 NOTE — Patient Instructions (Signed)
Medication Instructions:  No changes *If you need a refill on your cardiac medications before your next appointment, please call your pharmacy*  Lab Work: No labs If you have labs (blood work) drawn today and your tests are completely normal, you will receive your results only by: MyChart Message (if you have MyChart) OR A paper copy in the mail If you have any lab test that is abnormal or we need to change your treatment, we will call you to review the results.  Testing/Procedures: Your physician has requested that you have an echocardiogram. Echocardiography is a painless test that uses sound waves to create images of your heart. It provides your doctor with information about the size and shape of your heart and how well your heart's chambers and valves are working. This procedure takes approximately one hour. There are no restrictions for this procedure. Please do NOT wear cologne, perfume, aftershave, or lotions (deodorant is allowed). Please arrive 15 minutes prior to your appointment time.  Please note: We ask at that you not bring children with you during ultrasound (echo/ vascular) testing. Due to room size and safety concerns, children are not allowed in the ultrasound rooms during exams. Our front office staff cannot provide observation of children in our lobby area while testing is being conducted. An adult accompanying a patient to their appointment will only be allowed in the ultrasound room at the discretion of the ultrasound technician under special circumstances. We apologize for any inconvenience.  Follow-Up: At [facility], you and your health needs are our priority.  As part of our continuing mission to provide you with exceptional heart care, we have created designated Provider Care Teams.  These Care Teams include your primary Cardiologist (physician) and Advanced Practice Providers (APPs -  Physician Assistants and Nurse Practitioners) who all work together to  provide you with the care you need, when you need it.  We recommend signing up for the patient portal called "MyChart".  Sign up information is provided on this After Visit Summary.  MyChart is used to connect with patients for Virtual Visits (Telemedicine).  Patients are able to view lab/test results, encounter notes, upcoming appointments, etc.  Non-urgent messages can be sent to your provider as well.   To learn more about what you can do with MyChart, go to [email or web address].    Your next appointment:   6 month(s)  Provider:   Chilton Si, MD

## 2023-11-06 ENCOUNTER — Telehealth: Payer: Self-pay

## 2023-11-06 NOTE — Telephone Encounter (Addendum)
TC from pt's husband to inquire about forms that pt's orthopedic surgeon, Dr. Harlin Heys, is requesting before scheduling pt's surgery; he wants to know if Dr. Cherly Hensen has signed the requested form. Discussed w/ Dr. Cherly Hensen and he does not recall signing anything. Husband gave phone number for [facility]'s scheduling coordinator, Lurena Joiner. Called her due to being unable to locate this form [phone]); no answer. Left a VM asking that form be sent directly to Dr. Nelta Numbers nurse's fax number [phone]). Pt's husband notified and states he will follow up on this next week.

## 2023-11-10 ENCOUNTER — Other Ambulatory Visit: Payer: Self-pay | Admitting: Orthopedic Surgery

## 2023-11-10 DIAGNOSIS — B078 Other viral warts: Secondary | ICD-10-CM | POA: Diagnosis not present

## 2023-11-16 ENCOUNTER — Ambulatory Visit (INDEPENDENT_AMBULATORY_CARE_PROVIDER_SITE_OTHER): Payer: Medicare Other

## 2023-11-16 DIAGNOSIS — I7121 Aneurysm of the ascending aorta, without rupture: Secondary | ICD-10-CM

## 2023-11-16 LAB — ECHOCARDIOGRAM COMPLETE
Area-P 1/2: 3.99 cm2
S' Lateral: 2.33 cm

## 2023-11-17 ENCOUNTER — Telehealth: Payer: Self-pay

## 2023-11-17 NOTE — Telephone Encounter (Signed)
Called patient advised of below they verbalized understanding.

## 2023-11-17 NOTE — Telephone Encounter (Signed)
-----   Message from Rip Harbour sent at 11/17/2023 10:10 AM EST ----- Please let [name] know that her echo showed normal heart squeeze and function, there is some mild thickening and stiffening of the left lower chamber of the heart, this is common with aging and a history of hypertension.  There is mild dilation of the ascending aorta. No significant change compared to prior echocardiogram. I will update recommendations regarding her procedure and epic fax to her surgeon.

## 2023-11-18 DIAGNOSIS — M19072 Primary osteoarthritis, left ankle and foot: Secondary | ICD-10-CM | POA: Diagnosis not present

## 2023-11-18 DIAGNOSIS — M19071 Primary osteoarthritis, right ankle and foot: Secondary | ICD-10-CM | POA: Diagnosis not present

## 2023-11-18 DIAGNOSIS — E1151 Type 2 diabetes mellitus with diabetic peripheral angiopathy without gangrene: Secondary | ICD-10-CM | POA: Diagnosis not present

## 2023-11-18 DIAGNOSIS — L97522 Non-pressure chronic ulcer of other part of left foot with fat layer exposed: Secondary | ICD-10-CM | POA: Diagnosis not present

## 2023-11-18 DIAGNOSIS — M792 Neuralgia and neuritis, unspecified: Secondary | ICD-10-CM | POA: Diagnosis not present

## 2023-11-18 DIAGNOSIS — L089 Local infection of the skin and subcutaneous tissue, unspecified: Secondary | ICD-10-CM | POA: Diagnosis not present

## 2023-11-18 DIAGNOSIS — B353 Tinea pedis: Secondary | ICD-10-CM | POA: Diagnosis not present

## 2023-11-19 NOTE — Progress Notes (Addendum)
COVID Vaccine Completed:  Yes  Date of COVID positive in last 90 days:  No  PCP - Laurann Montana, MD Cardiologist - Chilton Si, MD  Cardiac clearance in Epic dated 10-28-23 by Reather Littler, NP  Chest x-ray - N/A EKG - 10-28-23 Epic Stress Test - 02-17-18 Epic ECHO - 11-16-23 Epic Cardiac Cath - N/A Pacemaker/ICD device last checked: Spinal Cord Stimulator:  N/A  Bowel Prep - N/A  Sleep Study - Yes, neg sleep apnea CPAP - No  Fasting Blood Sugar - 125 Checks Blood Sugar - 2 times a day  Last dose of GLP1 agonist-  N/A GLP1 instructions:  Hold 7 days before surgery    Jardiance Last dose of SGLT-2 inhibitors-  11-26-23 SGLT-2 instructions:  Hold 3 days before surgery   Blood Thinner Instructions:  Eliquis (managed by oncology) Per patient to stop 3 days before surgery  Aspirin Instructions: Last Dose:  Activity level:   Can go up a flight of stairs and perform activities of daily living without stopping and without symptoms of chest pain.  Some limitations due to hip pain.  Patient has shortness of breath with exertion, states that it has not worsened.    Anesthesia review:  CAD, AAA, emphysema, COPD, HTN, DM.  RBBB, bifasicular block on EKG  A1c 8.2 on PAT labs  Patient denies shortness of breath, fever, cough and chest pain at PAT appointment  Patient verbalized understanding of instructions that were given to them at the PAT appointment. Patient was also instructed that they will need to review over the PAT instructions again at home before surgery.

## 2023-11-19 NOTE — Patient Instructions (Addendum)
SURGICAL WAITING ROOM VISITATION Patients having surgery or a procedure may have no more than 2 support people in the waiting area - these visitors may rotate.    Children under the age of 20 must have an adult with them who is not the patient.  If the patient needs to stay at the hospital during part of their recovery, the visitor guidelines for inpatient rooms apply. Pre-op nurse will coordinate an appropriate time for 1 support person to accompany patient in pre-op.  This support person may not rotate.    Please refer to the [facility] website for the visitor guidelines for Inpatients (after your surgery is over and you are in a regular room).       Your procedure is scheduled on: 11-30-23   Report to [facility] Main Entrance    Report to admitting at 8:10 AM   Call this number if you have problems the morning of surgery [phone]   Do not eat food :After Midnight.   After Midnight you may have the following liquids until 7:40 AM DAY OF SURGERY  Water Non-Citrus Juices (without pulp, NO RED-Apple, White grape, White cranberry) Black Coffee (NO MILK/CREAM OR CREAMERS, sugar ok)  Clear Tea (NO MILK/CREAM OR CREAMERS, sugar ok) regular and decaf                             Plain Jell-O (NO RED)                                           Fruit ices (not with fruit pulp, NO RED)                                     Popsicles (NO RED)                                                               Sports drinks like Gatorade (NO RED)                   The day of surgery:  Drink ONE (1) Pre-Surgery G2 by 7:40 AM the morning of surgery. Drink in one sitting. Do not sip.  This drink was given to you during your hospital  pre-op appointment visit. Nothing else to drink after completing the Pre-Surgery G2.          If you have questions, please contact your surgeon's office.   FOLLOW ANY ADDITIONAL PRE OP INSTRUCTIONS YOU RECEIVED FROM YOUR SURGEON'S OFFICE!!!      Oral Hygiene is also important to reduce your risk of infection.                                    Remember - BRUSH YOUR TEETH THE MORNING OF SURGERY WITH YOUR REGULAR TOOTHPASTE   Do NOT smoke after Midnight   Take these medicines the morning of surgery with A SIP OF WATER:   Atorvastatin  Oxybutynin  Okay to use inhalers  and eyedrops  If needed Tramadol and Ondansetron  Stop all vitamins and herbal supplements 7 days before surgery  How to Manage Your Diabetes Before and After Surgery  Why is it important to control my blood sugar before and after surgery? Improving blood sugar levels before and after surgery helps healing and can limit problems. A way of improving blood sugar control is eating a healthy diet by:  Eating less sugar and carbohydrates  Increasing activity/exercise  Talking with your doctor about reaching your blood sugar goals High blood sugars (greater than 180 mg/dL) can raise your risk of infections and slow your recovery, so you will need to focus on controlling your diabetes during the weeks before surgery. Make sure that the doctor who takes care of your diabetes knows about your planned surgery including the date and location.  How do I manage my blood sugar before surgery? Check your blood sugar at least 4 times a day, starting 2 days before surgery, to make sure that the level is not too high or low. Check your blood sugar the morning of your surgery when you wake up and every 2 hours until you get to the Short Stay unit. If your blood sugar is less than 70 mg/dL, you will need to treat for low blood sugar: Do not take insulin. Treat a low blood sugar (less than 70 mg/dL) with  cup of clear juice (cranberry or apple), 4 glucose tablets, OR glucose gel. Recheck blood sugar in 15 minutes after treatment (to make sure it is greater than 70 mg/dL). If your blood sugar is not greater than 70 mg/dL on recheck, call [phone] for further instructions. Report  your blood sugar to the short stay nurse when you get to Short Stay.  If you are admitted to the hospital after surgery: Your blood sugar will be checked by the staff and you will probably be given insulin after surgery (instead of oral diabetes medicines) to make sure you have good blood sugar levels. The goal for blood sugar control after surgery is 80-180 mg/dL.   WHAT DO I DO ABOUT MY DIABETES MEDICATION?  Do not take oral diabetes medicines (pills) the morning of surgery.  Hold Jardiance 3 days before surgery (do not take after 11-26-23).      DO NOT TAKE THE FOLLOWING 7 DAYS PRIOR TO SURGERY: Ozempic, Wegovy, Rybelsus (Semaglutide), Byetta (exenatide), Bydureon (exenatide ER), Victoza, Saxenda (liraglutide), or Trulicity (dulaglutide) Mounjaro (Tirzepatide) Adlyxin (Lixisenatide), Polyethylene Glycol Loxenatide.  Reviewed and Endorsed by [facility] Patient Education Committee, August 2015                              You may not have any metal on your body including hair pins, jewelry, and body piercing             Do not wear make-up, lotions, powders, perfumes, or deodorant  Do not wear nail polish including gel and S&S, artificial/acrylic nails, or any other type of covering on natural nails including finger and toenails. If you have artificial nails, gel coating, etc. that needs to be removed by a nail salon please have this removed prior to surgery or surgery may need to be canceled/ delayed if the surgeon/ anesthesia feels like they are unable to be safely monitored.   Do not shave  48 hours prior to surgery.    Do not bring valuables to the hospital. [facility] IS NOT RESPONSIBLE  FOR VALUABLES.   Contacts, dentures or bridgework may not be worn into surgery.   Bring small overnight bag day of surgery.   DO NOT BRING YOUR HOME MEDICATIONS TO THE HOSPITAL. PHARMACY WILL DISPENSE MEDICATIONS LISTED ON YOUR MEDICATION LIST TO YOU DURING YOUR ADMISSION IN THE  HOSPITAL!   Special Instructions: Bring a copy of your healthcare power of attorney and living will documents the day of surgery if you haven't scanned them before.              Please read over the following fact sheets you were given: IF YOU HAVE QUESTIONS ABOUT YOUR PRE-OP INSTRUCTIONS PLEASE CALL [phone] Gwen  If you received a COVID test during your pre-op visit  it is requested that you wear a mask when out in public, stay away from anyone that may not be feeling well and notify your surgeon if you develop symptoms. If you test positive for Covid or have been in contact with anyone that has tested positive in the last 10 days please notify you surgeon.    Pre-operative 5 CHG Bath Instructions   You can play a key role in reducing the risk of infection after surgery. Your skin needs to be as free of germs as possible. You can reduce the number of germs on your skin by washing with CHG (chlorhexidine gluconate) soap before surgery. CHG is an antiseptic soap that kills germs and continues to kill germs even after washing.   DO NOT use if you have an allergy to chlorhexidine/CHG or antibacterial soaps. If your skin becomes reddened or irritated, stop using the CHG and notify one of our RNs at [phone].   Please shower with the CHG soap starting 4 days before surgery using the following schedule:     Please keep in mind the following:  DO NOT shave, including legs and underarms, starting the day of your first shower.   You may shave your face at any point before/day of surgery.  Place clean sheets on your bed the day you start using CHG soap. Use a clean washcloth (not used since being washed) for each shower. DO NOT sleep with pets once you start using the CHG.   CHG Shower Instructions:  If you choose to wash your hair and private area, wash first with your normal shampoo/soap.  After you use shampoo/soap, rinse your hair and body thoroughly to remove shampoo/soap residue.   Turn the water OFF and apply about 3 tablespoons (45 ml) of CHG soap to a CLEAN washcloth.  Apply CHG soap ONLY FROM YOUR NECK DOWN TO YOUR TOES (washing for 3-5 minutes)  DO NOT use CHG soap on face, private areas, open wounds, or sores.  Pay special attention to the area where your surgery is being performed.  If you are having back surgery, having someone wash your back for you may be helpful. Wait 2 minutes after CHG soap is applied, then you may rinse off the CHG soap.  Pat dry with a clean towel  Put on clean clothes/pajamas   If you choose to wear lotion, please use ONLY the CHG-compatible lotions on the back of this paper.     Additional instructions for the day of surgery: DO NOT APPLY any lotions, deodorants, cologne, or perfumes.   Put on clean/comfortable clothes.  Brush your teeth.  Ask your nurse before applying any prescription medications to the skin.      CHG Compatible Lotions   Aveeno Moisturizing lotion  Cetaphil  Moisturizing Cream  Cetaphil Moisturizing Lotion  Clairol Herbal Essence Moisturizing Lotion, Dry Skin  Clairol Herbal Essence Moisturizing Lotion, Extra Dry Skin  Clairol Herbal Essence Moisturizing Lotion, Normal Skin  Curel Age Defying Therapeutic Moisturizing Lotion with Alpha Hydroxy  Curel Extreme Care Body Lotion  Curel Soothing Hands Moisturizing Hand Lotion  Curel Therapeutic Moisturizing Cream, Fragrance-Free  Curel Therapeutic Moisturizing Lotion, Fragrance-Free  Curel Therapeutic Moisturizing Lotion, Original Formula  Eucerin Daily Replenishing Lotion  Eucerin Dry Skin Therapy Plus Alpha Hydroxy Crme  Eucerin Dry Skin Therapy Plus Alpha Hydroxy Lotion  Eucerin Original Crme  Eucerin Original Lotion  Eucerin Plus Crme Eucerin Plus Lotion  Eucerin TriLipid Replenishing Lotion  Keri Anti-Bacterial Hand Lotion  Keri Deep Conditioning Original Lotion Dry Skin Formula Softly Scented  Keri Deep Conditioning Original Lotion, Fragrance  Free Sensitive Skin Formula  Keri Lotion Fast Absorbing Fragrance Free Sensitive Skin Formula  Keri Lotion Fast Absorbing Softly Scented Dry Skin Formula  Keri Original Lotion  Keri Skin Renewal Lotion Keri Silky Smooth Lotion  Keri Silky Smooth Sensitive Skin Lotion  Nivea Body Creamy Conditioning Oil  Nivea Body Extra Enriched Lotion  Nivea Body Original Lotion  Nivea Body Sheer Moisturizing Lotion Nivea Crme  Nivea Skin Firming Lotion  NutraDerm 30 Skin Lotion  NutraDerm Skin Lotion  NutraDerm Therapeutic Skin Cream  NutraDerm Therapeutic Skin Lotion  ProShield Protective Hand Cream  Provon moisturizing lotion   PATIENT SIGNATURE_________________________________  NURSE SIGNATURE__________________________________  ________________________________________________________________________    Rogelia Mire  An incentive spirometer is a tool that can help keep your lungs clear and active. This tool measures how well you are filling your lungs with each breath. Taking long deep breaths may help reverse or decrease the chance of developing breathing (pulmonary) problems (especially infection) following: A long period of time when you are unable to move or be active. BEFORE THE PROCEDURE  If the spirometer includes an indicator to show your best effort, your nurse or respiratory therapist will set it to a desired goal. If possible, sit up straight or lean slightly forward. Try not to slouch. Hold the incentive spirometer in an upright position. INSTRUCTIONS FOR USE  Sit on the edge of your bed if possible, or sit up as far as you can in bed or on a chair. Hold the incentive spirometer in an upright position. Breathe out normally. Place the mouthpiece in your mouth and seal your lips tightly around it. Breathe in slowly and as deeply as possible, raising the piston or the ball toward the top of the column. Hold your breath for 3-5 seconds or for as long as possible. Allow the  piston or ball to fall to the bottom of the column. Remove the mouthpiece from your mouth and breathe out normally. Rest for a few seconds and repeat Steps 1 through 7 at least 10 times every 1-2 hours when you are awake. Take your time and take a few normal breaths between deep breaths. The spirometer may include an indicator to show your best effort. Use the indicator as a goal to work toward during each repetition. After each set of 10 deep breaths, practice coughing to be sure your lungs are clear. If you have an incision (the cut made at the time of surgery), support your incision when coughing by placing a pillow or rolled up towels firmly against it. Once you are able to get out of bed, walk around indoors and cough well. You may stop using the incentive spirometer when instructed by  your caregiver.  RISKS AND COMPLICATIONS Take your time so you do not get dizzy or light-headed. If you are in pain, you may need to take or ask for pain medication before doing incentive spirometry. It is harder to take a deep breath if you are having pain. AFTER USE Rest and breathe slowly and easily. It can be helpful to keep track of a log of your progress. Your caregiver can provide you with a simple table to help with this. If you are using the spirometer at home, follow these instructions: SEEK MEDICAL CARE IF:  You are having difficultly using the spirometer. You have trouble using the spirometer as often as instructed. Your pain medication is not giving enough relief while using the spirometer. You develop fever of 100.5 F (38.1 C) or higher. SEEK IMMEDIATE MEDICAL CARE IF:  You cough up bloody sputum that had not been present before. You develop fever of 102 F (38.9 C) or greater. You develop worsening pain at or near the incision site. MAKE SURE YOU:  Understand these instructions. Will watch your condition. Will get help right away if you are not doing well or get worse. Document  Released: 04/13/2007 Document Revised: 02/23/2012 Document Reviewed: 06/14/2007 [employer] Patient Information 2014 [employer], [place].   ________________________________________________________________________ WHAT IS A BLOOD TRANSFUSION? Blood Transfusion Information  A transfusion is the replacement of blood or some of its parts. Blood is made up of multiple cells which provide different functions. Red blood cells carry oxygen and are used for blood loss replacement. White blood cells fight against infection. Platelets control bleeding. Plasma helps clot blood. Other blood products are available for specialized needs, such as hemophilia or other clotting disorders. BEFORE THE TRANSFUSION  Who gives blood for transfusions?  Healthy volunteers who are fully evaluated to make sure their blood is safe. This is blood bank blood. Transfusion therapy is the safest it has ever been in the practice of medicine. Before blood is taken from a donor, a complete history is taken to make sure that person has no history of diseases nor engages in risky social behavior (examples are intravenous drug use or sexual activity with multiple partners). The donor's travel history is screened to minimize risk of transmitting infections, such as malaria. The donated blood is tested for signs of infectious diseases, such as HIV and hepatitis. The blood is then tested to be sure it is compatible with you in order to minimize the chance of a transfusion reaction. If you or a relative donates blood, this is often done in anticipation of surgery and is not appropriate for emergency situations. It takes many days to process the donated blood. RISKS AND COMPLICATIONS Although transfusion therapy is very safe and saves many lives, the main dangers of transfusion include:  Getting an infectious disease. Developing a transfusion reaction. This is an allergic reaction to something in the blood you were given. Every precaution is taken  to prevent this. The decision to have a blood transfusion has been considered carefully by your caregiver before blood is given. Blood is not given unless the benefits outweigh the risks. AFTER THE TRANSFUSION Right after receiving a blood transfusion, you will usually feel much better and more energetic. This is especially true if your red blood cells have gotten low (anemic). The transfusion raises the level of the red blood cells which carry oxygen, and this usually causes an energy increase. The nurse administering the transfusion will monitor you carefully for complications. HOME CARE INSTRUCTIONS  No  special instructions are needed after a transfusion. You may find your energy is better. Speak with your caregiver about any limitations on activity for underlying diseases you may have. SEEK MEDICAL CARE IF:  Your condition is not improving after your transfusion. You develop redness or irritation at the intravenous (IV) site. SEEK IMMEDIATE MEDICAL CARE IF:  Any of the following symptoms occur over the next 12 hours: Shaking chills. You have a temperature by mouth above 102 F (38.9 C), not controlled by medicine. Chest, back, or muscle pain. People around you feel you are not acting correctly or are confused. Shortness of breath or difficulty breathing. Dizziness and fainting. You get a rash or develop hives. You have a decrease in urine output. Your urine turns a dark color or changes to pink, red, or brown. Any of the following symptoms occur over the next 10 days: You have a temperature by mouth above 102 F (38.9 C), not controlled by medicine. Shortness of breath. Weakness after normal activity. The white part of the eye turns yellow (jaundice). You have a decrease in the amount of urine or are urinating less often. Your urine turns a dark color or changes to pink, red, or brown. Document Released: 11/28/2000 Document Revised: 02/23/2012 Document Reviewed:  07/17/2008 [facility] Patient Information 2014 [place], [place].  _______________________________________________________________________

## 2023-11-20 ENCOUNTER — Encounter (HOSPITAL_COMMUNITY): Payer: Self-pay

## 2023-11-20 ENCOUNTER — Other Ambulatory Visit: Payer: Self-pay

## 2023-11-20 ENCOUNTER — Encounter (HOSPITAL_COMMUNITY)
Admission: RE | Admit: 2023-11-20 | Discharge: 2023-11-20 | Disposition: A | Discharge status: Home / Self Care | Payer: Medicare Other | Source: Ambulatory Visit | Attending: Orthopedic Surgery | Admitting: Orthopedic Surgery

## 2023-11-20 VITALS — BP 129/76 | HR 54 | Temp 98.3°F | Resp 20 | Ht 63.5 in | Wt 169.0 lb

## 2023-11-20 DIAGNOSIS — Z01812 Encounter for preprocedural laboratory examination: Secondary | ICD-10-CM | POA: Insufficient documentation

## 2023-11-20 DIAGNOSIS — E119 Type 2 diabetes mellitus without complications: Secondary | ICD-10-CM | POA: Diagnosis not present

## 2023-11-20 DIAGNOSIS — Z01818 Encounter for other preprocedural examination: Secondary | ICD-10-CM

## 2023-11-20 HISTORY — DX: Dyspnea, unspecified: R06.00

## 2023-11-20 HISTORY — DX: Personal history of urinary calculi: Z87.442

## 2023-11-20 LAB — CBC
HCT: 51.2 % — ABNORMAL HIGH (ref 36.0–46.0)
Hemoglobin: 16.8 g/dL — ABNORMAL HIGH (ref 12.0–15.0)
MCH: 30.5 pg (ref 26.0–34.0)
MCHC: 32.8 g/dL (ref 30.0–36.0)
MCV: 93.1 fL (ref 80.0–100.0)
Platelets: 173 10*3/uL (ref 150–400)
RBC: 5.5 MIL/uL — ABNORMAL HIGH (ref 3.87–5.11)
RDW: 13.7 % (ref 11.5–15.5)
WBC: 8 10*3/uL (ref 4.0–10.5)
nRBC: 0 % (ref 0.0–0.2)

## 2023-11-20 LAB — BASIC METABOLIC PANEL
Anion gap: 11 (ref 5–15)
BUN: 26 mg/dL — ABNORMAL HIGH (ref 8–23)
CO2: 20 mmol/L — ABNORMAL LOW (ref 22–32)
Calcium: 9.4 mg/dL (ref 8.9–10.3)
Chloride: 109 mmol/L (ref 98–111)
Creatinine, Ser: 0.89 mg/dL (ref 0.44–1.00)
GFR, Estimated: >60 mL/min (ref >60)
Glucose, Bld: 178 mg/dL — ABNORMAL HIGH (ref 70–99)
Potassium: 4 mmol/L (ref 3.5–5.1)
Sodium: 140 mmol/L (ref 135–145)

## 2023-11-20 LAB — SURGICAL PCR SCREEN
MRSA, PCR: NEGATIVE
Staphylococcus aureus: POSITIVE — AB

## 2023-11-20 LAB — HEMOGLOBIN A1C
Hgb A1c MFr Bld: 8.2 % — ABNORMAL HIGH (ref 4.8–5.6)
Mean Plasma Glucose: 188.64 mg/dL

## 2023-11-20 LAB — GLUCOSE, CAPILLARY: Glucose-Capillary: 206 mg/dL — ABNORMAL HIGH (ref 70–99)

## 2023-11-20 NOTE — Progress Notes (Signed)
PCR results sent to Dr. Rowan to review. 

## 2023-11-24 ENCOUNTER — Encounter (HOSPITAL_COMMUNITY): Payer: Self-pay | Admitting: Physician Assistant

## 2023-11-24 DIAGNOSIS — E1140 Type 2 diabetes mellitus with diabetic neuropathy, unspecified: Secondary | ICD-10-CM | POA: Diagnosis not present

## 2023-11-24 NOTE — Progress Notes (Signed)
Anesthesia Chart Review   Case: [id number] Date/Time: 11/30/23 1026   Procedure: RIGHT TOTAL HIP ARTHROPLASTY ANTERIOR APPROACH (Right: Hip)   Anesthesia type: Spinal   Pre-op diagnosis: RIGHT HIP OSTEOARTHRITIS   Location: [facility] ROOM 07 / [facility]   Surgeons: Gean Birchwood, MD       DISCUSSION:82 y.o. former smoker with h/o COPD, HTN, DM II, CAD, right hip OA scheduled for above procedure 11/30/2023 with Dr. Gean Birchwood.   Pt last seen by cardiology 10/28/2023. Per OV note, "Preoperative cardiac evaluation: [name] perioperative risk of a major cardiac event is 0.4% according to the Revised Cardiac Risk Index (RCRI).  Therefore, she is at low risk for perioperative complications.   Her functional capacity is fair at 5.07 METs according to the Duke Activity Status Index (DASI). Given history of ascending aorta dilation and shortness of breath will have her repeat echocardiogram prior to surgery. Once echocardiogram has resulted cardiac examination and recommendations will be updated and sent to requesting surgeon.  ADDENDUM 11/17/23: Patient's echocardiogram shows no significant change from prior, she is at acceptable risk for surgery without any further cardiac testing.  Patient is no longer on aspirin given need for Eliquis.  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 Forwarded to Careers adviser.  VS: BP 129/76   Pulse (!) 54   Temp 36.8 C (Oral)   Resp 20   Ht 5' 3.5" (1.613 m)   Wt 76.7 kg   SpO2 95%   BMI 29.47 kg/m   PROVIDERS: Laurann Montana, MD is PCP   Cardiologist:  Chilton Si, MD  LABS:  forwarded to surgeon and PCP  (all labs ordered are listed, but only abnormal results are displayed)  Labs Reviewed  SURGICAL PCR SCREEN - Abnormal; Notable for the following components:      Result Value   Staphylococcus aureus POSITIVE (*)    All other components within normal limits   HEMOGLOBIN A1C - Abnormal; Notable for the following components:   Hgb A1c MFr Bld 8.2 (*)    All other components within normal limits  BASIC METABOLIC PANEL - Abnormal; Notable for the following components:   CO2 20 (*)    Glucose, Bld 178 (*)    BUN 26 (*)    All other components within normal limits  CBC - Abnormal; Notable for the following components:   RBC 5.50 (*)    Hemoglobin 16.8 (*)    HCT 51.2 (*)    All other components within normal limits  GLUCOSE, CAPILLARY - Abnormal; Notable for the following components:   Glucose-Capillary 206 (*)    All other components within normal limits  TYPE AND SCREEN     IMAGES:   EKG:   CV: Echo 11/16/23  1. Left ventricular ejection fraction, by estimation, is 55 to 60%. The  left ventricle has normal function. The left ventricle has no regional  wall motion abnormalities. There is mild concentric left ventricular  hypertrophy. Left ventricular diastolic  parameters are consistent with Grade I diastolic dysfunction (impaired  relaxation).   2. Right ventricular systolic function is normal. The right ventricular  size is normal. Tricuspid regurgitation signal is inadequate for assessing  PA pressure.   3. The mitral valve is normal in structure. No evidence of mitral valve  regurgitation.   4. The aortic valve is tricuspid. Aortic valve regurgitation is not  visualized.   5. There is  mild dilatation of the ascending aorta, measuring 40 mm.   6. The inferior vena cava is normal in size with greater than 50%  respiratory variability, suggesting right atrial pressure of 3 mmHg.   Comparison(s): No significant change from prior study. Prior images  reviewed side by side.   Myocardial Perfusion 02/17/2018 Nuclear stress EF: 58%. The left ventricular ejection fraction is normal (55-65%). Blood pressure demonstrated a normal response to exercise. There was no ST segment deviation noted during stress. No T wave inversion was  noted during stress. This is a low risk study.   No reversible ischemia. LVEF 58% with normal wall motion. This is a low risk study. 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    COPD (chronic obstructive pulmonary disease) (HCC)    Crohn's disease (HCC)    Diabetes mellitus without complication (HCC)    DVT (deep venous thrombosis) (HCC)    Dyspnea    Essential hypertension 11/20/2017   Gait difficulty    History of kidney stones    OAB (overactive bladder)    Smoker    Tobacco abuse 11/20/2017   Vertigo     Past Surgical History:  Procedure Laterality Date   BLADDER SURGERY     CHOLECYSTECTOMY     FOOT ARTHRODESIS Left 07/02/2017   Procedure: Left Subtalar Arthrodesis;  Surgeon: Toni Arthurs, MD;  Location: [facility];  Service: Orthopedics;  Laterality: Left;   FRACTURE SURGERY     HARDWARE REMOVAL Left 04/24/2016   Procedure: REMOVAL OF DEEP IMPLANT LEFT FOOT;  Surgeon: Toni Arthurs, MD;  Location: [facility];  Service: Orthopedics;  Laterality: Left;  ANESTHESIA:  REGIONAL/MONITOR ANESTHESIA   HARDWARE REMOVAL Left 07/02/2017   Procedure: Removal of Deep Implants;  Surgeon: Toni Arthurs, MD;  Location: [facility];  Service: Orthopedics;  Laterality: Left;   JOINT REPLACEMENT     knee   PERIPHERAL VASCULAR BALLOON ANGIOPLASTY  08/11/2023   Procedure: PERIPHERAL VASCULAR BALLOON ANGIOPLASTY;  Surgeon: Nada Libman, MD;  Location: [facility];  Service: Cardiovascular;;   PERIPHERAL VASCULAR THROMBECTOMY N/A 08/11/2023   Procedure: PERIPHERAL VASCULAR THROMBECTOMY VENOUS;  Surgeon: Nada Libman, MD;  Location: [facility];  Service: Cardiovascular;  Laterality: N/A;   throat polyps     TUBAL LIGATION      MEDICATIONS:  atorvastatin (LIPITOR) 40 MG  tablet   calcium-vitamin D (OSCAL WITH D) 500-200 MG-UNIT tablet   Cholecalciferol (VITAMIN D-3) 125 MCG (5000 UT) TABS   Cyanocobalamin (VITAMIN B-12) 5000 MCG TBDP   ELIQUIS 5 MG TABS tablet   JARDIANCE 10 MG TABS tablet   latanoprost (XALATAN) 0.005 % ophthalmic solution   lisinopril (ZESTRIL) 2.5 MG tablet   Omega-3 Fatty Acids (FISH OIL) 1000 MG CAPS   ondansetron (ZOFRAN-ODT) 4 MG disintegrating tablet   oxybutynin (DITROPAN-XL) 10 MG 24 hr tablet   RESTASIS 0.05 % ophthalmic emulsion   traMADol (ULTRAM) 50 MG tablet   umeclidinium-vilanterol (ANORO ELLIPTA) 62.5-25 MCG/INH AEPB   Wheat Dextrin (BENEFIBER) CHEW   No current facility-administered medications for this encounter.     Ana Cipro Ward, PA-C [facility] [phone]

## 2023-11-25 DIAGNOSIS — M19071 Primary osteoarthritis, right ankle and foot: Secondary | ICD-10-CM | POA: Diagnosis not present

## 2023-11-25 DIAGNOSIS — L089 Local infection of the skin and subcutaneous tissue, unspecified: Secondary | ICD-10-CM | POA: Diagnosis not present

## 2023-11-25 DIAGNOSIS — B353 Tinea pedis: Secondary | ICD-10-CM | POA: Diagnosis not present

## 2023-11-25 DIAGNOSIS — E1151 Type 2 diabetes mellitus with diabetic peripheral angiopathy without gangrene: Secondary | ICD-10-CM | POA: Diagnosis not present

## 2023-11-25 DIAGNOSIS — M792 Neuralgia and neuritis, unspecified: Secondary | ICD-10-CM | POA: Diagnosis not present

## 2023-11-25 DIAGNOSIS — M19072 Primary osteoarthritis, left ankle and foot: Secondary | ICD-10-CM | POA: Diagnosis not present

## 2023-11-25 DIAGNOSIS — L97522 Non-pressure chronic ulcer of other part of left foot with fat layer exposed: Secondary | ICD-10-CM | POA: Diagnosis not present

## 2023-11-30 ENCOUNTER — Ambulatory Visit (HOSPITAL_COMMUNITY): Admission: RE | Admit: 2023-11-30 | Payer: Medicare Other | Source: Ambulatory Visit | Admitting: Orthopedic Surgery

## 2023-11-30 ENCOUNTER — Encounter (HOSPITAL_COMMUNITY): Admission: RE | Payer: Self-pay | Source: Ambulatory Visit

## 2023-11-30 LAB — TYPE AND SCREEN
ABO/RH(D): O POS
Antibody Screen: NEGATIVE

## 2023-11-30 SURGERY — ARTHROPLASTY, HIP, TOTAL, ANTERIOR APPROACH
Anesthesia: Spinal | Site: Hip | Laterality: Right

## 2023-12-11 DIAGNOSIS — M19072 Primary osteoarthritis, left ankle and foot: Secondary | ICD-10-CM | POA: Diagnosis not present

## 2023-12-11 DIAGNOSIS — M19071 Primary osteoarthritis, right ankle and foot: Secondary | ICD-10-CM | POA: Diagnosis not present

## 2023-12-11 DIAGNOSIS — L089 Local infection of the skin and subcutaneous tissue, unspecified: Secondary | ICD-10-CM | POA: Diagnosis not present

## 2023-12-11 DIAGNOSIS — E1151 Type 2 diabetes mellitus with diabetic peripheral angiopathy without gangrene: Secondary | ICD-10-CM | POA: Diagnosis not present

## 2023-12-11 DIAGNOSIS — B353 Tinea pedis: Secondary | ICD-10-CM | POA: Diagnosis not present

## 2023-12-11 DIAGNOSIS — L97522 Non-pressure chronic ulcer of other part of left foot with fat layer exposed: Secondary | ICD-10-CM | POA: Diagnosis not present

## 2023-12-11 DIAGNOSIS — M792 Neuralgia and neuritis, unspecified: Secondary | ICD-10-CM | POA: Diagnosis not present

## 2023-12-23 DIAGNOSIS — E1151 Type 2 diabetes mellitus with diabetic peripheral angiopathy without gangrene: Secondary | ICD-10-CM | POA: Diagnosis not present

## 2023-12-23 DIAGNOSIS — M19071 Primary osteoarthritis, right ankle and foot: Secondary | ICD-10-CM | POA: Diagnosis not present

## 2023-12-23 DIAGNOSIS — M792 Neuralgia and neuritis, unspecified: Secondary | ICD-10-CM | POA: Diagnosis not present

## 2023-12-23 DIAGNOSIS — M19072 Primary osteoarthritis, left ankle and foot: Secondary | ICD-10-CM | POA: Diagnosis not present

## 2023-12-23 DIAGNOSIS — B353 Tinea pedis: Secondary | ICD-10-CM | POA: Diagnosis not present

## 2023-12-23 DIAGNOSIS — L089 Local infection of the skin and subcutaneous tissue, unspecified: Secondary | ICD-10-CM | POA: Diagnosis not present

## 2023-12-23 DIAGNOSIS — L97522 Non-pressure chronic ulcer of other part of left foot with fat layer exposed: Secondary | ICD-10-CM | POA: Diagnosis not present

## 2023-12-28 DIAGNOSIS — Z0000 Encounter for general adult medical examination without abnormal findings: Secondary | ICD-10-CM | POA: Diagnosis not present

## 2023-12-28 DIAGNOSIS — F419 Anxiety disorder, unspecified: Secondary | ICD-10-CM | POA: Diagnosis not present

## 2023-12-28 DIAGNOSIS — I700 Atherosclerosis of aorta: Secondary | ICD-10-CM | POA: Diagnosis not present

## 2023-12-28 DIAGNOSIS — J449 Chronic obstructive pulmonary disease, unspecified: Secondary | ICD-10-CM | POA: Diagnosis not present

## 2023-12-28 DIAGNOSIS — N183 Chronic kidney disease, stage 3 unspecified: Secondary | ICD-10-CM | POA: Diagnosis not present

## 2023-12-28 DIAGNOSIS — M810 Age-related osteoporosis without current pathological fracture: Secondary | ICD-10-CM | POA: Diagnosis not present

## 2023-12-28 DIAGNOSIS — E785 Hyperlipidemia, unspecified: Secondary | ICD-10-CM | POA: Diagnosis not present

## 2023-12-28 DIAGNOSIS — E11621 Type 2 diabetes mellitus with foot ulcer: Secondary | ICD-10-CM | POA: Diagnosis not present

## 2023-12-28 DIAGNOSIS — Z23 Encounter for immunization: Secondary | ICD-10-CM | POA: Diagnosis not present

## 2023-12-28 DIAGNOSIS — L97521 Non-pressure chronic ulcer of other part of left foot limited to breakdown of skin: Secondary | ICD-10-CM | POA: Diagnosis not present

## 2023-12-28 DIAGNOSIS — I2510 Atherosclerotic heart disease of native coronary artery without angina pectoris: Secondary | ICD-10-CM | POA: Diagnosis not present

## 2023-12-28 DIAGNOSIS — E1140 Type 2 diabetes mellitus with diabetic neuropathy, unspecified: Secondary | ICD-10-CM | POA: Diagnosis not present

## 2023-12-28 DIAGNOSIS — I129 Hypertensive chronic kidney disease with stage 1 through stage 4 chronic kidney disease, or unspecified chronic kidney disease: Secondary | ICD-10-CM | POA: Diagnosis not present

## 2023-12-30 ENCOUNTER — Other Ambulatory Visit: Payer: Self-pay | Admitting: Family Medicine

## 2023-12-30 DIAGNOSIS — M810 Age-related osteoporosis without current pathological fracture: Secondary | ICD-10-CM

## 2023-12-31 ENCOUNTER — Other Ambulatory Visit: Payer: Self-pay | Admitting: Family Medicine

## 2023-12-31 DIAGNOSIS — Z1231 Encounter for screening mammogram for malignant neoplasm of breast: Secondary | ICD-10-CM

## 2024-01-04 DIAGNOSIS — E1151 Type 2 diabetes mellitus with diabetic peripheral angiopathy without gangrene: Secondary | ICD-10-CM | POA: Diagnosis not present

## 2024-01-04 DIAGNOSIS — L089 Local infection of the skin and subcutaneous tissue, unspecified: Secondary | ICD-10-CM | POA: Diagnosis not present

## 2024-01-04 DIAGNOSIS — M19071 Primary osteoarthritis, right ankle and foot: Secondary | ICD-10-CM | POA: Diagnosis not present

## 2024-01-04 DIAGNOSIS — B353 Tinea pedis: Secondary | ICD-10-CM | POA: Diagnosis not present

## 2024-01-04 DIAGNOSIS — M19072 Primary osteoarthritis, left ankle and foot: Secondary | ICD-10-CM | POA: Diagnosis not present

## 2024-01-04 DIAGNOSIS — M792 Neuralgia and neuritis, unspecified: Secondary | ICD-10-CM | POA: Diagnosis not present

## 2024-01-04 DIAGNOSIS — L97522 Non-pressure chronic ulcer of other part of left foot with fat layer exposed: Secondary | ICD-10-CM | POA: Diagnosis not present

## 2024-01-10 NOTE — Progress Notes (Deleted)
Patient Care Team: Ana Montana, MD as PCP - General (Family Medicine) Ana Si, MD as PCP - Cardiology (Cardiology)  Clinic Day:  01/10/2024  Referring physician: Laurann Montana, MD  ASSESSMENT & PLAN:   Assessment & Plan: 83 y.o.female referred to [facility] for history of Acute deep vein thrombosis (DVT) of femoral vein of left lower extremity. [name] has history of hypertension, spinal stenosis, CAD, tobacco abuse, ascending aorta aneurysm, emphysema.   First episode: 07/2023 Acute deep vein thrombosis (DVT) of femoral vein of left lower extremity  Setting: on aspirin 162 mg while developed dvt. Vaping.  Risk factor: Crohn's disease, personal history, DM. Treatment: apixaban twice daily   Recommend continue apixaban 5 mg twice daily until 48 hours before surgery or at surgeon's recommendation Recommend resume apixaban 5 mg twice daily postop when hemostasis. Follow up with cardiology in the next few months on need for aspirin  Follow up in 3 months with me about 2-3 months post hip replacement   There are no diagnoses linked to this encounter.   The patient understands the plans discussed today and is in agreement with them.  She knows to contact our office if she develops concerns prior to her next appointment.  Ana Sartorius, MD  [facility] - A DEPT OF [facility] [address] [place] [address] Dept: [phone] Dept Fax: [phone]   No orders of the defined types were placed in this encounter.     CHIEF COMPLAINT:  CC: ***  Current Treatment:  ***  INTERVAL HISTORY:  [name] is here today for follow up. She is feeling     ALLERGIES:  is allergic to crestor [rosuvastatin].  MEDICATIONS:  Current Outpatient Medications  Medication Sig Dispense Refill   atorvastatin (LIPITOR) 40 MG tablet Take 40 mg by mouth daily.     calcium-vitamin D  (OSCAL WITH D) 500-200 MG-UNIT tablet Take 1 tablet by mouth daily with breakfast.     Cholecalciferol (VITAMIN D-3) 125 MCG (5000 UT) TABS Take 5,000 Units by mouth daily.     Cyanocobalamin (VITAMIN B-12) 5000 MCG TBDP Take 5,000 mcg by mouth daily.     ELIQUIS 5 MG TABS tablet Take 5 mg by mouth 2 (two) times daily.     JARDIANCE 10 MG TABS tablet Take 20 mg by mouth daily.     latanoprost (XALATAN) 0.005 % ophthalmic solution Place 1 drop into both eyes at bedtime.     lisinopril (ZESTRIL) 2.5 MG tablet Take 2.5 mg by mouth daily.     Omega-3 Fatty Acids (FISH OIL) 1000 MG CAPS Take 1,000 mg by mouth daily.     ondansetron (ZOFRAN-ODT) 4 MG disintegrating tablet Take 4 mg by mouth every 8 (eight) hours as needed for vomiting or nausea.     oxybutynin (DITROPAN-XL) 10 MG 24 hr tablet Take 10 mg by mouth daily.     RESTASIS 0.05 % ophthalmic emulsion Place 1 drop into both eyes 2 (two) times daily.     traMADol (ULTRAM) 50 MG tablet Take 50 mg by mouth 4 (four) times daily as needed for moderate pain or severe pain.     umeclidinium-vilanterol (ANORO ELLIPTA) 62.5-25 MCG/INH AEPB Inhale 1 puff into the lungs daily.     Wheat Dextrin (BENEFIBER) CHEW Chew 2 tablets by mouth daily as needed (regulation).     No current facility-administered medications for this visit.  HISTORY OF PRESENT ILLNESS:   Past Medical History:  Diagnosis Date   Aneurysm of ascending aorta (HCC) 06/11/2018   4.1cm on CT 11/2017.   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   VITALS:  There were no vitals taken for this visit.  Wt Readings from Last 3 Encounters:  11/20/23 169 lb (76.7 kg)  10/28/23 170 lb 3.2 oz (77.2 kg)  10/12/23 167 lb 4.8 oz (75.9 kg)    There is no height or weight on file to calculate BMI.  Performance status (ECOG): {CHL ONC [id number]  PHYSICAL EXAM:   GENERAL: alert, no distress and comfortable SKIN: skin color normal, no jaundice EYES: normal, sclera clear OROPHARYNX: no exudate, or lesions  NECK: supple, no palpable mass LYMPH:  no palpable cervical lymphadenopathy LUNGS: clear to auscultation with normal breathing effort.  No wheeze or Rales HEART: regular rate & rhythm and no lower extremity edema ABDOMEN: abdomen soft, non-tender and nondistended Musculoskeletal: No point tenderness NEURO: alert, no focal motor/sensory deficits  LABORATORY DATA:  I have reviewed the data as listed    Component Value Date/Time   NA 140 11/20/2023 1004   NA 141 11/26/2017 0920   K 4.0 11/20/2023 1004   CL 109 11/20/2023 1004   CO2 20 (L) 11/20/2023 1004   GLUCOSE 178 (H) 11/20/2023 1004   BUN 26 (H) 11/20/2023 1004   BUN 17 11/26/2017 0920   CREATININE 0.89 11/20/2023 1004   CALCIUM 9.4 11/20/2023 1004   PROT 7.3 12/23/2020 0009   PROT 7.5 11/26/2017 0920   ALBUMIN 3.8 12/23/2020 0009   ALBUMIN 4.0 11/26/2017 0920   AST 32 12/23/2020 0009   ALT 38 12/23/2020 0009   ALKPHOS 77 12/23/2020 0009   BILITOT 0.6 12/23/2020 0009   BILITOT 0.3 11/26/2017 0920   GFRNONAA >60 11/20/2023 1004   GFRAA 50 (L) 11/26/2017 0920    No results found for: "SPEP", "UPEP"  Lab Results  Component Value Date   WBC 8.0 11/20/2023   NEUTROABS 6.0 10/12/2023   HGB 16.8 (H) 11/20/2023   HCT 51.2 (H) 11/20/2023   MCV 93.1 11/20/2023   PLT 173 11/20/2023      Chemistry      Component Value Date/Time   NA 140 11/20/2023 1004   NA 141 11/26/2017 0920   K 4.0 11/20/2023 1004   CL  109 11/20/2023 1004   CO2 20 (L) 11/20/2023 1004   BUN 26 (H) 11/20/2023 1004   BUN 17 11/26/2017 0920   CREATININE 0.89 11/20/2023 1004      Component Value Date/Time   CALCIUM 9.4 11/20/2023 1004   ALKPHOS 77 12/23/2020 0009   AST 32 12/23/2020 0009   ALT 38 12/23/2020 0009   BILITOT 0.6 12/23/2020 0009   BILITOT 0.3 11/26/2017 0920       RADIOGRAPHIC STUDIES: I have personally reviewed the radiological images as listed and agreed with  the findings in the report. No results found.

## 2024-01-11 ENCOUNTER — Encounter: Payer: Self-pay | Admitting: *Deleted

## 2024-01-11 DIAGNOSIS — B353 Tinea pedis: Secondary | ICD-10-CM | POA: Diagnosis not present

## 2024-01-11 DIAGNOSIS — L97522 Non-pressure chronic ulcer of other part of left foot with fat layer exposed: Secondary | ICD-10-CM | POA: Diagnosis not present

## 2024-01-11 DIAGNOSIS — L089 Local infection of the skin and subcutaneous tissue, unspecified: Secondary | ICD-10-CM | POA: Diagnosis not present

## 2024-01-11 DIAGNOSIS — E1151 Type 2 diabetes mellitus with diabetic peripheral angiopathy without gangrene: Secondary | ICD-10-CM | POA: Diagnosis not present

## 2024-01-11 DIAGNOSIS — M19071 Primary osteoarthritis, right ankle and foot: Secondary | ICD-10-CM | POA: Diagnosis not present

## 2024-01-11 DIAGNOSIS — M19072 Primary osteoarthritis, left ankle and foot: Secondary | ICD-10-CM | POA: Diagnosis not present

## 2024-01-11 DIAGNOSIS — M792 Neuralgia and neuritis, unspecified: Secondary | ICD-10-CM | POA: Diagnosis not present

## 2024-01-12 ENCOUNTER — Inpatient Hospital Stay: Payer: Medicare Other

## 2024-01-20 ENCOUNTER — Ambulatory Visit
Admission: RE | Admit: 2024-01-20 | Discharge: 2024-01-20 | Disposition: A | Discharge status: Home / Self Care | Payer: Medicare Other | Source: Ambulatory Visit | Attending: Family Medicine | Admitting: Family Medicine

## 2024-01-20 DIAGNOSIS — Z1231 Encounter for screening mammogram for malignant neoplasm of breast: Secondary | ICD-10-CM | POA: Diagnosis not present

## 2024-01-25 DIAGNOSIS — L97522 Non-pressure chronic ulcer of other part of left foot with fat layer exposed: Secondary | ICD-10-CM | POA: Diagnosis not present

## 2024-01-25 DIAGNOSIS — M19072 Primary osteoarthritis, left ankle and foot: Secondary | ICD-10-CM | POA: Diagnosis not present

## 2024-01-25 DIAGNOSIS — L089 Local infection of the skin and subcutaneous tissue, unspecified: Secondary | ICD-10-CM | POA: Diagnosis not present

## 2024-01-25 DIAGNOSIS — B353 Tinea pedis: Secondary | ICD-10-CM | POA: Diagnosis not present

## 2024-01-25 DIAGNOSIS — E1151 Type 2 diabetes mellitus with diabetic peripheral angiopathy without gangrene: Secondary | ICD-10-CM | POA: Diagnosis not present

## 2024-01-25 DIAGNOSIS — M792 Neuralgia and neuritis, unspecified: Secondary | ICD-10-CM | POA: Diagnosis not present

## 2024-01-25 DIAGNOSIS — M19071 Primary osteoarthritis, right ankle and foot: Secondary | ICD-10-CM | POA: Diagnosis not present

## 2024-02-01 DIAGNOSIS — E1140 Type 2 diabetes mellitus with diabetic neuropathy, unspecified: Secondary | ICD-10-CM | POA: Diagnosis not present

## 2024-02-01 DIAGNOSIS — R09A2 Foreign body sensation, throat: Secondary | ICD-10-CM | POA: Diagnosis not present

## 2024-02-02 DIAGNOSIS — R09A2 Foreign body sensation, throat: Secondary | ICD-10-CM | POA: Diagnosis not present

## 2024-02-04 NOTE — Telephone Encounter (Signed)
Office called to see if clearance is still valid. Patient surgery is 02/22/24. Please advise

## 2024-02-04 NOTE — Telephone Encounter (Signed)
   Pre-operative Risk Assessment    Patient Name: [name]  DOB: 1941/01/23 MRN: [id number]   Date of last office visit: 10/28/23 Reather Littler, NP Date of next office visit: NONE   Request for Surgical Clearance    Procedure:   RIGHT TOTAL HIP ARTHROPLASTY-ANTERIOR APPROACH  Date of Surgery:  Clearance 02/22/24                                Surgeon:  DR. Gean Birchwood Surgeon's Group or Practice Name:  [facility] Phone number:  [phone] Fax number:  [phone]   Type of Clearance Requested:   - Medical  - Pharmacy:  Hold Apixaban (Eliquis)     Type of Anesthesia:  Spinal   Additional requests/questions:    Elpidio Anis   02/04/2024, 5:43 PM

## 2024-02-05 ENCOUNTER — Other Ambulatory Visit: Payer: Self-pay | Admitting: Orthopedic Surgery

## 2024-02-05 DIAGNOSIS — B353 Tinea pedis: Secondary | ICD-10-CM | POA: Diagnosis not present

## 2024-02-05 DIAGNOSIS — L089 Local infection of the skin and subcutaneous tissue, unspecified: Secondary | ICD-10-CM | POA: Diagnosis not present

## 2024-02-05 DIAGNOSIS — E1151 Type 2 diabetes mellitus with diabetic peripheral angiopathy without gangrene: Secondary | ICD-10-CM | POA: Diagnosis not present

## 2024-02-05 DIAGNOSIS — M792 Neuralgia and neuritis, unspecified: Secondary | ICD-10-CM | POA: Diagnosis not present

## 2024-02-05 DIAGNOSIS — M19071 Primary osteoarthritis, right ankle and foot: Secondary | ICD-10-CM | POA: Diagnosis not present

## 2024-02-05 DIAGNOSIS — M19072 Primary osteoarthritis, left ankle and foot: Secondary | ICD-10-CM | POA: Diagnosis not present

## 2024-02-05 DIAGNOSIS — L97522 Non-pressure chronic ulcer of other part of left foot with fat layer exposed: Secondary | ICD-10-CM | POA: Diagnosis not present

## 2024-02-15 NOTE — Patient Instructions (Signed)
 SURGICAL WAITING ROOM VISITATION  Patients having surgery or a procedure may have no more than 2 support people in the waiting area - these visitors may rotate.    Children under the age of 21 must have an adult with them who is not the patient.  Due to an increase in RSV and influenza rates and associated hospitalizations, children ages 33 and under may not visit patients in [facility] hospitals.  Visitors with respiratory illnesses are discouraged from visiting and should remain at home.  If the patient needs to stay at the hospital during part of their recovery, the visitor guidelines for inpatient rooms apply. Pre-op nurse will coordinate an appropriate time for 1 support person to accompany patient in pre-op.  This support person may not rotate.    Please refer to the [facility] website for the visitor guidelines for Inpatients (after your surgery is over and you are in a regular room).       Your procedure is scheduled on: 02/22/24   Report to [facility] Main Entrance    Report to admitting at 10:45 AM   Call this number if you have problems the morning of surgery [phone]   Do not eat food :After Midnight.   After Midnight you may have the following liquids until 10:15 AM DAY OF SURGERY  Water Non-Citrus Juices (without pulp, NO RED-Apple, White grape, White cranberry) Black Coffee (NO MILK/CREAM OR CREAMERS, sugar ok)  Clear Tea (NO MILK/CREAM OR CREAMERS, sugar ok) regular and decaf                             Plain Jell-O (NO RED)                                           Fruit ices (not with fruit pulp, NO RED)                                     Popsicles (NO RED)                                                               Sports drinks like Gatorade (NO RED)                The day of surgery:  Drink ONE (1) Pre-Surgery  G2 at 10:15 AM the morning of surgery. Drink in one sitting. Do not sip.  This drink was given to you during your hospital   pre-op appointment visit. Nothing else to drink after completing the  Pre-Surgery G2.       Oral Hygiene is also important to reduce your risk of infection.                                    Remember - BRUSH YOUR TEETH THE MORNING OF SURGERY WITH YOUR REGULAR TOOTHPASTE  DENTURES WILL BE REMOVED PRIOR TO SURGERY PLEASE DO NOT APPLY "Poly grip" OR ADHESIVES!!!   Stop all vitamins  and herbal supplements 7 days before surgery.   Take these medicines the morning of surgery with A SIP OF WATER: Atorvastatin  DO NOT TAKE ANY ORAL DIABETIC MEDICATIONS DAY OF YOUR SURGERY Hold Jardiance for 72 hours prior to surgery. Last dose March 6th. Hold Ozempic for 7 days prior to surgery.             You may not have any metal on your body including hair pins, jewelry, and body piercing             Do not wear make-up, lotions, powders, perfumes/cologne, or deodorant  Do not wear nail polish including gel and S&S, artificial/acrylic nails, or any other type of covering on natural nails including finger and toenails. If you have artificial nails, gel coating, etc. that needs to be removed by a nail salon please have this removed prior to surgery or surgery may need to be canceled/ delayed if the surgeon/ anesthesia feels like they are unable to be safely monitored.   Do not shave  48 hours prior to surgery.    Do not bring valuables to the hospital. [facility] IS NOT             RESPONSIBLE   FOR VALUABLES.   Contacts, glasses, dentures or bridgework may not be worn into surgery.   Bring small overnight bag day of surgery.   DO NOT BRING YOUR HOME MEDICATIONS TO THE HOSPITAL. PHARMACY WILL DISPENSE MEDICATIONS LISTED ON YOUR MEDICATION LIST TO YOU DURING YOUR ADMISSION IN THE HOSPITAL!    Patients discharged on the day of surgery will not be allowed to drive home.  Someone NEEDS to stay with you for the first 24 hours after anesthesia.   Special Instructions: Bring a copy of your healthcare  power of attorney and living will documents the day of surgery if you haven't scanned them before.              Please read over the following fact sheets you were given: IF YOU HAVE QUESTIONS ABOUT YOUR PRE-OP INSTRUCTIONS PLEASE CALL [phone] [facility]   If you received a COVID test during your pre-op visit  it is requested that you wear a mask when out in public, stay away from anyone that may not be feeling well and notify your surgeon if you develop symptoms. If you test positive for Covid or have been in contact with anyone that has tested positive in the last 10 days please notify you surgeon.      Pre-operative 5 CHG Bath Instructions   You can play a key role in reducing the risk of infection after surgery. Your skin needs to be as free of germs as possible. You can reduce the number of germs on your skin by washing with CHG (chlorhexidine gluconate) soap before surgery. CHG is an antiseptic soap that kills germs and continues to kill germs even after washing.   DO NOT use if you have an allergy to chlorhexidine/CHG or antibacterial soaps. If your skin becomes reddened or irritated, stop using the CHG and notify one of our RNs at [phone].   Please shower with the CHG soap starting 4 days before surgery using the following schedule:     Please keep in mind the following:  DO NOT shave, including legs and underarms, starting the day of your first shower.   You may shave your face at any point before/day of surgery.  Place clean sheets on your bed the day you start  using CHG soap. Use a clean washcloth (not used since being washed) for each shower. DO NOT sleep with pets once you start using the CHG.   CHG Shower Instructions:  If you choose to wash your hair and private area, wash first with your normal shampoo/soap.  After you use shampoo/soap, rinse your hair and body thoroughly to remove shampoo/soap residue.  Turn the water OFF and apply about 3 tablespoons (45 ml) of  CHG soap to a CLEAN washcloth.  Apply CHG soap ONLY FROM YOUR NECK DOWN TO YOUR TOES (washing for 3-5 minutes)  DO NOT use CHG soap on face, private areas, open wounds, or sores.  Pay special attention to the area where your surgery is being performed.  If you are having back surgery, having someone wash your back for you may be helpful. Wait 2 minutes after CHG soap is applied, then you may rinse off the CHG soap.  Pat dry with a clean towel  Put on clean clothes/pajamas   If you choose to wear lotion, please use ONLY the CHG-compatible lotions on the back of this paper.     Additional instructions for the day of surgery: DO NOT APPLY any lotions, deodorants, cologne, or perfumes.   Put on clean/comfortable clothes.  Brush your teeth.  Ask your nurse before applying any prescription medications to the skin.      CHG Compatible Lotions   Aveeno Moisturizing lotion  Cetaphil Moisturizing Cream  Cetaphil Moisturizing Lotion  Clairol Herbal Essence Moisturizing Lotion, Dry Skin  Clairol Herbal Essence Moisturizing Lotion, Extra Dry Skin  Clairol Herbal Essence Moisturizing Lotion, Normal Skin  Curel Age Defying Therapeutic Moisturizing Lotion with Alpha Hydroxy  Curel Extreme Care Body Lotion  Curel Soothing Hands Moisturizing Hand Lotion  Curel Therapeutic Moisturizing Cream, Fragrance-Free  Curel Therapeutic Moisturizing Lotion, Fragrance-Free  Curel Therapeutic Moisturizing Lotion, Original Formula  Eucerin Daily Replenishing Lotion  Eucerin Dry Skin Therapy Plus Alpha Hydroxy Crme  Eucerin Dry Skin Therapy Plus Alpha Hydroxy Lotion  Eucerin Original Crme  Eucerin Original Lotion  Eucerin Plus Crme Eucerin Plus Lotion  Eucerin TriLipid Replenishing Lotion  Keri Anti-Bacterial Hand Lotion  Keri Deep Conditioning Original Lotion Dry Skin Formula Softly Scented  Keri Deep Conditioning Original Lotion, Fragrance Free Sensitive Skin Formula  Keri Lotion Fast Absorbing  Fragrance Free Sensitive Skin Formula  Keri Lotion Fast Absorbing Softly Scented Dry Skin Formula  Keri Original Lotion  Keri Skin Renewal Lotion Keri Silky Smooth Lotion  Keri Silky Smooth Sensitive Skin Lotion  Nivea Body Creamy Conditioning Oil  Nivea Body Extra Enriched Lotion  Nivea Body Original Lotion  Nivea Body Sheer Moisturizing Lotion Nivea Crme  Nivea Skin Firming Lotion  NutraDerm 30 Skin Lotion  NutraDerm Skin Lotion  NutraDerm Therapeutic Skin Cream  NutraDerm Therapeutic Skin Lotion  ProShield Protective Hand Cream  Incentive Spirometer (Watch this video at home: [email or web address])  An incentive spirometer is a tool that can help keep your lungs clear and active. This tool measures how well you are filling your lungs with each breath. Taking long deep breaths may help reverse or decrease the chance of developing breathing (pulmonary) problems (especially infection) following: A long period of time when you are unable to move or be active. BEFORE THE PROCEDURE  If the spirometer includes an indicator to show your best effort, your nurse or respiratory therapist will set it to a desired goal. If possible, sit up straight or lean slightly forward. Try not to slouch. Hold the incentive  spirometer in an upright position. INSTRUCTIONS FOR USE  Sit on the edge of your bed if possible, or sit up as far as you can in bed or on a chair. Hold the incentive spirometer in an upright position. Breathe out normally. Place the mouthpiece in your mouth and seal your lips tightly around it. Breathe in slowly and as deeply as possible, raising the piston or the ball toward the top of the column. Hold your breath for 3-5 seconds or for as long as possible. Allow the piston or ball to fall to the bottom of the column. Remove the mouthpiece from your mouth and breathe out normally. Rest for a few seconds and repeat Steps 1 through 7 at least 10 times every 1-2  hours when you are awake. Take your time and take a few normal breaths between deep breaths. The spirometer may include an indicator to show your best effort. Use the indicator as a goal to work toward during each repetition. After each set of 10 deep breaths, practice coughing to be sure your lungs are clear. If you have an incision (the cut made at the time of surgery), support your incision when coughing by placing a pillow or rolled up towels firmly against it. Once you are able to get out of bed, walk around indoors and cough well. You may stop using the incentive spirometer when instructed by your caregiver.  RISKS AND COMPLICATIONS Take your time so you do not get dizzy or light-headed. If you are in pain, you may need to take or ask for pain medication before doing incentive spirometry. It is harder to take a deep breath if you are having pain. AFTER USE Rest and breathe slowly and easily. It can be helpful to keep track of a log of your progress. Your caregiver can provide you with a simple table to help with this. If you are using the spirometer at home, follow these instructions: SEEK MEDICAL CARE IF:  You are having difficultly using the spirometer. You have trouble using the spirometer as often as instructed. Your pain medication is not giving enough relief while using the spirometer. You develop fever of 100.5 F (38.1 C) or higher. SEEK IMMEDIATE MEDICAL CARE IF:  You cough up bloody sputum that had not been present before. You develop fever of 102 F (38.9 C) or greater. You develop worsening pain at or near the incision site. MAKE SURE YOU:  Understand these instructions. Will watch your condition. Will get help right away if you are not doing well or get worse. Document Released: 04/13/2007 Document Revised: 02/23/2012 Document Reviewed: 06/14/2007   WHAT IS A BLOOD TRANSFUSION? Blood Transfusion Information  A transfusion is the replacement of blood or some of its  parts. Blood is made up of multiple cells which provide different functions. Red blood cells carry oxygen and are used for blood loss replacement. White blood cells fight against infection. Platelets control bleeding. Plasma helps clot blood. Other blood products are available for specialized needs, such as hemophilia or other clotting disorders. BEFORE THE TRANSFUSION  Who gives blood for transfusions?  Healthy volunteers who are fully evaluated to make sure their blood is safe. This is blood bank blood. Transfusion therapy is the safest it has ever been in the practice of medicine. Before blood is taken from a donor, a complete history is taken to make sure that person has no history of diseases nor engages in risky social behavior (examples are intravenous drug use or sexual  activity with multiple partners). The donor's travel history is screened to minimize risk of transmitting infections, such as malaria. The donated blood is tested for signs of infectious diseases, such as HIV and hepatitis. The blood is then tested to be sure it is compatible with you in order to minimize the chance of a transfusion reaction. If you or a relative donates blood, this is often done in anticipation of surgery and is not appropriate for emergency situations. It takes many days to process the donated blood. RISKS AND COMPLICATIONS Although transfusion therapy is very safe and saves many lives, the main dangers of transfusion include:  Getting an infectious disease. Developing a transfusion reaction. This is an allergic reaction to something in the blood you were given. Every precaution is taken to prevent this. The decision to have a blood transfusion has been considered carefully by your caregiver before blood is given. Blood is not given unless the benefits outweigh the risks. AFTER THE TRANSFUSION Right after receiving a blood transfusion, you will usually feel much better and more energetic. This is especially  true if your red blood cells have gotten low (anemic). The transfusion raises the level of the red blood cells which carry oxygen, and this usually causes an energy increase. The nurse administering the transfusion will monitor you carefully for complications. HOME CARE INSTRUCTIONS  No special instructions are needed after a transfusion. You may find your energy is better. Speak with your caregiver about any limitations on activity for underlying diseases you may have. SEEK MEDICAL CARE IF:  Your condition is not improving after your transfusion. You develop redness or irritation at the intravenous (IV) site. SEEK IMMEDIATE MEDICAL CARE IF:  Any of the following symptoms occur over the next 12 hours: Shaking chills. You have a temperature by mouth above 102 F (38.9 C), not controlled by medicine. Chest, back, or muscle pain. People around you feel you are not acting correctly or are confused. Shortness of breath or difficulty breathing. Dizziness and fainting. You get a rash or develop hives. You have a decrease in urine output. Your urine turns a dark color or changes to pink, red, or brown. Any of the following symptoms occur over the next 10 days: You have a temperature by mouth above 102 F (38.9 C), not controlled by medicine. Shortness of breath. Weakness after normal activity. The white part of the eye turns yellow (jaundice). You have a decrease in the amount of urine or are urinating less often. Your urine turns a dark color or changes to pink, red, or brown. Document Released: 11/28/2000 Document Revised: 02/23/2012 Document Reviewed: 07/17/2008 [facility] Patient Information 2014 [place], [place].

## 2024-02-15 NOTE — Progress Notes (Signed)
 COVID Vaccine received:  []  No [x]  Yes Date of any COVID positive Test in last 90 days: no PCP - Laurann Montana MD Cardiologist - Chilton Si MD  Chest x-ray -  EKG -  10/28/23 Stress Test - 02/17/18 Epic ECHO - 11/16/23 Epic Cardiac Cath -   Cardiac clearance 11/13/24Reather Littler NP  Bowel Prep - [x]  No  []   Yes ______  Pacemaker / ICD device [x]  No []  Yes   Spinal Cord Stimulator:[x]  No []  Yes       History of Sleep Apnea? []  No [x]  Yes   CPAP used?- [x]  No []  Yes    Does the patient monitor blood sugar?          []  No [x]  Yes  []  N/A  Patient has: []  NO Hx DM   []  Pre-DM                 []  DM1  [x]   DM2 Does patient have a [employer] or Dexacom? [x]  No []  Yes   Fasting Blood Sugar Ranges- 11-115 Checks Blood Sugar ___2_ times a day  GLP1 agonist / usual dose - Ozempic Last dose 02/11/24. Will hol till after surgery GLP1 instructions:  SGLT-2 inhibitors / usual dose -  SGLT-2 instructions:   Blood Thinner / Instructions:Eliquis  last dose will be 02/18/24 6 pm Aspirin Instructions:  Comments:   Activity level: Patient is able to climb a flight of stairs without difficulty; [x]  No CP  [x]  No SOB,   Patient can /  perform ADLs without assistance.   Anesthesia review: CAD, AAA, HTN, COPD, DM, RBBB  Patient denies shortness of breath, fever, cough and chest pain at PAT appointment.  Patient verbalized understanding and agreement to the Pre-Surgical Instructions that were given to them at this PAT appointment. Patient was also educated of the need to review these PAT instructions again prior to his/her surgery.I reviewed the appropriate phone numbers to call if they have any and questions or concerns.

## 2024-02-16 ENCOUNTER — Other Ambulatory Visit: Payer: Self-pay

## 2024-02-16 ENCOUNTER — Encounter (HOSPITAL_COMMUNITY)
Admission: RE | Admit: 2024-02-16 | Discharge: 2024-02-16 | Disposition: A | Discharge status: Home / Self Care | Payer: Medicare Other | Source: Ambulatory Visit | Attending: Orthopedic Surgery | Admitting: Orthopedic Surgery

## 2024-02-16 VITALS — BP 122/81 | HR 60 | Temp 97.5°F | Resp 18 | Ht 63.0 in | Wt 164.0 lb

## 2024-02-16 DIAGNOSIS — E119 Type 2 diabetes mellitus without complications: Secondary | ICD-10-CM | POA: Diagnosis not present

## 2024-02-16 DIAGNOSIS — J449 Chronic obstructive pulmonary disease, unspecified: Secondary | ICD-10-CM | POA: Insufficient documentation

## 2024-02-16 DIAGNOSIS — Z01818 Encounter for other preprocedural examination: Secondary | ICD-10-CM

## 2024-02-16 DIAGNOSIS — Z7985 Long-term (current) use of injectable non-insulin antidiabetic drugs: Secondary | ICD-10-CM | POA: Diagnosis not present

## 2024-02-16 DIAGNOSIS — M1611 Unilateral primary osteoarthritis, right hip: Secondary | ICD-10-CM | POA: Insufficient documentation

## 2024-02-16 DIAGNOSIS — Z01812 Encounter for preprocedural laboratory examination: Secondary | ICD-10-CM | POA: Diagnosis not present

## 2024-02-16 DIAGNOSIS — K5090 Crohn's disease, unspecified, without complications: Secondary | ICD-10-CM | POA: Diagnosis not present

## 2024-02-16 DIAGNOSIS — Z7984 Long term (current) use of oral hypoglycemic drugs: Secondary | ICD-10-CM | POA: Insufficient documentation

## 2024-02-16 DIAGNOSIS — I10 Essential (primary) hypertension: Secondary | ICD-10-CM | POA: Insufficient documentation

## 2024-02-16 DIAGNOSIS — Z87891 Personal history of nicotine dependence: Secondary | ICD-10-CM | POA: Insufficient documentation

## 2024-02-16 DIAGNOSIS — Z86718 Personal history of other venous thrombosis and embolism: Secondary | ICD-10-CM | POA: Diagnosis not present

## 2024-02-16 LAB — BASIC METABOLIC PANEL
Anion gap: 11 (ref 5–15)
BUN: 32 mg/dL — ABNORMAL HIGH (ref 8–23)
CO2: 21 mmol/L — ABNORMAL LOW (ref 22–32)
Calcium: 9.7 mg/dL (ref 8.9–10.3)
Chloride: 108 mmol/L (ref 98–111)
Creatinine, Ser: 0.84 mg/dL (ref 0.44–1.00)
GFR, Estimated: >60 mL/min (ref >60)
Glucose, Bld: 117 mg/dL — ABNORMAL HIGH (ref 70–99)
Potassium: 4.3 mmol/L (ref 3.5–5.1)
Sodium: 140 mmol/L (ref 135–145)

## 2024-02-16 LAB — CBC
HCT: 50.5 % — ABNORMAL HIGH (ref 36.0–46.0)
Hemoglobin: 16.2 g/dL — ABNORMAL HIGH (ref 12.0–15.0)
MCH: 30.5 pg (ref 26.0–34.0)
MCHC: 32.1 g/dL (ref 30.0–36.0)
MCV: 95.1 fL (ref 80.0–100.0)
Platelets: 175 10*3/uL (ref 150–400)
RBC: 5.31 MIL/uL — ABNORMAL HIGH (ref 3.87–5.11)
RDW: 14.6 % (ref 11.5–15.5)
WBC: 8.8 10*3/uL (ref 4.0–10.5)
nRBC: 0 % (ref 0.0–0.2)

## 2024-02-16 LAB — SURGICAL PCR SCREEN
MRSA, PCR: NEGATIVE
Staphylococcus aureus: NEGATIVE

## 2024-02-16 LAB — GLUCOSE, CAPILLARY: Glucose-Capillary: 170 mg/dL — ABNORMAL HIGH (ref 70–99)

## 2024-02-17 DIAGNOSIS — M1611 Unilateral primary osteoarthritis, right hip: Secondary | ICD-10-CM | POA: Diagnosis present

## 2024-02-17 DIAGNOSIS — R09A2 Foreign body sensation, throat: Secondary | ICD-10-CM | POA: Diagnosis not present

## 2024-02-17 DIAGNOSIS — K219 Gastro-esophageal reflux disease without esophagitis: Secondary | ICD-10-CM | POA: Diagnosis not present

## 2024-02-17 LAB — HEMOGLOBIN A1C
Hgb A1c MFr Bld: 6.4 % — ABNORMAL HIGH (ref 4.8–5.6)
Mean Plasma Glucose: 136.98 mg/dL

## 2024-02-17 NOTE — H&P (Signed)
 TOTAL HIP ADMISSION H&P  Patient is admitted for right total hip arthroplasty.  Subjective:  Chief Complaint: right hip pain  HPI: [name], 83 y.o. female, has a history of pain and functional disability in the right hip(s) due to arthritis and patient has failed non-surgical conservative treatments for greater than 12 weeks to include NSAID's and/or analgesics, flexibility and strengthening excercises, use of assistive devices, and activity modification.  Onset of symptoms was gradual starting  several  years ago with gradually worsening course since that time.The patient noted no past surgery on the right hip(s).  Patient currently rates pain in the right hip at 10 out of 10 with activity. Patient has night pain, worsening of pain with activity and weight bearing, trendelenberg gait, pain that interfers with activities of daily living, and pain with passive range of motion. Patient has evidence of subchondral sclerosis and joint space narrowing by imaging studies. This condition presents safety issues increasing the risk of falls.  There is no current active infection.  Patient Active Problem List   Diagnosis Date Noted   Osteoarthritis of right hip 02/17/2024   DOE (dyspnea on exertion) 09/20/2019   Emphysema of lung (HCC) 09/20/2019   Gait abnormality 09/20/2019   Aneurysm of ascending aorta (HCC) 06/11/2018   Essential hypertension 11/20/2017   CAD (coronary artery disease), native coronary artery 11/20/2017   Tobacco abuse 11/20/2017   Past Medical History:  Diagnosis Date   Aneurysm of ascending aorta (HCC) 06/11/2018   4.1cm on CT 11/2017.   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    COPD (chronic obstructive pulmonary disease) (HCC)    Crohn's disease (HCC)    Diabetes mellitus without complication (HCC)    DVT (deep venous thrombosis) (HCC)    Dyspnea    Essential hypertension 11/20/2017   Gait difficulty    History  of kidney stones    OAB (overactive bladder)    Smoker    Tobacco abuse 11/20/2017   Vertigo     Past Surgical History:  Procedure Laterality Date   BLADDER SURGERY     CHOLECYSTECTOMY     FOOT ARTHRODESIS Left 07/02/2017   Procedure: Left Subtalar Arthrodesis;  Surgeon: Toni Arthurs, MD;  Location: [facility];  Service: Orthopedics;  Laterality: Left;   FRACTURE SURGERY     HARDWARE REMOVAL Left 04/24/2016   Procedure: REMOVAL OF DEEP IMPLANT LEFT FOOT;  Surgeon: Toni Arthurs, MD;  Location: [facility];  Service: Orthopedics;  Laterality: Left;  ANESTHESIA:  REGIONAL/MONITOR ANESTHESIA   HARDWARE REMOVAL Left 07/02/2017   Procedure: Removal of Deep Implants;  Surgeon: Toni Arthurs, MD;  Location: [facility];  Service: Orthopedics;  Laterality: Left;   JOINT REPLACEMENT     knee   PERIPHERAL VASCULAR BALLOON ANGIOPLASTY  08/11/2023   Procedure: PERIPHERAL VASCULAR BALLOON ANGIOPLASTY;  Surgeon: Nada Libman, MD;  Location: [facility];  Service: Cardiovascular;;   PERIPHERAL VASCULAR THROMBECTOMY N/A 08/11/2023   Procedure: PERIPHERAL VASCULAR THROMBECTOMY VENOUS;  Surgeon: Nada Libman, MD;  Location: [facility];  Service: Cardiovascular;  Laterality: N/A;   throat polyps     TUBAL LIGATION      No current facility-administered medications for this encounter.   Current Outpatient Medications  Medication Sig Dispense Refill Last Dose/Taking   atorvastatin (LIPITOR) 40 MG tablet Take 40 mg by mouth daily.   Taking   BIOTIN PO  Take 1 tablet by mouth daily.   Taking   calcium-vitamin D (OSCAL WITH D) 500-200 MG-UNIT tablet Take 1 tablet by mouth daily with breakfast.   Taking   Cholecalciferol (VITAMIN D-3) 125 MCG (5000 UT) TABS Take 5,000 Units by mouth daily.   Taking   Collagen-Vitamin C-Biotin (COLLAGEN PO) Take 1 tablet by mouth daily.   Taking   Cyanocobalamin (VITAMIN B-12) 5000 MCG TBDP Take 5,000 mcg by mouth  daily.   Taking   ELIQUIS 5 MG TABS tablet Take 5 mg by mouth 2 (two) times daily.   Taking   empagliflozin (JARDIANCE) 25 MG TABS tablet Take 25 mg by mouth daily.   Taking   latanoprost (XALATAN) 0.005 % ophthalmic solution Place 1 drop into both eyes at bedtime.   Taking   lisinopril (ZESTRIL) 2.5 MG tablet Take 2.5 mg by mouth daily.   Taking   Multiple Vitamins-Minerals (WOMENS MULTI VITAMIN & MINERAL) TABS Take 1 tablet by mouth daily.   Taking   Omega-3 Fatty Acids (FISH OIL) 1000 MG CAPS Take 1,000 mg by mouth daily.   Taking   ondansetron (ZOFRAN-ODT) 4 MG disintegrating tablet Take 4 mg by mouth every 8 (eight) hours as needed for vomiting or nausea.   Taking As Needed   oxybutynin (DITROPAN-XL) 10 MG 24 hr tablet Take 10 mg by mouth daily.   Taking   OZEMPIC, 0.25 OR 0.5 MG/DOSE, 2 MG/3ML SOPN Inject 0.5 mg into the skin every Thursday.   Taking   RESTASIS 0.05 % ophthalmic emulsion Place 1 drop into both eyes 2 (two) times daily.   Taking   traMADol (ULTRAM) 50 MG tablet Take 50 mg by mouth 4 (four) times daily as needed for severe pain (pain score 7-10).   Taking As Needed   umeclidinium-vilanterol (ANORO ELLIPTA) 62.5-25 MCG/INH AEPB Inhale 1 puff into the lungs daily.   Taking   Wheat Dextrin (BENEFIBER) CHEW Chew 2 tablets by mouth daily as needed (regulation).   Taking As Needed   Allergies  Allergen Reactions   Crestor [Rosuvastatin]     myalgia   Metformin Hcl Er     Other Reaction(s): fecal incontinence    Social History   Tobacco Use   Smoking status: Former    Current packs/day: 1.00    Average packs/day: 1 pack/day for 40.0 years (40.0 ttl pk-yrs)    Types: Cigarettes, E-cigarettes   Smokeless tobacco: Never   Tobacco comments:    currently trying to quit 06-26-17  Substance Use Topics   Alcohol use: No    Alcohol/week: 0.0 standard drinks of alcohol    Family History  Problem Relation Age of Onset   Cancer Mother    Stroke Sister      Review of Systems   Constitutional: Negative.   HENT: Negative.    Eyes: Negative.   Respiratory:  Positive for shortness of breath.   Cardiovascular: Negative.        Htn  Gastrointestinal:  Positive for constipation and diarrhea.  Endocrine: Negative.   Genitourinary: Negative.   Musculoskeletal:  Positive for arthralgias and myalgias.  Skin: Negative.   Allergic/Immunologic: Negative.   Neurological:  Positive for weakness.  Hematological:  Bruises/bleeds easily.  Psychiatric/Behavioral: Negative.      Objective:  Physical Exam Constitutional:      Appearance: Normal appearance. She is normal weight.  HENT:     Head: Normocephalic and atraumatic.     Nose: Nose normal.  Eyes:  Pupils: Pupils are equal, round, and reactive to light.  Cardiovascular:     Pulses: Normal pulses.  Pulmonary:     Effort: Pulmonary effort is normal.  Musculoskeletal:        General: Tenderness present.     Cervical back: Normal range of motion and neck supple.     Comments: Patient walks with a profound right-sided limp.  Hip is irritable to internal rotation at 20 external rotation at 30 foot tap is mildly positive.  Good power to testing of hip flexors extensors abductors and abductors  Skin:    General: Skin is warm and dry.  Neurological:     General: No focal deficit present.     Mental Status: She is alert and oriented to person, place, and time. Mental status is at baseline.  Psychiatric:        Mood and Affect: Mood normal.        Behavior: Behavior normal.        Thought Content: Thought content normal.        Judgment: Judgment normal.     Vital signs in last 24 hours:    Labs:   Estimated body mass index is 29.05 kg/m as calculated from the following:   Height as of 02/16/24: 5\' 3"  (1.6 m).   Weight as of 02/16/24: 74.4 kg.   Imaging Review Plain radiographs demonstrate Pelvis AP and bilateral hip lateral 2 views x-ray was ordered, performed and interpreted by me in office, which  shows no any acute fracture or dislocation, moderate to severe degenerative change with the narrowed joint space, mainly involving the right hip joint.   Assessment/Plan:  End stage arthritis, right hip(s)  The patient history, physical examination, clinical judgement of the provider and imaging studies are consistent with end stage degenerative joint disease of the right hip(s) and total hip arthroplasty is deemed medically necessary. The treatment options including medical management, injection therapy, arthroscopy and arthroplasty were discussed at length. The risks and benefits of total hip arthroplasty were presented and reviewed. The risks due to aseptic loosening, infection, stiffness, dislocation/subluxation,  thromboembolic complications and other imponderables were discussed.  The patient acknowledged the explanation, agreed to proceed with the plan and consent was signed. Patient is being admitted for inpatient treatment for surgery, pain control, PT, OT, prophylactic antibiotics, VTE prophylaxis, progressive ambulation and ADL's and discharge planning.The patient is planning to be discharged home with home health services    Patient's anticipated LOS is less than 2 midnights, meeting these requirements: - Younger than 72 - Lives within 1 hour of care - Has a competent adult at home to recover with post-op recover - NO history of  - Chronic pain requiring opiods  - Diabetes  - Coronary Artery Disease  - Heart failure  - Heart attack  - Stroke  - DVT/VTE  - Cardiac arrhythmia  - Respiratory Failure/COPD  - Renal failure  - Anemia  - Advanced Liver disease

## 2024-02-18 DIAGNOSIS — M1611 Unilateral primary osteoarthritis, right hip: Secondary | ICD-10-CM | POA: Diagnosis not present

## 2024-02-18 NOTE — Care Plan (Signed)
 Ortho Bundle Case Management Note  Patient Details  Name: Thyra Yinger MRN: [id number] Date of Birth: [birth year]  spoke with patient's husband. she will discharge to home with him. ambulates with a walker at this time. HHPT set up with [facility] and OPPT set up with [facility] discharge instructions mailed and discussed. Patient and MD in agreement with plan. Choice offered                     DME Arranged:    DME Agency:     HH Arranged:  PT HH Agency:  [facility] ([facility])  Additional Comments: Please contact me with any questions of if this plan should need to change.  Shauna Hugh,  RN,BSN,MHA,CCM  [facility]  [phone] 02/18/2024, 12:06 PM

## 2024-02-18 NOTE — Progress Notes (Signed)
 Anesthesia Chart Review   Case: [id number] Date/Time: 02/22/24 1245   Procedure: RIGHT TOTAL HIP ARTHROPLASTY ANTERIOR APPROACH (Right: Hip)   Anesthesia type: Spinal   Pre-op diagnosis: RIGHT HIP OSTEOARTHRITIS   Location: [facility] ROOM 08 / [facility]   Surgeons: Gean Birchwood, MD       DISCUSSION:83 y.o. former smoker with h/o COPD, HTN, DM II, DVT, Crohn's disease, right hip OA scheduled for above procedure 02/22/2024 with Dr. Gean Birchwood.   Case previously postponed due to A1C of 8.2.  A1C at PAT visit now 6.4.   Pt last seen by cardiology 10/28/2023. Per OV note, "Preoperative cardiac evaluation: Ms. Barley perioperative risk of a major cardiac event is 0.4% according to the Revised Cardiac Risk Index (RCRI).  Therefore, she is at low risk for perioperative complications.   Her functional capacity is fair at 5.07 METs according to the Duke Activity Status Index (DASI). Given history of ascending aorta dilation and shortness of breath will have her repeat echocardiogram prior to surgery. Once echocardiogram has resulted cardiac examination and recommendations will be updated and sent to requesting surgeon.  ADDENDUM 11/17/23: Patient's echocardiogram shows no significant change from prior, she is at acceptable risk for surgery without any further cardiac testing.  Patient is no longer on aspirin given need for Eliquis.  Recommendations regarding holding of Eliquis should come from her oncologist, who is currently managing her DVT."  VS: BP 122/81   Pulse 60   Temp (!) 36.4 C (Oral)   Resp 18   Ht 5\' 3"  (1.6 m)   Wt 74.4 kg   SpO2 96%   BMI 29.05 kg/m   PROVIDERS: Laurann Montana, MD is PCP   Cardiologist:  Chilton Si, MD  LABS: Labs reviewed: Acceptable for surgery. (all labs ordered are listed, but only abnormal results are displayed)  Labs Reviewed  GLUCOSE, CAPILLARY - Abnormal; Notable for the following components:      Result Value   Glucose-Capillary 170 (*)    All other  components within normal limits  HEMOGLOBIN A1C - Abnormal; Notable for the following components:   Hgb A1c MFr Bld 6.4 (*)    All other components within normal limits  BASIC METABOLIC PANEL - Abnormal; Notable for the following components:   CO2 21 (*)    Glucose, Bld 117 (*)    BUN 32 (*)    All other components within normal limits  CBC - Abnormal; Notable for the following components:   RBC 5.31 (*)    Hemoglobin 16.2 (*)    HCT 50.5 (*)    All other components within normal limits  SURGICAL PCR SCREEN  TYPE AND SCREEN     IMAGES:   EKG:   CV: Echo 11/16/2023 1. Left ventricular ejection fraction, by estimation, is 55 to 60%. The  left ventricle has normal function. The left ventricle has no regional  wall motion abnormalities. There is mild concentric left ventricular  hypertrophy. Left ventricular diastolic  parameters are consistent with Grade I diastolic dysfunction (impaired  relaxation).   2. Right ventricular systolic function is normal. The right ventricular  size is normal. Tricuspid regurgitation signal is inadequate for assessing  PA pressure.   3. The mitral valve is normal in structure. No evidence of mitral valve  regurgitation.   4. The aortic valve is tricuspid. Aortic valve regurgitation is not  visualized.   5. There is mild dilatation of the ascending aorta, measuring 40 mm.   6. The inferior vena  cava is normal in size with greater than 50%  respiratory variability, suggesting right atrial pressure of 3 mmHg.   Comparison(s): No significant change from prior study. Prior images  reviewed side by side.   Myocardial Perfusion 02/17/2018 Nuclear stress EF: 58%. The left ventricular ejection fraction is normal (55-65%). Blood pressure demonstrated a normal response to exercise. There was no ST segment deviation noted during stress. No T wave inversion was noted during stress. This is a low risk study.   No reversible ischemia. LVEF 58% with  normal wall motion. This is a low risk study.   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    COPD (chronic obstructive pulmonary disease) (HCC)    Crohn's disease (HCC)    Diabetes mellitus without complication (HCC)    DVT (deep venous thrombosis) (HCC)    Dyspnea    Essential hypertension 11/20/2017   Gait difficulty    History of kidney stones    OAB (overactive bladder)    Smoker    Tobacco abuse 11/20/2017   Vertigo     Past Surgical History:  Procedure Laterality Date   BLADDER SURGERY     CHOLECYSTECTOMY     FOOT ARTHRODESIS Left 07/02/2017   Procedure: Left Subtalar Arthrodesis;  Surgeon: Toni Arthurs, MD;  Location: [facility];  Service: Orthopedics;  Laterality: Left;   FRACTURE SURGERY     HARDWARE REMOVAL Left 04/24/2016   Procedure: REMOVAL OF DEEP IMPLANT LEFT FOOT;  Surgeon: Toni Arthurs, MD;  Location: [facility];  Service: Orthopedics;  Laterality: Left;  ANESTHESIA:  REGIONAL/MONITOR ANESTHESIA   HARDWARE REMOVAL Left 07/02/2017   Procedure: Removal of Deep Implants;  Surgeon: Toni Arthurs, MD;  Location: [facility];  Service: Orthopedics;  Laterality: Left;   JOINT REPLACEMENT     knee   PERIPHERAL VASCULAR BALLOON ANGIOPLASTY  08/11/2023   Procedure: PERIPHERAL VASCULAR BALLOON ANGIOPLASTY;  Surgeon: Nada Libman, MD;  [facility]: [facility];  Service: Cardiovascular;;   PERIPHERAL VASCULAR THROMBECTOMY N/A 08/11/2023   Procedure: PERIPHERAL VASCULAR THROMBECTOMY VENOUS;  Surgeon: Nada Libman, MD;  [facility]: [facility];  Service: Cardiovascular;  Laterality: N/A;   throat polyps     TUBAL LIGATION      MEDICATIONS:  atorvastatin (LIPITOR) 40 MG tablet   BIOTIN PO   calcium-vitamin D (OSCAL WITH D) 500-200 MG-UNIT tablet    Cholecalciferol (VITAMIN D-3) 125 MCG (5000 UT) TABS   Collagen-Vitamin C-Biotin (COLLAGEN PO)   Cyanocobalamin (VITAMIN B-12) 5000 MCG TBDP   ELIQUIS 5 MG TABS tablet   empagliflozin (JARDIANCE) 25 MG TABS tablet   latanoprost (XALATAN) 0.005 % ophthalmic solution   lisinopril (ZESTRIL) 2.5 MG tablet   Multiple Vitamins-Minerals (WOMENS MULTI VITAMIN & MINERAL) TABS   Omega-3 Fatty Acids (FISH OIL) 1000 MG CAPS   ondansetron (ZOFRAN-ODT) 4 MG disintegrating tablet   oxybutynin (DITROPAN-XL) 10 MG 24 hr tablet   OZEMPIC, 0.25 OR 0.5 MG/DOSE, 2 MG/3ML SOPN   RESTASIS 0.05 % ophthalmic emulsion   traMADol (ULTRAM) 50 MG tablet   umeclidinium-vilanterol (ANORO ELLIPTA) 62.5-25 MCG/INH AEPB   Wheat Dextrin (BENEFIBER) CHEW   No current facility-administered medications for this encounter.     Jodell Cipro Ward, PA-C [facility] [phone]

## 2024-02-18 NOTE — Anesthesia Preprocedure Evaluation (Addendum)
 Anesthesia Evaluation  Patient identified by MRN, date of birth, ID band Patient awake    Reviewed: Allergy & Precautions, NPO status , Patient's Chart, lab work & pertinent test results  Airway Mallampati: II  TM Distance: >3 FB Neck ROM: Full    Dental  (+) Edentulous Upper, Edentulous Lower   Pulmonary COPD (anora daily, used today),  COPD inhaler, Patient abstained from smoking., former smoker 40 pack year history   Pulmonary exam normal breath sounds clear to auscultation       Cardiovascular hypertension (126/77), Pt. on medications + DOE and + DVT  Normal cardiovascular exam+ dysrhythmias (eliquis) Atrial Fibrillation  Rhythm:Regular Rate:Normal  CV: Echo 11/16/2023 1. Left ventricular ejection fraction, by estimation, is 55 to 60%. The  left ventricle has normal function. The left ventricle has no regional  wall motion abnormalities. There is mild concentric left ventricular  hypertrophy. Left ventricular diastolic  parameters are consistent with Grade I diastolic dysfunction (impaired  relaxation).   2. Right ventricular systolic function is normal. The right ventricular  size is normal. Tricuspid regurgitation signal is inadequate for assessing  PA pressure.   3. The mitral valve is normal in structure. No evidence of mitral valve  regurgitation.   4. The aortic valve is tricuspid. Aortic valve regurgitation is not  visualized.   5. There is mild dilatation of the ascending aorta, measuring 40 mm.   6. The inferior vena cava is normal in size with greater than 50%  respiratory variability, suggesting right atrial pressure of 3 mmHg.   Comparison(s): No significant change from prior study. Prior images  reviewed side by side.    Myocardial Perfusion 02/17/2018 Nuclear stress EF: 58%.  The left ventricular ejection fraction is normal (55-65%).  Blood pressure demonstrated a normal response to exercise.  There was  no ST segment deviation noted during stress.  No T wave inversion was noted during stress.  This is a low risk study.   No reversible ischemia. LVEF 58% with normal wall motion. This is a low risk study.      Neuro/Psych negative neurological ROS  negative psych ROS   GI/Hepatic negative GI ROS, Neg liver ROS,,,  Endo/Other  diabetes, Well Controlled  A1c 6.4  Renal/GU negative Renal ROS Bladder dysfunction (OAB)      Musculoskeletal  (+) Arthritis , Osteoarthritis,    Abdominal   Peds  Hematology negative hematology ROS (+)   Anesthesia Other Findings Ozempic LD: 2/27  Reproductive/Obstetrics negative OB ROS                             Anesthesia Physical Anesthesia Plan  ASA: 3  Anesthesia Plan: Spinal and MAC   Post-op Pain Management: Tylenol PO (pre-op)*   Induction:   PONV Risk Score and Plan: 3 and Ondansetron, Treatment may vary due to age or medical condition, Propofol infusion and TIVA  Airway Management Planned: Natural Airway and Simple Face Mask  Additional Equipment: None  Intra-op Plan:   Post-operative Plan:   Informed Consent: I have reviewed the patients History and Physical, chart, labs and discussed the procedure including the risks, benefits and alternatives for the proposed anesthesia with the patient or authorized representative who has indicated his/her understanding and acceptance.       Plan Discussed with: CRNA  Anesthesia Plan Comments:        Anesthesia Quick Evaluation

## 2024-02-22 ENCOUNTER — Encounter (HOSPITAL_COMMUNITY): Payer: Self-pay | Admitting: Orthopedic Surgery

## 2024-02-22 ENCOUNTER — Other Ambulatory Visit: Payer: Self-pay

## 2024-02-22 ENCOUNTER — Ambulatory Visit (HOSPITAL_COMMUNITY): Payer: Self-pay | Admitting: Anesthesiology

## 2024-02-22 ENCOUNTER — Ambulatory Visit (HOSPITAL_COMMUNITY)

## 2024-02-22 ENCOUNTER — Ambulatory Visit (HOSPITAL_COMMUNITY): Payer: Self-pay | Admitting: Physician Assistant

## 2024-02-22 ENCOUNTER — Encounter (HOSPITAL_COMMUNITY)
Admission: RE | Disposition: A | Discharge status: Home / Self Care | Payer: Self-pay | Source: Home / Self Care | Attending: Orthopedic Surgery

## 2024-02-22 ENCOUNTER — Ambulatory Visit (HOSPITAL_COMMUNITY)
Admission: RE | Admit: 2024-02-22 | Discharge: 2024-02-23 | Disposition: A | Discharge status: Home / Self Care | Payer: Medicare Other | Attending: Orthopedic Surgery | Admitting: Orthopedic Surgery

## 2024-02-22 DIAGNOSIS — E119 Type 2 diabetes mellitus without complications: Secondary | ICD-10-CM | POA: Diagnosis not present

## 2024-02-22 DIAGNOSIS — I4891 Unspecified atrial fibrillation: Secondary | ICD-10-CM

## 2024-02-22 DIAGNOSIS — M1611 Unilateral primary osteoarthritis, right hip: Secondary | ICD-10-CM | POA: Diagnosis not present

## 2024-02-22 DIAGNOSIS — J449 Chronic obstructive pulmonary disease, unspecified: Secondary | ICD-10-CM

## 2024-02-22 DIAGNOSIS — Z96641 Presence of right artificial hip joint: Secondary | ICD-10-CM | POA: Diagnosis present

## 2024-02-22 DIAGNOSIS — Z7984 Long term (current) use of oral hypoglycemic drugs: Secondary | ICD-10-CM | POA: Insufficient documentation

## 2024-02-22 DIAGNOSIS — I10 Essential (primary) hypertension: Secondary | ICD-10-CM | POA: Diagnosis not present

## 2024-02-22 DIAGNOSIS — Z7985 Long-term (current) use of injectable non-insulin antidiabetic drugs: Secondary | ICD-10-CM | POA: Diagnosis not present

## 2024-02-22 DIAGNOSIS — J439 Emphysema, unspecified: Secondary | ICD-10-CM | POA: Insufficient documentation

## 2024-02-22 DIAGNOSIS — Z79899 Other long term (current) drug therapy: Secondary | ICD-10-CM | POA: Insufficient documentation

## 2024-02-22 DIAGNOSIS — M25751 Osteophyte, right hip: Secondary | ICD-10-CM | POA: Insufficient documentation

## 2024-02-22 DIAGNOSIS — I2510 Atherosclerotic heart disease of native coronary artery without angina pectoris: Secondary | ICD-10-CM | POA: Diagnosis not present

## 2024-02-22 LAB — TYPE AND SCREEN
ABO/RH(D): O POS
Antibody Screen: NEGATIVE

## 2024-02-22 LAB — GLUCOSE, CAPILLARY
Glucose-Capillary: 142 mg/dL — ABNORMAL HIGH (ref 70–99)
Glucose-Capillary: 88 mg/dL (ref 70–99)
Glucose-Capillary: 111 mg/dL — ABNORMAL HIGH (ref 70–99)
Glucose-Capillary: 216 mg/dL — ABNORMAL HIGH (ref 70–99)

## 2024-02-22 SURGERY — ARTHROPLASTY, HIP, TOTAL, ANTERIOR APPROACH
Anesthesia: Monitor Anesthesia Care | Site: Hip | Laterality: Right

## 2024-02-22 MED ORDER — ONDANSETRON HCL 4 MG/2ML IJ SOLN
INTRAMUSCULAR | Status: DC | PRN
  Administered 2024-02-22: 4 mg via INTRAVENOUS

## 2024-02-22 MED ORDER — LIDOCAINE HCL (PF) 2 % IJ SOLN
INTRAMUSCULAR | Status: AC
  Filled 2024-02-22: qty 5

## 2024-02-22 MED ORDER — MENTHOL 3 MG MT LOZG: 1.0000 | LOZENGE | OROMUCOSAL | Status: DC | PRN

## 2024-02-22 MED ORDER — LACTATED RINGERS IV SOLN
INTRAVENOUS | Status: DC
Start: 2024-02-22 — End: 2024-02-22

## 2024-02-22 MED ORDER — BUPIVACAINE-EPINEPHRINE (PF) 0.25% -1:200000 IJ SOLN
INTRAMUSCULAR | Status: AC
Start: 2024-02-22 — End: ?
  Filled 2024-02-22: qty 30

## 2024-02-22 MED ORDER — ACETAMINOPHEN 500 MG PO TABS
1000.0000 mg | ORAL_TABLET | Freq: Four times a day (QID) | ORAL | Status: DC
  Administered 2024-02-22 – 2024-02-23 (×3): 1000 mg via ORAL
  Filled 2024-02-22 (×2): qty 2

## 2024-02-22 MED ORDER — BUPIVACAINE LIPOSOME 1.3 % IJ SUSP: 10.0000 mL | Freq: Once | INTRAMUSCULAR | Status: DC

## 2024-02-22 MED ORDER — TRANEXAMIC ACID-NACL 1000-0.7 MG/100ML-% IV SOLN
1000.0000 mg | INTRAVENOUS | Status: AC
  Administered 2024-02-22: 1000 mg via INTRAVENOUS
  Filled 2024-02-22: qty 100

## 2024-02-22 MED ORDER — FENTANYL CITRATE (PF) 100 MCG/2ML IJ SOLN
INTRAMUSCULAR | Status: AC
  Filled 2024-02-22: qty 2

## 2024-02-22 MED ORDER — AMISULPRIDE (ANTIEMETIC) 5 MG/2ML IV SOLN: 10.0000 mg | Freq: Once | INTRAVENOUS | Status: DC | PRN

## 2024-02-22 MED ORDER — CYCLOSPORINE 0.05 % OP EMUL
1.0000 [drp] | Freq: Two times a day (BID) | OPHTHALMIC | Status: DC
Start: 2024-02-22 — End: 2024-02-23
  Administered 2024-02-22 – 2024-02-23 (×2): 1 [drp] via OPHTHALMIC
  Filled 2024-02-22 (×2): qty 30

## 2024-02-22 MED ORDER — METHOCARBAMOL 1000 MG/10ML IJ SOLN: 500.0000 mg | Freq: Four times a day (QID) | INTRAMUSCULAR | Status: DC | PRN

## 2024-02-22 MED ORDER — ORAL CARE MOUTH RINSE: 15.0000 mL | Freq: Once | OROMUCOSAL | Status: AC

## 2024-02-22 MED ORDER — PHENOL 1.4 % MT LIQD: 1.0000 | OROMUCOSAL | Status: DC | PRN

## 2024-02-22 MED ORDER — BUPIVACAINE IN DEXTROSE 0.75-8.25 % IT SOLN
INTRATHECAL | Status: DC | PRN
  Administered 2024-02-22: 1.8 mL via INTRATHECAL

## 2024-02-22 MED ORDER — ARFORMOTEROL TARTRATE 15 MCG/2ML IN NEBU
15.0000 ug | INHALATION_SOLUTION | Freq: Two times a day (BID) | RESPIRATORY_TRACT | Status: DC
  Administered 2024-02-22 – 2024-02-23 (×2): 15 ug via RESPIRATORY_TRACT
  Filled 2024-02-22 (×2): qty 2

## 2024-02-22 MED ORDER — TRANEXAMIC ACID 1000 MG/10ML IV SOLN
INTRAVENOUS | Status: DC | PRN
  Administered 2024-02-22: 2000 mg via TOPICAL

## 2024-02-22 MED ORDER — FLEET ENEMA RE ENEM: 1.0000 | ENEMA | Freq: Once | RECTAL | Status: DC | PRN

## 2024-02-22 MED ORDER — DIPHENHYDRAMINE HCL 12.5 MG/5ML PO ELIX: 12.5000 mg | ORAL_SOLUTION | ORAL | Status: DC | PRN

## 2024-02-22 MED ORDER — LATANOPROST 0.005 % OP SOLN
1.0000 [drp] | Freq: Every day | OPHTHALMIC | Status: DC
  Administered 2024-02-22: 1 [drp] via OPHTHALMIC
  Filled 2024-02-22: qty 2.5

## 2024-02-22 MED ORDER — TRAMADOL HCL 50 MG PO TABS
50.0000 mg | ORAL_TABLET | Freq: Four times a day (QID) | ORAL | Status: DC
  Administered 2024-02-22 – 2024-02-23 (×3): 50 mg via ORAL
  Filled 2024-02-22 (×3): qty 1

## 2024-02-22 MED ORDER — OXYCODONE HCL 5 MG PO TABS
10.0000 mg | ORAL_TABLET | ORAL | Status: DC | PRN
  Administered 2024-02-23: 15 mg via ORAL
  Filled 2024-02-22: qty 3

## 2024-02-22 MED ORDER — POLYETHYLENE GLYCOL 3350 17 G PO PACK: 17.0000 g | PACK | Freq: Every day | ORAL | Status: DC | PRN

## 2024-02-22 MED ORDER — APIXABAN 2.5 MG PO TABS: 2.5000 mg | ORAL_TABLET | Freq: Two times a day (BID) | ORAL | 0 refills | Status: DC

## 2024-02-22 MED ORDER — PANTOPRAZOLE SODIUM 40 MG PO TBEC
40.0000 mg | DELAYED_RELEASE_TABLET | Freq: Every day | ORAL | Status: DC
  Administered 2024-02-23: 40 mg via ORAL
  Filled 2024-02-22: qty 1

## 2024-02-22 MED ORDER — EMPAGLIFLOZIN 25 MG PO TABS
25.0000 mg | ORAL_TABLET | Freq: Every day | ORAL | Status: DC
  Administered 2024-02-23: 25 mg via ORAL
  Filled 2024-02-22: qty 1

## 2024-02-22 MED ORDER — FENTANYL CITRATE (PF) 100 MCG/2ML IJ SOLN
INTRAMUSCULAR | Status: DC | PRN
  Administered 2024-02-22 (×2): 50 ug via INTRAVENOUS

## 2024-02-22 MED ORDER — ONDANSETRON HCL 4 MG/2ML IJ SOLN
INTRAMUSCULAR | Status: AC
  Filled 2024-02-22: qty 2

## 2024-02-22 MED ORDER — TRANEXAMIC ACID 1000 MG/10ML IV SOLN
2000.0000 mg | INTRAVENOUS | Status: DC
  Filled 2024-02-22: qty 20

## 2024-02-22 MED ORDER — UMECLIDINIUM BROMIDE 62.5 MCG/ACT IN AEPB
1.0000 | INHALATION_SPRAY | Freq: Every day | RESPIRATORY_TRACT | Status: DC
  Administered 2024-02-23: 1 via RESPIRATORY_TRACT
  Filled 2024-02-22: qty 7

## 2024-02-22 MED ORDER — BUPIVACAINE-EPINEPHRINE 0.25% -1:200000 IJ SOLN
INTRAMUSCULAR | Status: DC | PRN
  Administered 2024-02-22: 30 mL

## 2024-02-22 MED ORDER — BISACODYL 5 MG PO TBEC: 5.0000 mg | DELAYED_RELEASE_TABLET | Freq: Every day | ORAL | Status: DC | PRN

## 2024-02-22 MED ORDER — INSULIN ASPART 100 UNIT/ML IJ SOLN
0.0000 [IU] | Freq: Three times a day (TID) | INTRAMUSCULAR | Status: DC
  Administered 2024-02-23 (×2): 3 [IU] via SUBCUTANEOUS

## 2024-02-22 MED ORDER — OXYCODONE HCL 5 MG/5ML PO SOLN: 5.0000 mg | Freq: Once | ORAL | Status: AC | PRN

## 2024-02-22 MED ORDER — ONDANSETRON HCL 4 MG/2ML IJ SOLN: 4.0000 mg | Freq: Once | INTRAMUSCULAR | Status: DC | PRN

## 2024-02-22 MED ORDER — SODIUM CHLORIDE 0.9% FLUSH
INTRAVENOUS | Status: DC | PRN
  Administered 2024-02-22: 50 mL

## 2024-02-22 MED ORDER — OXYCODONE HCL 5 MG PO TABS
ORAL_TABLET | ORAL | Status: AC
  Filled 2024-02-22: qty 1

## 2024-02-22 MED ORDER — HYDROMORPHONE HCL 1 MG/ML IJ SOLN
INTRAMUSCULAR | Status: AC
  Filled 2024-02-22: qty 1

## 2024-02-22 MED ORDER — METOCLOPRAMIDE HCL 5 MG PO TABS: 5.0000 mg | ORAL_TABLET | Freq: Three times a day (TID) | ORAL | Status: DC | PRN

## 2024-02-22 MED ORDER — TRANEXAMIC ACID-NACL 1000-0.7 MG/100ML-% IV SOLN
1000.0000 mg | Freq: Once | INTRAVENOUS | Status: AC
  Administered 2024-02-22: 1000 mg via INTRAVENOUS
  Filled 2024-02-22: qty 100

## 2024-02-22 MED ORDER — METHOCARBAMOL 500 MG PO TABS
ORAL_TABLET | ORAL | Status: AC
  Filled 2024-02-22: qty 1

## 2024-02-22 MED ORDER — SODIUM CHLORIDE (PF) 0.9 % IJ SOLN
INTRAMUSCULAR | Status: AC
  Filled 2024-02-22: qty 50

## 2024-02-22 MED ORDER — OXYCODONE HCL 5 MG PO TABS
5.0000 mg | ORAL_TABLET | Freq: Once | ORAL | Status: AC | PRN
  Administered 2024-02-22: 5 mg via ORAL

## 2024-02-22 MED ORDER — PROPOFOL 1000 MG/100ML IV EMUL
INTRAVENOUS | Status: AC
  Filled 2024-02-22: qty 100

## 2024-02-22 MED ORDER — ACETAMINOPHEN 500 MG PO TABS
ORAL_TABLET | ORAL | Status: AC
Start: 2024-02-22 — End: 2024-02-23
  Filled 2024-02-22: qty 2

## 2024-02-22 MED ORDER — INSULIN ASPART 100 UNIT/ML IJ SOLN: 0.0000 [IU] | INTRAMUSCULAR | Status: DC | PRN

## 2024-02-22 MED ORDER — 0.9 % SODIUM CHLORIDE (POUR BTL) OPTIME
TOPICAL | Status: DC | PRN
Start: 2024-02-22 — End: 2024-02-22
  Administered 2024-02-22: 1000 mL

## 2024-02-22 MED ORDER — METHOCARBAMOL 500 MG PO TABS
500.0000 mg | ORAL_TABLET | Freq: Four times a day (QID) | ORAL | Status: DC | PRN
  Administered 2024-02-22 – 2024-02-23 (×2): 500 mg via ORAL
  Filled 2024-02-22: qty 1

## 2024-02-22 MED ORDER — LACTATED RINGERS IV SOLN: INTRAVENOUS | Status: DC

## 2024-02-22 MED ORDER — APIXABAN 2.5 MG PO TABS
2.5000 mg | ORAL_TABLET | Freq: Two times a day (BID) | ORAL | Status: DC
  Administered 2024-02-23: 2.5 mg via ORAL
  Filled 2024-02-22: qty 1

## 2024-02-22 MED ORDER — PROPOFOL 500 MG/50ML IV EMUL
INTRAVENOUS | Status: DC | PRN
  Administered 2024-02-22: 50 ug/kg/min via INTRAVENOUS

## 2024-02-22 MED ORDER — OXYCODONE-ACETAMINOPHEN 5-325 MG PO TABS: 1.0000 | ORAL_TABLET | ORAL | 0 refills | Status: AC | PRN

## 2024-02-22 MED ORDER — PROPOFOL 10 MG/ML IV BOLUS
INTRAVENOUS | Status: AC
  Filled 2024-02-22: qty 20

## 2024-02-22 MED ORDER — BUPIVACAINE LIPOSOME 1.3 % IJ SUSP
INTRAMUSCULAR | Status: AC
  Filled 2024-02-22: qty 10

## 2024-02-22 MED ORDER — PROPOFOL 10 MG/ML IV BOLUS
INTRAVENOUS | Status: DC | PRN
  Administered 2024-02-22 (×2): 20 mg via INTRAVENOUS

## 2024-02-22 MED ORDER — HYDROMORPHONE HCL 1 MG/ML IJ SOLN: 0.5000 mg | INTRAMUSCULAR | Status: DC | PRN

## 2024-02-22 MED ORDER — ONDANSETRON HCL 4 MG PO TABS: 4.0000 mg | ORAL_TABLET | Freq: Four times a day (QID) | ORAL | Status: DC | PRN

## 2024-02-22 MED ORDER — DEXAMETHASONE SODIUM PHOSPHATE 10 MG/ML IJ SOLN
10.0000 mg | Freq: Once | INTRAMUSCULAR | Status: AC
  Administered 2024-02-23: 10 mg via INTRAVENOUS
  Filled 2024-02-22: qty 1

## 2024-02-22 MED ORDER — DOCUSATE SODIUM 100 MG PO CAPS
100.0000 mg | ORAL_CAPSULE | Freq: Two times a day (BID) | ORAL | Status: DC
  Administered 2024-02-22 – 2024-02-23 (×2): 100 mg via ORAL
  Filled 2024-02-22 (×2): qty 1

## 2024-02-22 MED ORDER — BUPIVACAINE LIPOSOME 1.3 % IJ SUSP
INTRAMUSCULAR | Status: DC | PRN
  Administered 2024-02-22: 20 mL

## 2024-02-22 MED ORDER — KCL IN DEXTROSE-NACL 20-5-0.45 MEQ/L-%-% IV SOLN
INTRAVENOUS | Status: DC
  Filled 2024-02-22 (×2): qty 1000

## 2024-02-22 MED ORDER — PHENYLEPHRINE HCL-NACL 20-0.9 MG/250ML-% IV SOLN
INTRAVENOUS | Status: DC | PRN
  Administered 2024-02-22: 50 ug/min via INTRAVENOUS

## 2024-02-22 MED ORDER — ONDANSETRON HCL 4 MG/2ML IJ SOLN
4.0000 mg | Freq: Four times a day (QID) | INTRAMUSCULAR | Status: DC | PRN
  Administered 2024-02-22: 4 mg via INTRAVENOUS
  Filled 2024-02-22: qty 2

## 2024-02-22 MED ORDER — ALUM & MAG HYDROXIDE-SIMETH 200-200-20 MG/5ML PO SUSP: 30.0000 mL | ORAL | Status: DC | PRN

## 2024-02-22 MED ORDER — DEXAMETHASONE SODIUM PHOSPHATE 10 MG/ML IJ SOLN
INTRAMUSCULAR | Status: AC
  Filled 2024-02-22: qty 1

## 2024-02-22 MED ORDER — HYDROMORPHONE HCL 1 MG/ML IJ SOLN
0.2500 mg | INTRAMUSCULAR | Status: DC | PRN
  Administered 2024-02-22: 0.25 mg via INTRAVENOUS
  Administered 2024-02-22: 0.5 mg via INTRAVENOUS
  Administered 2024-02-22: 0.25 mg via INTRAVENOUS

## 2024-02-22 MED ORDER — LIDOCAINE HCL (PF) 2 % IJ SOLN
INTRAMUSCULAR | Status: DC | PRN
Start: 2024-02-22 — End: 2024-02-22
  Administered 2024-02-22: 20 mg via INTRADERMAL

## 2024-02-22 MED ORDER — OXYBUTYNIN CHLORIDE ER 5 MG PO TB24
10.0000 mg | ORAL_TABLET | Freq: Every day | ORAL | Status: DC
Start: 2024-02-23 — End: 2024-02-23
  Administered 2024-02-23: 10 mg via ORAL
  Filled 2024-02-22: qty 2

## 2024-02-22 MED ORDER — ACETAMINOPHEN 325 MG PO TABS: 325.0000 mg | ORAL_TABLET | Freq: Four times a day (QID) | ORAL | Status: DC | PRN

## 2024-02-22 MED ORDER — TIZANIDINE HCL 2 MG PO TABS: 2.0000 mg | ORAL_TABLET | Freq: Four times a day (QID) | ORAL | 0 refills | Status: AC | PRN

## 2024-02-22 MED ORDER — POVIDONE-IODINE 10 % EX SWAB: 2.0000 | Freq: Once | CUTANEOUS | Status: DC

## 2024-02-22 MED ORDER — UMECLIDINIUM-VILANTEROL 62.5-25 MCG/INH IN AEPB: 1.0000 | INHALATION_SPRAY | Freq: Every day | RESPIRATORY_TRACT | Status: DC

## 2024-02-22 MED ORDER — LISINOPRIL 5 MG PO TABS
2.5000 mg | ORAL_TABLET | Freq: Every day | ORAL | Status: DC
  Administered 2024-02-23: 2.5 mg via ORAL
  Filled 2024-02-22: qty 1

## 2024-02-22 MED ORDER — OXYCODONE HCL 5 MG PO TABS
5.0000 mg | ORAL_TABLET | ORAL | Status: DC | PRN
  Administered 2024-02-22: 5 mg via ORAL
  Filled 2024-02-22: qty 1

## 2024-02-22 MED ORDER — CHLORHEXIDINE GLUCONATE 0.12 % MT SOLN
15.0000 mL | Freq: Once | OROMUCOSAL | Status: AC
  Administered 2024-02-22: 15 mL via OROMUCOSAL

## 2024-02-22 MED ORDER — CEFAZOLIN SODIUM-DEXTROSE 2-4 GM/100ML-% IV SOLN
2.0000 g | INTRAVENOUS | Status: AC
  Administered 2024-02-22: 2 g via INTRAVENOUS
  Filled 2024-02-22: qty 100

## 2024-02-22 MED ORDER — METOCLOPRAMIDE HCL 5 MG/ML IJ SOLN
5.0000 mg | Freq: Three times a day (TID) | INTRAMUSCULAR | Status: DC | PRN
  Administered 2024-02-22: 10 mg via INTRAVENOUS
  Filled 2024-02-22: qty 2

## 2024-02-22 SURGICAL SUPPLY — 37 items
BAG COUNTER SPONGE SURGICOUNT (BAG) ×1 IMPLANT
BAG DECANTER FOR FLEXI CONT (MISCELLANEOUS) ×2 IMPLANT
BLADE SAW SGTL 18X1.27X75 (BLADE) ×1 IMPLANT
CABLE CERLAGE W/CRIMP 1.8 (Cable) IMPLANT
COVER PERINEAL POST (MISCELLANEOUS) ×1 IMPLANT
COVER SURGICAL LIGHT HANDLE (MISCELLANEOUS) ×1 IMPLANT
CUP GRIPTON 48MM 100 HIP (Hips) IMPLANT
DRAPE STERI IOBAN 125X83 (DRAPES) ×1 IMPLANT
DRAPE U-SHAPE 47X51 STRL (DRAPES) ×2 IMPLANT
DRSG AQUACEL AG ADV 3.5X10 (GAUZE/BANDAGES/DRESSINGS) ×1 IMPLANT
DURAPREP 26ML APPLICATOR (WOUND CARE) ×1 IMPLANT
ELECT BLADE TIP CTD 4 INCH (ELECTRODE) ×1 IMPLANT
ELECT REM PT RETURN 15FT ADLT (MISCELLANEOUS) ×1 IMPLANT
ELIMINATOR HOLE APEX DEPUY (Hips) IMPLANT
GLOVE BIO SURGEON STRL SZ7.5 (GLOVE) ×1 IMPLANT
GLOVE BIO SURGEON STRL SZ8.5 (GLOVE) ×1 IMPLANT
GLOVE BIOGEL PI IND STRL 8 (GLOVE) ×1 IMPLANT
GLOVE BIOGEL PI IND STRL 9 (GLOVE) ×1 IMPLANT
GOWN STRL REUS W/ TWL XL LVL3 (GOWN DISPOSABLE) ×2 IMPLANT
HEAD FEM STD 32X+1 STRL (Hips) IMPLANT
HOLDER FOLEY CATH W/STRAP (MISCELLANEOUS) ×1 IMPLANT
KIT TURNOVER KIT A (KITS) IMPLANT
MANIFOLD NEPTUNE II (INSTRUMENTS) ×1 IMPLANT
NDL HYPO 21X1.5 SAFETY (NEEDLE) ×2 IMPLANT
NEEDLE HYPO 21X1.5 SAFETY (NEEDLE) ×2 IMPLANT
NS IRRIG 1000ML POUR BTL (IV SOLUTION) ×1 IMPLANT
PACK ANTERIOR HIP CUSTOM (KITS) ×1 IMPLANT
PINN ALTRX NEUT ID X OD 32X48 IMPLANT
SPIKE FLUID TRANSFER (MISCELLANEOUS) ×1 IMPLANT
STEM FEMORAL SZ 6MM STD ACTIS (Stem) IMPLANT
SUT VIC AB 0 CT1 27XBRD ANBCTR (SUTURE) ×1 IMPLANT
SUT VIC AB 1 CTX36XBRD ANBCTR (SUTURE) ×1 IMPLANT
SUT VIC AB 2-0 CT1 TAPERPNT 27 (SUTURE) ×1 IMPLANT
SUT VICRYL+ 3-0 36IN CT-1 (SUTURE) ×1 IMPLANT
SYR CONTROL 10ML LL (SYRINGE) ×3 IMPLANT
TRAY FOLEY SLVR 14FR TEMP STAT (SET/KITS/TRAYS/PACK) IMPLANT
TUBE SUCTION HIGH CAP CLEAR NV (SUCTIONS) ×1 IMPLANT

## 2024-02-22 NOTE — Discharge Instructions (Signed)

## 2024-02-22 NOTE — Interval H&P Note (Signed)
 History and Physical Interval Note:  02/22/2024 10:44 AM  [name]  has presented today for surgery, with the diagnosis of RIGHT HIP OSTEOARTHRITIS.  The various methods of treatment have been discussed with the patient and family. After consideration of risks, benefits and other options for treatment, the patient has consented to  Procedure(s): RIGHT TOTAL HIP ARTHROPLASTY ANTERIOR APPROACH (Right) as a surgical intervention.  The patient's history has been reviewed, patient examined, no change in status, stable for surgery.  I have reviewed the patient's chart and labs.  Questions were answered to the patient's satisfaction.     Nestor Lewandowsky

## 2024-02-22 NOTE — Anesthesia Procedure Notes (Signed)
 Spinal  Patient location during procedure: OR Start time: 02/22/2024 12:42 PM End time: 02/22/2024 12:47 PM Reason for block: surgical anesthesia Staffing Performed: resident/CRNA  Anesthesiologist: Lannie Fields, DO Resident/CRNA: Florene Route, CRNA Performed by: Florene Route, CRNA Authorized by: Lannie Fields, DO   Preanesthetic Checklist Completed: patient identified, IV checked, site marked, risks and benefits discussed, surgical consent, monitors and equipment checked, pre-op evaluation and timeout performed Spinal Block Patient position: sitting Prep: DuraPrep and site prepped and draped Patient monitoring: heart rate, continuous pulse ox and blood pressure Approach: midline Location: L4-5 Injection technique: single-shot Needle Needle type: Pencan  Needle gauge: 24 G Needle length: 10 cm Assessment Sensory level: T6 Events: CSF return Additional Notes Kit expiration 06/13/2025 and lot #[id number] Clear free flow CSF, negative heme, negative paresthesia Tolerated well and returned to supine position

## 2024-02-22 NOTE — Op Note (Signed)
 PATIENT ID:      [name]  MRN:     [id number] DOB/AGE:    [birth year] / 83 y.o.  OPERATIVE REPORT   DATE OF PROCEDURE:  02/22/2024      PREOPERATIVE DIAGNOSIS:  RIGHT HIP OSTEOARTHRITIS                                                         POSTOPERATIVE DIAGNOSIS:  Same                                                         PROCEDURE: Anterior R total hip arthroplasty using a 48 mm DePuy Pinnacle  Cup, [employer], 0-degree polyethylene liner, a +1 mm x 32mm metal head, a 6 std Depuy Actis stem  SURGEON: Nestor Lewandowsky  ASSISTANT:   Tomi Likens. [employer]  (present throughout entire procedure and necessary for timely completion of the procedure)   ANESTHESIA: Spinal, Exparel 133mg  injection BLOOD LOSS: 300 cc FLUID REPLACEMENT: 1500 cc crystalloid TRANEXAMIC ACID: 1gm IV, 2gm Topical COMPLICATIONS: none    INDICATIONS FOR PROCEDURE: A 83 y.o. year-old With  RIGHT HIP OSTEOARTHRITIS   for 2 years, x-rays show bone-on-bone arthritic changes, and osteophytes. Despite conservative measures with observation, anti-inflammatory medicine, narcotics, use of a cane, has severe unremitting pain and can ambulate only a few blocks before resting. Patient desires elective R total hip arthroplasty to decrease pain and increase function. The risks, benefits, and alternatives were discussed at length including but not limited to the risks of infection, bleeding, nerve injury, stiffness, blood clots, the need for revision surgery, cardiopulmonary complications, among others, and they were willing to proceed. Questions answered      PROCEDURE IN DETAIL: The patient was identified by armband, received preoperative IV antibiotics, in the holding area at [facility], taken to the operating room , appropriate anesthetic monitors were attached and anesthesia was induced with the patient on the gurney. HANA boots were applied to the feet, and the patient  was transferred to the HANA table with  a peroneal post and support underneath the non-operative leg. The operative lower extremity was then prepped and draped in the usual sterile fashion from just above the iliac crest to the knee. And a timeout procedure was performed. Tomi Likens. [facility] was present and scrubbed throughout the case, critical for assistance with, positioning, exposure, retraction, instrumentation, and closure.Skin along incision area was injected with 10 cc of Exparel solution. We then made a 12 cm incision along the interval at the leading edge of the tensor fascia lata of starting at 2 cm lateral to the ASIS. Small bleeders in the skin and subcutaneous tissue identified and cauterized we dissected down to the fascia and made an incision in the fascia allowing [place] to elevate the fascia of the tensor muscle and exploited the interval between the rectus and the tensor fascia lata. A Cobra retractor was then placed along the superior neck of the femur. A cerebellar retractor was used to expose the interval between the tensor fascia lata and the rectus femoris.  We identified and cauterized the ascending  branch of the anterior circumflex artery. A second Cobra retractor along the inferior neck of the femur. A small Hohmann retractor was placed underneath the origin of the rectus femoris, giving [place] good medial exposure. Using Ronguers fatty tissue was removed from in front of the anterior capsule. The capsule was then incised, starting out at the superior anterior rim of the acetabulum going laterally along the anterior neck. The capsule was then teed along the neck superiorly and inferiorly. Electrocautery was used to release capsule from the anterior and medial neck of the femur to allow external rotation. The cobra retractors were then placed along the inferior and superior neck. The hip was externally rotated to 40 degrees, traction applied and locked. We perform a standard neck cut with the oscillating saw and removed the femoral head  with a power corkscrew. We then placed a medium curved medium homan retractor in the cotyloid notch and standard cobra retractor posteriorly along the acetabular rim. Exposed labral tissue and osteophytes were then removed. We then sequentially reamed up to a 47 mm basket reamer obtaining good coverage in all quadrants, verified by C-arm imaging. Under C-arm control we then hammered into place a 48 mm Gryption cup in 45 of abduction and 20 of anteversion. The cup seated nicely and required no supplemental screws. We then placed a central hole Eliminator and a +4 mm, 0 polyethylene liner. The foot was then externally rotated to 130-140. The limb was extended and adducted to the floor, delivering the proximal femur up into the wound. A medium curved Hohmann retractor was placed over the greater trochanter and a long Homan retractor along the posterior femoral neck completing the exposure and lateralizing the femur. We then performed releases superiorly and and inferiorly of the capsule going back to the pirformis fossa superiorly and to the lesser trochanter inferiorly. We then entered the proximal femur with the box cutting offset chisel followed by, a canal sounder, the chili pepper and broaching up to a 6 broach. This seated nicely and we reamed the calcar. A trial reduction was performed with a 1 mm X 32 mm head.The limb lengths were checked by c-arm, and the hip was stable in 90 of external rotation. At this point the trial components removed and we hammered into place a std  Offset # 6 Actis stem.  After driving the stem a small nondisplaced possible crack in the calcar was noted.  A 2 mm Zimmer cerclage cable was applied prophylactically to prevent any diastases of the crack. A + 1 mm x 32 metal head was then hammered into place. The hip was reduced and final C-arm images obtained. The wound was thoroughly irrigated with normal saline solution. We repaired the ant capsule and the tensor fascia lot a with  running 0 vicryl suture. the subcutaneous and subcuticular layers were closed with running 3-0 Vicryl suture followed by an Aquacil dressing. At this point the patient was awaken and transferred to hospital gurney without difficulty.   Nestor Lewandowsky 02/22/2024, 10:45 AM

## 2024-02-22 NOTE — Transfer of Care (Signed)
 Immediate Anesthesia Transfer of Care Note  Patient: [name]  Procedure(s) Performed: RIGHT TOTAL HIP ARTHROPLASTY ANTERIOR APPROACH (Right: Hip)  Patient Location: PACU  Anesthesia Type:Spinal  Level of Consciousness: drowsy  Airway & Oxygen Therapy: Patient Spontanous Breathing and Patient connected to face mask oxygen  Post-op Assessment: Report given to RN and Post -op Vital signs reviewed and stable  Post vital signs: Reviewed and stable  Last Vitals:  Vitals Value Taken Time  BP    Temp    Pulse 82 02/22/24 1537  Resp 13 02/22/24 1537  SpO2 98 % 02/22/24 1537  Vitals shown include unfiled device data.  Last Pain:  Vitals:   02/22/24 1056  TempSrc: Oral  PainSc:          Complications: No notable events documented.

## 2024-02-22 NOTE — Plan of Care (Signed)
 Problem: Education: Goal: Knowledge of General Education information will improve Description: Including pain rating scale, medication(s)/side effects and non-pharmacologic comfort measures Outcome: Progressing   Problem: Clinical Measurements: Goal: Ability to maintain clinical measurements within normal limits will improve Outcome: Progressing   Problem: Coping: Goal: Level of anxiety will decrease Outcome: Progressing   Problem: Pain Managment: Goal: General experience of comfort will improve and/or be controlled Outcome: Progressing   Problem: Safety: Goal: Ability to remain free from injury will improve Outcome: Progressing   Haydee Salter, RN 02/22/24 6:25 PM

## 2024-02-23 ENCOUNTER — Encounter (HOSPITAL_COMMUNITY): Payer: Self-pay | Admitting: Orthopedic Surgery

## 2024-02-23 DIAGNOSIS — I10 Essential (primary) hypertension: Secondary | ICD-10-CM | POA: Diagnosis not present

## 2024-02-23 DIAGNOSIS — J439 Emphysema, unspecified: Secondary | ICD-10-CM | POA: Diagnosis not present

## 2024-02-23 DIAGNOSIS — M1611 Unilateral primary osteoarthritis, right hip: Secondary | ICD-10-CM | POA: Diagnosis not present

## 2024-02-23 DIAGNOSIS — Z79899 Other long term (current) drug therapy: Secondary | ICD-10-CM | POA: Diagnosis not present

## 2024-02-23 DIAGNOSIS — E119 Type 2 diabetes mellitus without complications: Secondary | ICD-10-CM | POA: Diagnosis not present

## 2024-02-23 DIAGNOSIS — M25751 Osteophyte, right hip: Secondary | ICD-10-CM | POA: Diagnosis not present

## 2024-02-23 LAB — GLUCOSE, CAPILLARY
Glucose-Capillary: 173 mg/dL — ABNORMAL HIGH (ref 70–99)
Glucose-Capillary: 178 mg/dL — ABNORMAL HIGH (ref 70–99)

## 2024-02-23 NOTE — TOC Initial Note (Signed)
 Transition of Care [facility]) - Initial/Assessment Note    Patient Details  Name: [name] MRN: [id number] Date of Birth: [birth year]  Transition of Care [facility]) CM/SW Contact:    Howell Rucks, RN Phone Number: 02/23/2024, 11:32 AM  Clinical Narrative:   Met with pt and spouse at bedside to review dc therapy and home DME needs, pt's spouse confirms HH PT w/[facility], reports pt has RW, no home DME needs. No TOC needs.                   Barriers to Discharge: No Barriers Identified   Patient Goals and CMS Choice Patient states their goals for this hospitalization and ongoing recovery are:: return home          Expected Discharge Plan and Services                                   HH Arranged: PT HH Agency: [facility]        Prior Living Arrangements/Services                       Activities of Daily Living   ADL Screening (condition at time of admission) Independently performs ADLs?: Yes (appropriate for developmental age) Is the patient deaf or have difficulty hearing?: No Does the patient have difficulty seeing, even when wearing glasses/contacts?: No Does the patient have difficulty concentrating, remembering, or making decisions?: No  Permission Sought/Granted                  Emotional Assessment              Admission diagnosis:  S/P total right hip arthroplasty [Z96.641] Patient Active Problem List   Diagnosis Date Noted   S/P total right hip arthroplasty 02/22/2024   Osteoarthritis of right hip 02/17/2024   DOE (dyspnea on exertion) 09/20/2019   Emphysema of lung (HCC) 09/20/2019   Gait abnormality 09/20/2019   Aneurysm of ascending aorta (HCC) 06/11/2018   Essential hypertension 11/20/2017   CAD (coronary artery disease), native coronary artery 11/20/2017   Tobacco abuse 11/20/2017   PCP:  Laurann Montana, MD Pharmacy:   [facility] [phone] - [place], [place] - [address] AT [facility]  [address] [place] [address] Phone: [phone] Fax: [phone]     Social Drivers of Health (SDOH) Social History: SDOH Screenings   Food Insecurity: No Food Insecurity (02/22/2024)  Housing: Low Risk  (02/22/2024)  Transportation Needs: No Transportation Needs (02/22/2024)  Utilities: Not At Risk (02/22/2024)  Depression (PHQ2-9): Low Risk  (03/19/2020)  Social Connections: Unknown (02/22/2024)  Tobacco Use: Medium Risk (02/22/2024)   SDOH Interventions:     Readmission Risk Interventions     No data to display

## 2024-02-23 NOTE — Plan of Care (Signed)
  Problem: Coping: Goal: Level of anxiety will decrease Outcome: Progressing   Problem: Safety: Goal: Ability to remain free from injury will improve Outcome: Progressing   Problem: Activity: Goal: Ability to avoid complications of mobility impairment will improve Outcome: Progressing

## 2024-02-23 NOTE — Plan of Care (Signed)
 Patient discharged home via private vehicle with husband. AVS and discharge instruction provided. Patient verbalizes understanding. Haydee Salter, RN 02/23/24 4:31 PM

## 2024-02-23 NOTE — Progress Notes (Signed)
 Physical Therapy Treatment Patient Details Name: [name] MRN: [id number] DOB: 23-Jun-1941 Today's Date: 02/23/2024   History of Present Illness 83 yo female s/p R DA THA on 02/22/24. PMH: CAD, AAA, COPD. DM, Crohn,s    PT Comments  Pt progressing well this pm, meetings; spouse supportive and able to provide CGA as needed as well as cue pt for correct technique. Pt is ready to d/c from PT standpoint with  spouse assisting prn; pt experiencing some mild nausea therefore will defer final d/c to pt and nursing staff   If plan is discharge home, recommend the following: A little help with walking and/or transfers;A little help with bathing/dressing/bathroom;Help with stairs or ramp for entrance;Assist for transportation;Assistance with cooking/housework   Can travel by private vehicle        Equipment Recommendations  None recommended by PT    Recommendations for Other Services       Precautions / Restrictions Precautions Precautions: Fall Restrictions Weight Bearing Restrictions Per Provider Order: No RLE Weight Bearing Per Provider Order: Weight bearing as tolerated     Mobility  Bed Mobility Overal bed mobility: Needs Assistance Bed Mobility: Supine to Sit, Sit to Supine     Supine to sit: Min assist, Contact guard Sit to supine: Contact guard assist, Min assist   General bed mobility comments: assist to initiate RLE movement, able to use gait belt for progression on an doff bed; cues for technique and positioning    Transfers Overall transfer level: Needs assistance Equipment used: Rolling walker (2 wheels) Transfers: Sit to/from Stand Sit to Stand: Contact guard assist           General transfer comment: cues for hand placement, CGAto rise and transition to RW; husband present and able to cue    Ambulation/Gait Ambulation/Gait assistance: Contact guard assist, Supervision Gait Distance (Feet): 85 Feet Assistive device: Rolling walker (2 wheels) Gait  Pattern/deviations: Step-to pattern, Decreased stance time - right Gait velocity: decr     General Gait Details: gait stability improved, incr wt shift to RLE with no rest breaks needed. husband present, able to provide needed assist   Stairs             Wheelchair Mobility     Tilt Bed    Modified Rankin (Stroke Patients Only)       Balance Overall balance assessment: Needs assistance Sitting-balance support: No upper extremity supported, Feet supported Sitting balance-Leahy Scale: Fair     Standing balance support: During functional activity, Reliant on assistive device for balance Standing balance-Leahy Scale: Poor                              Communication Communication Communication: No apparent difficulties Factors Affecting Communication: Hearing impaired  Cognition Arousal: Alert Behavior During Therapy: WFL for tasks assessed/performed   PT - Cognitive impairments: No apparent impairments                         Following commands: Intact      Cueing Cueing Techniques: Verbal cues  Exercises Total Joint Exercises Ankle Circles/Pumps: AROM, Both, 5 reps Quad Sets: AROM, Both, 5 reps Heel Slides: AAROM, Right, 5 reps    General Comments        Pertinent Vitals/Pain Pain Assessment Pain Assessment: 0-10 Pain Score: 4  Pain Location: right hip Pain Descriptors / Indicators: Discomfort, Grimacing, Guarding, Sore Pain Intervention(s): Limited activity within patient's  tolerance, Monitored during session, Premedicated before session, Repositioned, Ice applied    Home Living                          Prior Function            PT Goals (current goals can now be found in the care plan section) Acute Rehab PT Goals PT Goal Formulation: With patient Time For Goal Achievement: 03/01/24 Potential to Achieve Goals: Good Progress towards PT goals: Progressing toward goals    Frequency    7X/week      PT  Plan      Co-evaluation              AM-PAC PT "6 Clicks" Mobility   Outcome Measure  Help needed turning from your back to your side while in a flat bed without using bedrails?: A Little Help needed moving from lying on your back to sitting on the side of a flat bed without using bedrails?: A Little Help needed moving to and from a bed to a chair (including a wheelchair)?: A Little Help needed standing up from a chair using your arms (e.g., wheelchair or bedside chair)?: A Little Help needed to walk in hospital room?: A Little Help needed climbing 3-5 steps with a railing? : A Little 6 Click Score: 18    End of Session Equipment Utilized During Treatment: Gait belt Activity Tolerance: Patient tolerated treatment well Patient left: in bed;with call bell/phone within reach;with family/visitor present (unable to set alarm)   PT Visit Diagnosis: Other abnormalities of gait and mobility (R26.89);History of falling (Z91.81)     Time: 5784-6962 PT Time Calculation (min) (ACUTE ONLY): 27 min  Charges:    $Gait Training: 23-37 mins PT General Charges $$ ACUTE PT VISIT: 1 Visit                     Argil Mahl, PT  Acute Rehab Dept [facility]) [phone]  02/23/2024    [facility] 02/23/2024, 2:29 PM

## 2024-02-23 NOTE — Discharge Summary (Signed)
 Patient ID: [name] MRN: [id number] DOB/AGE: [birth year] 83 y.o.  Admit date: 02/22/2024 Discharge date: 02/23/2024  Admission Diagnoses:  Principal Problem:   Osteoarthritis of right hip Active Problems:   S/P total right hip arthroplasty   Discharge Diagnoses:  Same  Past Medical History:  Diagnosis Date   Aneurysm of ascending aorta (HCC) 06/11/2018   4.1cm on CT 11/2017.   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 female who was admitted 02/22/2024 for operative treatment ofOsteoarthritis of right hip. Patient has severe unremitting pain that affects sleep, daily activities, and work/hobbies. After pre-op clearance the patient was taken to the operating room on 02/22/2024 and underwent  Procedure(s): RIGHT TOTAL HIP ARTHROPLASTY ANTERIOR APPROACH.    Patient was given perioperative antibiotics:  Anti-infectives (From admission, onward)    Start     Dose/Rate Route Frequency Ordered Stop   02/22/24 1030  ceFAZolin (ANCEF) IVPB 2g/100 mL premix        2 g 200 mL/hr over 30 Minutes Intravenous On call to O.R. 02/22/24 1026 02/22/24 1259        Patient was given sequential compression devices, early ambulation, and chemoprophylaxis to prevent DVT.  Patient benefited maximally from hospital stay and there were no complications.    Recent vital signs: Patient Vitals for the past 24 hrs:  BP Temp  Temp src Pulse Resp SpO2  02/23/24 0947 119/78 97.7 F (36.5 C) Oral 74 17 93 %  02/23/24 0905 -- -- -- -- -- 90 %  02/23/24 0551 (!) 149/92 98.4 F (36.9 C) Oral 97 18 91 %  02/23/24 0139 (!) 151/82 97.9 F (36.6 C) -- 81 15 96 %  02/22/24 2104 (!) 144/73 97.7 F (36.5 C) -- 81 16 94 %  02/22/24 1959 -- -- -- -- -- 99 %  02/22/24 1814 (!) 140/88 (!) 97.5 F (36.4 C) Oral 79 16 99 %  02/22/24 1730 (!) 141/80 -- -- 85 17 94 %  02/22/24 1715 (!) 156/84 -- -- 81 17 94 %  02/22/24 1700 114/70 -- -- 83 16 93 %  02/22/24 1649 -- -- -- 78 17 93 %  02/22/24 1645 113/78 -- -- 82 14 93 %  02/22/24 1630 109/72 97.6 F (36.4 C) -- 77 12 92 %  02/22/24 1622 -- -- -- 69 13 91 %  02/22/24 1615 114/71 -- -- 81 15 94 %  02/22/24 1600 (!) 88/59 -- -- 80 15 92 %  02/22/24 1545 96/66 -- -- 81 16 96 %  02/22/24 1535 104/68 97.7 F (36.5 C) -- 72 15 97 %     Recent laboratory studies: No results for input(s): "WBC", "HGB", "HCT", "PLT", "NA", "K", "CL", "CO2", "BUN", "CREATININE", "GLUCOSE", "INR", "CALCIUM" in the last 72 hours.  Invalid input(s): "PT", "2"   Discharge Medications:   Allergies as of 02/23/2024  Reactions   Crestor [rosuvastatin]    myalgia   Metformin Hcl Er    Other Reaction(s): fecal incontinence        Medication List     PAUSE taking these medications    Eliquis 5 MG Tabs tablet Wait to take this until: March 07, 2024 Generic drug: apixaban Take 5 mg by mouth 2 (two) times daily. You also have another medication with the same name that you may need to continue taking.       TAKE these medications    atorvastatin 40 MG tablet Commonly known as: LIPITOR Take 40 mg by mouth daily.   Benefiber Chew Chew 2 tablets by mouth daily as needed (regulation).   BIOTIN PO Take 1 tablet by mouth daily.   calcium-vitamin D 500-200 MG-UNIT tablet Commonly known as: OSCAL WITH D Take 1 tablet by mouth daily with breakfast.   COLLAGEN PO Take 1 tablet by  mouth daily.   apixaban 2.5 MG Tabs tablet Commonly known as: Eliquis Take 1 tablet (2.5 mg total) by mouth 2 (two) times daily. What changed: Another medication with the same name was paused. Ask your nurse or doctor if you should take this medication.   empagliflozin 25 MG Tabs tablet Commonly known as: JARDIANCE Take 25 mg by mouth daily.   Fish Oil 1000 MG Caps Take 1,000 mg by mouth daily.   latanoprost 0.005 % ophthalmic solution Commonly known as: XALATAN Place 1 drop into both eyes at bedtime.   lisinopril 2.5 MG tablet Commonly known as: ZESTRIL Take 2.5 mg by mouth daily.   ondansetron 4 MG disintegrating tablet Commonly known as: ZOFRAN-ODT Take 4 mg by mouth every 8 (eight) hours as needed for vomiting or nausea.   oxybutynin 10 MG 24 hr tablet Commonly known as: DITROPAN-XL Take 10 mg by mouth daily.   oxyCODONE-acetaminophen 5-325 MG tablet Commonly known as: PERCOCET/ROXICET Take 1 tablet by mouth every 4 (four) hours as needed for severe pain (pain score 7-10).   Ozempic (0.25 or 0.5 MG/DOSE) 2 MG/3ML Sopn Generic drug: Semaglutide(0.25 or 0.5MG /DOS) Inject 0.5 mg into the skin every Thursday.   Restasis 0.05 % ophthalmic emulsion Generic drug: cycloSPORINE Place 1 drop into both eyes 2 (two) times daily.   tiZANidine 2 MG tablet Commonly known as: ZANAFLEX Take 1 tablet (2 mg total) by mouth every 6 (six) hours as needed for muscle spasms.   traMADol 50 MG tablet Commonly known as: ULTRAM Take 50 mg by mouth 4 (four) times daily as needed for severe pain (pain score 7-10).   umeclidinium-vilanterol 62.5-25 MCG/INH Aepb Commonly known as: ANORO ELLIPTA Inhale 1 puff into the lungs daily.   Vitamin B-12 5000 MCG Tbdp Take 5,000 mcg by mouth daily.   Vitamin D-3 125 MCG (5000 UT) Tabs Take 5,000 Units by mouth daily.   Womens Multi Vitamin & Mineral Tabs Take 1 tablet by mouth daily.               Durable Medical Equipment  (From  admission, onward)           Start     Ordered   02/22/24 1745  DME Walker rolling  Once       Question:  Patient needs a walker to treat with the following condition  Answer:  Status post right hip replacement   02/22/24 1744   02/22/24 1745  DME 3 n 1  Once        02/22/24 1744  Discharge Care Instructions  (From admission, onward)           Start     Ordered   02/23/24 0000  Weight bearing as tolerated        02/23/24 1143            Diagnostic Studies: DG HIP UNILAT WITH PELVIS 1V RIGHT Result Date: 02/22/2024 CLINICAL DATA:  Elective surgery. EXAM: DG HIP (WITH OR WITHOUT PELVIS) 1V RIGHT COMPARISON:  None Available. FINDINGS: Nine fluoroscopic spot views of the pelvis and right hip obtained in the operating room. Sequential images during hip arthroplasty. Cerclage wire about the proximal femoral stem. Fluoroscopy time 18 seconds. Dose 1.7 mGy. IMPRESSION: Intraoperative fluoroscopy during right hip arthroplasty. Electronically Signed   By: Narda Rutherford M.D.   On: 02/22/2024 14:54   DG C-Arm 1-60 Min-No Report Result Date: 02/22/2024 Fluoroscopy was utilized by the requesting physician.  No radiographic interpretation.   DG C-Arm 1-60 Min-No Report Result Date: 02/22/2024 Fluoroscopy was utilized by the requesting physician.  No radiographic interpretation.    Disposition: Discharge disposition: 01-Home or Self Care       Discharge Instructions     Call MD / Call 911   Complete by: As directed    If you experience chest pain or shortness of breath, CALL 911 and be transported to the hospital emergency room.  If you develope a fever above 101 F, pus (white drainage) or increased drainage or redness at the wound, or calf pain, call your surgeon's office.   Constipation Prevention   Complete by: As directed    Drink plenty of fluids.  Prune juice may be helpful.  You may use a stool softener, such as Colace (over the counter) 100 mg twice  a day.  Use MiraLax (over the counter) for constipation as needed.   Diet - low sodium heart healthy   Complete by: As directed    Driving restrictions   Complete by: As directed    No driving for 2 weeks   Increase activity slowly as tolerated   Complete by: As directed    Patient may shower   Complete by: As directed    You may shower without a dressing once there is no drainage.  Do not wash over the wound.  If drainage remains, cover wound with plastic wrap and then shower.   Post-operative opioid taper instructions:   Complete by: As directed    POST-OPERATIVE OPIOID TAPER INSTRUCTIONS: It is important to wean off of your opioid medication as soon as possible. If you do not need pain medication after your surgery it is ok to stop day one. Opioids include: Codeine, Hydrocodone(Norco, Vicodin), Oxycodone(Percocet, oxycontin) and hydromorphone amongst others.  Long term and even short term use of opiods can cause: Increased pain response Dependence Constipation Depression Respiratory depression And more.  Withdrawal symptoms can include Flu like symptoms Nausea, vomiting And more Techniques to manage these symptoms Hydrate well Eat regular healthy meals Stay active Use relaxation techniques(deep breathing, meditating, yoga) Do Not substitute Alcohol to help with tapering If you have been on opioids for less than two weeks and do not have pain than it is ok to stop all together.  Plan to wean off of opioids This plan should start within one week post op of your joint replacement. Maintain the same interval or time between taking each dose and first decrease the dose.  Cut the total daily intake of opioids by one tablet each day Next  start to increase the time between doses. The last dose that should be eliminated is the evening dose.      Weight bearing as tolerated   Complete by: As directed         Follow-up Information     Gean Birchwood, MD. Go on 03/03/2024.    Specialty: Orthopedic Surgery Why: Your appointment is scheduled for 9:45. Contact information: [address] [place] [address] [phone]         [facility] Follow up.   Why: HHPT will provide 6 home visits prior to starting outpatient physical therapy        [facility] Go on 03/03/2024.   Why: Your outpatient physical therapy is scheduled for 11:20. Contact information: Physical Therapy [address] [place] [address] [phone]                  Signed: Dannielle Burn 02/23/2024, 11:44 AM

## 2024-02-23 NOTE — Evaluation (Signed)
 Physical Therapy Evaluation Patient Details Name: [name] MRN: [id number] DOB: 01-12-1941 Today's Date: 02/23/2024  History of Present Illness  83 yo female s/p R DA THA on 02/22/24. PMH: CAD, AAA, COPD. DM, Crohn,s  Clinical Impression  Pt is s/p THA resulting in the deficits listed below (see PT Problem List).  Pt reports nausea improved. Amb 54' with RW however requiring min assist throughout for balance and safety. Will see again in pm to determine readiness for d/c with spouse  Pt will benefit from acute skilled PT to increase their independence and safety with mobility to facilitate discharge.          If plan is discharge home, recommend the following: A little help with walking and/or transfers;A little help with bathing/dressing/bathroom;Help with stairs or ramp for entrance;Assist for transportation;Assistance with cooking/housework   Can travel by private vehicle        Equipment Recommendations None recommended by PT  Recommendations for Other Services       Functional Status Assessment Patient has had a recent decline in their functional status and demonstrates the ability to make significant improvements in function in a reasonable and predictable amount of time.     Precautions / Restrictions Precautions Precautions: Fall Restrictions Weight Bearing Restrictions Per Provider Order: No RLE Weight Bearing Per Provider Order: Weight bearing as tolerated      Mobility  Bed Mobility Overal bed mobility: Needs Assistance Bed Mobility: Supine to Sit     Supine to sit: Min assist, Mod assist     General bed mobility comments: assist with LEs, trunk elevation, incr time and effort    Transfers Overall transfer level: Needs assistance Equipment used: Rolling walker (2 wheels) Transfers: Sit to/from Stand Sit to Stand: Min assist           General transfer comment: cues for hand placement, assist to rise and transition to RW     Ambulation/Gait Ambulation/Gait assistance: Min assist Gait Distance (Feet): 50 Feet Assistive device: Rolling walker (2 wheels) Gait Pattern/deviations: Step-to pattern Gait velocity: decr     General Gait Details: overall unsteady gait, 3 standing rests with pt repeatedly holding rail in hallway with cues needed to maintain grasp on RW as well as step sequence  Stairs            Wheelchair Mobility     Tilt Bed    Modified Rankin (Stroke Patients Only)       Balance Overall balance assessment: Needs assistance Sitting-balance support: No upper extremity supported, Feet supported Sitting balance-Leahy Scale: Fair     Standing balance support: During functional activity, Reliant on assistive device for balance Standing balance-Leahy Scale: Poor                               Pertinent Vitals/Pain Pain Assessment Pain Assessment: 0-10 Pain Score: 4  Pain Location: right hip Pain Intervention(s): Limited activity within patient's tolerance, Monitored during session, Premedicated before session    Home Living Family/patient expects to be discharged to:: Private residence Living Arrangements: Spouse/significant other Available Help at Discharge: Family Type of Home: House Home Access: Stairs to enter   Secretary/administrator of Steps: 3 inch step   Home Layout: One level Home Equipment: Agricultural consultant (2 wheels);Wheelchair - manual      Prior Function Prior Level of Function : Independent/Modified Independent  Extremity/Trunk Assessment   Upper Extremity Assessment Upper Extremity Assessment: Overall WFL for tasks assessed    Lower Extremity Assessment Lower Extremity Assessment: RLE deficits/detail RLE Deficits / Details: ankle WFL, knee and hip grossly  2+/5 limtied by anticipated post op changes       Communication   Communication Communication: No apparent difficulties Factors Affecting  Communication: Hearing impaired    Cognition Arousal: Alert Behavior During Therapy: WFL for tasks assessed/performed   PT - Cognitive impairments: No apparent impairments                         Following commands: Intact       Cueing Cueing Techniques: Verbal cues     General Comments      Exercises Total Joint Exercises Ankle Circles/Pumps: AROM, Both, 5 reps   Assessment/Plan    PT Assessment Patient needs continued PT services  PT Problem List Decreased strength;Decreased range of motion;Decreased activity tolerance;Decreased balance;Decreased knowledge of use of DME;Decreased mobility;Pain;Decreased safety awareness;Decreased knowledge of precautions       PT Treatment Interventions DME instruction;Therapeutic exercise;Gait training;Functional mobility training;Therapeutic activities;Patient/family education    PT Goals (Current goals can be found in the Care Plan section)  Acute Rehab PT Goals PT Goal Formulation: With patient Time For Goal Achievement: 03/01/24 Potential to Achieve Goals: Good    Frequency 7X/week     Co-evaluation               AM-PAC PT "6 Clicks" Mobility  Outcome Measure Help needed turning from your back to your side while in a flat bed without using bedrails?: A Little Help needed moving from lying on your back to sitting on the side of a flat bed without using bedrails?: A Little Help needed moving to and from a bed to a chair (including a wheelchair)?: A Little Help needed standing up from a chair using your arms (e.g., wheelchair or bedside chair)?: A Little Help needed to walk in hospital room?: A Little Help needed climbing 3-5 steps with a railing? : A Lot 6 Click Score: 17    End of Session Equipment Utilized During Treatment: Gait belt Activity Tolerance: Patient tolerated treatment well Patient left: with call bell/phone within reach;in chair;with family/visitor present;with chair alarm set   PT Visit  Diagnosis: Other abnormalities of gait and mobility (R26.89);History of falling (Z91.81)    Time: 5366-4403 PT Time Calculation (min) (ACUTE ONLY): 21 min   Charges:   PT Evaluation $PT Eval Low Complexity: 1 Low   PT General Charges $$ ACUTE PT VISIT: 1 Visit         Taya Ashbaugh, PT  Acute Rehab Dept [facility]) [phone]  02/23/2024   [facility] 02/23/2024, 11:35 AM

## 2024-02-23 NOTE — Anesthesia Postprocedure Evaluation (Signed)
 Anesthesia Post Note  Patient: Cayman Brogden  Procedure(s) Performed: RIGHT TOTAL HIP ARTHROPLASTY ANTERIOR APPROACH (Right: Hip)     Patient location during evaluation: PACU Anesthesia Type: MAC and Spinal Level of consciousness: awake and alert and oriented Pain management: pain level controlled Vital Signs Assessment: post-procedure vital signs reviewed and stable Respiratory status: spontaneous breathing, nonlabored ventilation and respiratory function stable Cardiovascular status: blood pressure returned to baseline and stable Postop Assessment: no headache, no backache, spinal receding and patient able to bend at knees Anesthetic complications: no   No notable events documented.  Last Vitals:  Vitals:   02/23/24 0139 02/23/24 0551  BP: (!) 151/82 (!) 149/92  Pulse: 81 97  Resp: 15 18  Temp: 36.6 C 36.9 C  SpO2: 96% 91%    Last Pain:  Vitals:   02/23/24 0556  TempSrc:   PainSc: 4                  Lannie Fields

## 2024-02-23 NOTE — Progress Notes (Signed)
 PATIENT ID: [name]  MRN: [id number]  DOB/AGE:  1941-06-26 / 83 y.o.  1 Day Post-Op Procedure(s) (LRB): RIGHT TOTAL HIP ARTHROPLASTY ANTERIOR APPROACH (Right)    PROGRESS NOTE Subjective: Patient is alert, oriented, no Nausea, no Vomiting, yes passing gas, . Taking PO well. Denies SOB, Chest or Calf Pain. Using Incentive Spirometer, PAS in place. Ambulate WBAT Patient reports pain as  2/10. N&V yesterday none this AM.    Objective: Vital signs in last 24 hours: Vitals:   02/22/24 1959 02/22/24 2104 02/23/24 0139 02/23/24 0551  BP:  (!) 144/73 (!) 151/82 (!) 149/92  Pulse:  81 81 97  Resp:  16 15 18   Temp:  97.7 F (36.5 C) 97.9 F (36.6 C) 98.4 F (36.9 C)  TempSrc:    Oral  SpO2: 99% 94% 96% 91%  Weight:      Height:          Intake/Output from previous day: I/O last 3 completed shifts: In: 1957.6 [P.O.:477; I.V.:1203.6; IV Piggyback:277] Out: 1200 [Urine:875; Blood:325]   Intake/Output this shift: Total I/O In: 872.6 [P.O.:240; I.V.:632.6] Out: 550 [Urine:550]   LABORATORY DATA: Recent Labs    02/22/24 1544 02/22/24 1818 02/22/24 2101  GLUCAP 88 111* 216*    Examination: Neurologically intact ABD soft Neurovascular intact Sensation intact distally Intact pulses distally Dorsiflexion/Plantar flexion intact Incision: dressing C/D/I No cellulitis present Compartment soft} XR AP&Lat of hip shows well placed\fixed THA  Assessment:   1 Day Post-Op Procedure(s) (LRB): RIGHT TOTAL HIP ARTHROPLASTY ANTERIOR APPROACH (Right) ADDITIONAL DIAGNOSIS: CAD, Emphysema  Patient's anticipated LOS is less than 2 midnights, meeting these requirements: - Younger than 39 - Lives within 1 hour of care - Has a competent adult at home to recover with post-op recover - NO history of  - Chronic pain requiring opiods  - Diabetes  - Coronary Artery Disease  - Heart failure  - Heart attack  - Stroke  - DVT/VTE  - Cardiac arrhythmia  - Respiratory Failure/COPD  -  Renal failure  - Anemia  - Advanced Liver disease     Plan: PT/OT WBAT, THA  DVT Prophylaxis: SCDx72 hrs, ASA 81 mg BID x 2 weeks  DISCHARGE PLAN: Home, today if passes PT  DISCHARGE NEEDS: HHPT, Walker, and 3-in-1 comode seatPatient ID: [name], female   DOB: 1941/02/04, 83 y.o.   MRN: [id number]

## 2024-02-24 DIAGNOSIS — I10 Essential (primary) hypertension: Secondary | ICD-10-CM | POA: Diagnosis not present

## 2024-02-24 DIAGNOSIS — Z7901 Long term (current) use of anticoagulants: Secondary | ICD-10-CM | POA: Diagnosis not present

## 2024-02-24 DIAGNOSIS — Z96641 Presence of right artificial hip joint: Secondary | ICD-10-CM | POA: Diagnosis not present

## 2024-02-24 DIAGNOSIS — E119 Type 2 diabetes mellitus without complications: Secondary | ICD-10-CM | POA: Diagnosis not present

## 2024-02-24 DIAGNOSIS — J449 Chronic obstructive pulmonary disease, unspecified: Secondary | ICD-10-CM | POA: Diagnosis not present

## 2024-02-24 DIAGNOSIS — Z7985 Long-term (current) use of injectable non-insulin antidiabetic drugs: Secondary | ICD-10-CM | POA: Diagnosis not present

## 2024-02-24 DIAGNOSIS — F17210 Nicotine dependence, cigarettes, uncomplicated: Secondary | ICD-10-CM | POA: Diagnosis not present

## 2024-02-24 DIAGNOSIS — I2510 Atherosclerotic heart disease of native coronary artery without angina pectoris: Secondary | ICD-10-CM | POA: Diagnosis not present

## 2024-02-24 DIAGNOSIS — Z471 Aftercare following joint replacement surgery: Secondary | ICD-10-CM | POA: Diagnosis not present

## 2024-02-24 DIAGNOSIS — Z556 Problems related to health literacy: Secondary | ICD-10-CM | POA: Diagnosis not present

## 2024-02-24 DIAGNOSIS — E785 Hyperlipidemia, unspecified: Secondary | ICD-10-CM | POA: Diagnosis not present

## 2024-02-24 DIAGNOSIS — I7121 Aneurysm of the ascending aorta, without rupture: Secondary | ICD-10-CM | POA: Diagnosis not present

## 2024-02-24 DIAGNOSIS — K5090 Crohn's disease, unspecified, without complications: Secondary | ICD-10-CM | POA: Diagnosis not present

## 2024-02-24 DIAGNOSIS — Z8551 Personal history of malignant neoplasm of bladder: Secondary | ICD-10-CM | POA: Diagnosis not present

## 2024-02-29 DIAGNOSIS — E119 Type 2 diabetes mellitus without complications: Secondary | ICD-10-CM | POA: Diagnosis not present

## 2024-02-29 DIAGNOSIS — Z471 Aftercare following joint replacement surgery: Secondary | ICD-10-CM | POA: Diagnosis not present

## 2024-02-29 DIAGNOSIS — I10 Essential (primary) hypertension: Secondary | ICD-10-CM | POA: Diagnosis not present

## 2024-02-29 DIAGNOSIS — I7121 Aneurysm of the ascending aorta, without rupture: Secondary | ICD-10-CM | POA: Diagnosis not present

## 2024-02-29 DIAGNOSIS — K5090 Crohn's disease, unspecified, without complications: Secondary | ICD-10-CM | POA: Diagnosis not present

## 2024-02-29 DIAGNOSIS — J449 Chronic obstructive pulmonary disease, unspecified: Secondary | ICD-10-CM | POA: Diagnosis not present

## 2024-03-02 DIAGNOSIS — K5090 Crohn's disease, unspecified, without complications: Secondary | ICD-10-CM | POA: Diagnosis not present

## 2024-03-02 DIAGNOSIS — I10 Essential (primary) hypertension: Secondary | ICD-10-CM | POA: Diagnosis not present

## 2024-03-02 DIAGNOSIS — I7121 Aneurysm of the ascending aorta, without rupture: Secondary | ICD-10-CM | POA: Diagnosis not present

## 2024-03-02 DIAGNOSIS — E119 Type 2 diabetes mellitus without complications: Secondary | ICD-10-CM | POA: Diagnosis not present

## 2024-03-02 DIAGNOSIS — Z471 Aftercare following joint replacement surgery: Secondary | ICD-10-CM | POA: Diagnosis not present

## 2024-03-02 DIAGNOSIS — J449 Chronic obstructive pulmonary disease, unspecified: Secondary | ICD-10-CM | POA: Diagnosis not present

## 2024-03-03 DIAGNOSIS — M1611 Unilateral primary osteoarthritis, right hip: Secondary | ICD-10-CM | POA: Diagnosis not present

## 2024-03-03 DIAGNOSIS — M25651 Stiffness of right hip, not elsewhere classified: Secondary | ICD-10-CM | POA: Diagnosis not present

## 2024-03-03 DIAGNOSIS — Z96641 Presence of right artificial hip joint: Secondary | ICD-10-CM | POA: Diagnosis not present

## 2024-03-07 DIAGNOSIS — M25651 Stiffness of right hip, not elsewhere classified: Secondary | ICD-10-CM | POA: Diagnosis not present

## 2024-03-07 DIAGNOSIS — Z96641 Presence of right artificial hip joint: Secondary | ICD-10-CM | POA: Diagnosis not present

## 2024-03-09 DIAGNOSIS — Z96641 Presence of right artificial hip joint: Secondary | ICD-10-CM | POA: Diagnosis not present

## 2024-03-09 DIAGNOSIS — M25651 Stiffness of right hip, not elsewhere classified: Secondary | ICD-10-CM | POA: Diagnosis not present

## 2024-03-14 DIAGNOSIS — M25651 Stiffness of right hip, not elsewhere classified: Secondary | ICD-10-CM | POA: Diagnosis not present

## 2024-03-14 DIAGNOSIS — Z96641 Presence of right artificial hip joint: Secondary | ICD-10-CM | POA: Diagnosis not present

## 2024-03-16 DIAGNOSIS — M25651 Stiffness of right hip, not elsewhere classified: Secondary | ICD-10-CM | POA: Diagnosis not present

## 2024-03-16 DIAGNOSIS — Z96641 Presence of right artificial hip joint: Secondary | ICD-10-CM | POA: Diagnosis not present

## 2024-03-22 DIAGNOSIS — M25651 Stiffness of right hip, not elsewhere classified: Secondary | ICD-10-CM | POA: Diagnosis not present

## 2024-03-22 DIAGNOSIS — Z96641 Presence of right artificial hip joint: Secondary | ICD-10-CM | POA: Diagnosis not present

## 2024-03-24 DIAGNOSIS — Z96641 Presence of right artificial hip joint: Secondary | ICD-10-CM | POA: Diagnosis not present

## 2024-03-24 DIAGNOSIS — M25651 Stiffness of right hip, not elsewhere classified: Secondary | ICD-10-CM | POA: Diagnosis not present

## 2024-03-29 DIAGNOSIS — M25651 Stiffness of right hip, not elsewhere classified: Secondary | ICD-10-CM | POA: Diagnosis not present

## 2024-03-29 DIAGNOSIS — Z96641 Presence of right artificial hip joint: Secondary | ICD-10-CM | POA: Diagnosis not present

## 2024-04-05 DIAGNOSIS — M25651 Stiffness of right hip, not elsewhere classified: Secondary | ICD-10-CM | POA: Diagnosis not present

## 2024-04-05 DIAGNOSIS — Z96641 Presence of right artificial hip joint: Secondary | ICD-10-CM | POA: Diagnosis not present

## 2024-04-08 DIAGNOSIS — M25651 Stiffness of right hip, not elsewhere classified: Secondary | ICD-10-CM | POA: Diagnosis not present

## 2024-04-08 DIAGNOSIS — Z96641 Presence of right artificial hip joint: Secondary | ICD-10-CM | POA: Diagnosis not present

## 2024-04-11 DIAGNOSIS — Z96641 Presence of right artificial hip joint: Secondary | ICD-10-CM | POA: Diagnosis not present

## 2024-04-11 DIAGNOSIS — M25651 Stiffness of right hip, not elsewhere classified: Secondary | ICD-10-CM | POA: Diagnosis not present

## 2024-04-14 DIAGNOSIS — Z96641 Presence of right artificial hip joint: Secondary | ICD-10-CM | POA: Diagnosis not present

## 2024-04-14 DIAGNOSIS — M25651 Stiffness of right hip, not elsewhere classified: Secondary | ICD-10-CM | POA: Diagnosis not present

## 2024-04-19 DIAGNOSIS — M25651 Stiffness of right hip, not elsewhere classified: Secondary | ICD-10-CM | POA: Diagnosis not present

## 2024-04-19 DIAGNOSIS — Z96641 Presence of right artificial hip joint: Secondary | ICD-10-CM | POA: Diagnosis not present

## 2024-04-21 DIAGNOSIS — Z96641 Presence of right artificial hip joint: Secondary | ICD-10-CM | POA: Diagnosis not present

## 2024-04-21 DIAGNOSIS — M25561 Pain in right knee: Secondary | ICD-10-CM | POA: Diagnosis not present

## 2024-04-26 DIAGNOSIS — E1151 Type 2 diabetes mellitus with diabetic peripheral angiopathy without gangrene: Secondary | ICD-10-CM | POA: Diagnosis not present

## 2024-04-26 DIAGNOSIS — M792 Neuralgia and neuritis, unspecified: Secondary | ICD-10-CM | POA: Diagnosis not present

## 2024-04-26 DIAGNOSIS — M19072 Primary osteoarthritis, left ankle and foot: Secondary | ICD-10-CM | POA: Diagnosis not present

## 2024-04-26 DIAGNOSIS — L97522 Non-pressure chronic ulcer of other part of left foot with fat layer exposed: Secondary | ICD-10-CM | POA: Diagnosis not present

## 2024-04-26 DIAGNOSIS — M19071 Primary osteoarthritis, right ankle and foot: Secondary | ICD-10-CM | POA: Diagnosis not present

## 2024-04-26 DIAGNOSIS — L089 Local infection of the skin and subcutaneous tissue, unspecified: Secondary | ICD-10-CM | POA: Diagnosis not present

## 2024-04-26 DIAGNOSIS — B353 Tinea pedis: Secondary | ICD-10-CM | POA: Diagnosis not present

## 2024-04-26 DIAGNOSIS — B351 Tinea unguium: Secondary | ICD-10-CM | POA: Diagnosis not present

## 2024-04-26 DIAGNOSIS — L84 Corns and callosities: Secondary | ICD-10-CM | POA: Diagnosis not present

## 2024-05-25 DIAGNOSIS — M792 Neuralgia and neuritis, unspecified: Secondary | ICD-10-CM | POA: Diagnosis not present

## 2024-05-25 DIAGNOSIS — B353 Tinea pedis: Secondary | ICD-10-CM | POA: Diagnosis not present

## 2024-05-25 DIAGNOSIS — M19071 Primary osteoarthritis, right ankle and foot: Secondary | ICD-10-CM | POA: Diagnosis not present

## 2024-05-25 DIAGNOSIS — L84 Corns and callosities: Secondary | ICD-10-CM | POA: Diagnosis not present

## 2024-05-25 DIAGNOSIS — L97522 Non-pressure chronic ulcer of other part of left foot with fat layer exposed: Secondary | ICD-10-CM | POA: Diagnosis not present

## 2024-05-25 DIAGNOSIS — B351 Tinea unguium: Secondary | ICD-10-CM | POA: Diagnosis not present

## 2024-05-25 DIAGNOSIS — L089 Local infection of the skin and subcutaneous tissue, unspecified: Secondary | ICD-10-CM | POA: Diagnosis not present

## 2024-05-25 DIAGNOSIS — E1151 Type 2 diabetes mellitus with diabetic peripheral angiopathy without gangrene: Secondary | ICD-10-CM | POA: Diagnosis not present

## 2024-05-25 DIAGNOSIS — M19072 Primary osteoarthritis, left ankle and foot: Secondary | ICD-10-CM | POA: Diagnosis not present

## 2024-05-27 DIAGNOSIS — M533 Sacrococcygeal disorders, not elsewhere classified: Secondary | ICD-10-CM | POA: Diagnosis not present

## 2024-06-06 DIAGNOSIS — M792 Neuralgia and neuritis, unspecified: Secondary | ICD-10-CM | POA: Diagnosis not present

## 2024-06-06 DIAGNOSIS — M19072 Primary osteoarthritis, left ankle and foot: Secondary | ICD-10-CM | POA: Diagnosis not present

## 2024-06-06 DIAGNOSIS — L089 Local infection of the skin and subcutaneous tissue, unspecified: Secondary | ICD-10-CM | POA: Diagnosis not present

## 2024-06-06 DIAGNOSIS — B353 Tinea pedis: Secondary | ICD-10-CM | POA: Diagnosis not present

## 2024-06-06 DIAGNOSIS — L97522 Non-pressure chronic ulcer of other part of left foot with fat layer exposed: Secondary | ICD-10-CM | POA: Diagnosis not present

## 2024-06-06 DIAGNOSIS — E1151 Type 2 diabetes mellitus with diabetic peripheral angiopathy without gangrene: Secondary | ICD-10-CM | POA: Diagnosis not present

## 2024-06-06 DIAGNOSIS — L84 Corns and callosities: Secondary | ICD-10-CM | POA: Diagnosis not present

## 2024-06-06 DIAGNOSIS — B351 Tinea unguium: Secondary | ICD-10-CM | POA: Diagnosis not present

## 2024-06-06 DIAGNOSIS — M19071 Primary osteoarthritis, right ankle and foot: Secondary | ICD-10-CM | POA: Diagnosis not present

## 2024-06-10 ENCOUNTER — Other Ambulatory Visit: Payer: Self-pay | Admitting: Family Medicine

## 2024-06-10 DIAGNOSIS — Z8551 Personal history of malignant neoplasm of bladder: Secondary | ICD-10-CM | POA: Diagnosis not present

## 2024-06-10 DIAGNOSIS — R140 Abdominal distension (gaseous): Secondary | ICD-10-CM | POA: Diagnosis not present

## 2024-06-10 DIAGNOSIS — Z8719 Personal history of other diseases of the digestive system: Secondary | ICD-10-CM | POA: Diagnosis not present

## 2024-06-10 DIAGNOSIS — N183 Chronic kidney disease, stage 3 unspecified: Secondary | ICD-10-CM | POA: Diagnosis not present

## 2024-06-10 DIAGNOSIS — K760 Fatty (change of) liver, not elsewhere classified: Secondary | ICD-10-CM | POA: Diagnosis not present

## 2024-06-13 DIAGNOSIS — E1151 Type 2 diabetes mellitus with diabetic peripheral angiopathy without gangrene: Secondary | ICD-10-CM | POA: Diagnosis not present

## 2024-06-13 DIAGNOSIS — B351 Tinea unguium: Secondary | ICD-10-CM | POA: Diagnosis not present

## 2024-06-13 DIAGNOSIS — M792 Neuralgia and neuritis, unspecified: Secondary | ICD-10-CM | POA: Diagnosis not present

## 2024-06-13 DIAGNOSIS — M19072 Primary osteoarthritis, left ankle and foot: Secondary | ICD-10-CM | POA: Diagnosis not present

## 2024-06-13 DIAGNOSIS — L089 Local infection of the skin and subcutaneous tissue, unspecified: Secondary | ICD-10-CM | POA: Diagnosis not present

## 2024-06-13 DIAGNOSIS — M19071 Primary osteoarthritis, right ankle and foot: Secondary | ICD-10-CM | POA: Diagnosis not present

## 2024-06-13 DIAGNOSIS — B353 Tinea pedis: Secondary | ICD-10-CM | POA: Diagnosis not present

## 2024-06-13 DIAGNOSIS — L97522 Non-pressure chronic ulcer of other part of left foot with fat layer exposed: Secondary | ICD-10-CM | POA: Diagnosis not present

## 2024-06-13 DIAGNOSIS — L84 Corns and callosities: Secondary | ICD-10-CM | POA: Diagnosis not present

## 2024-06-14 ENCOUNTER — Ambulatory Visit
Admission: RE | Admit: 2024-06-14 | Discharge: 2024-06-14 | Disposition: A | Discharge status: Home / Self Care | Source: Ambulatory Visit | Attending: Family Medicine | Admitting: Family Medicine

## 2024-06-14 DIAGNOSIS — K838 Other specified diseases of biliary tract: Secondary | ICD-10-CM | POA: Diagnosis not present

## 2024-06-14 DIAGNOSIS — N281 Cyst of kidney, acquired: Secondary | ICD-10-CM | POA: Diagnosis not present

## 2024-06-14 DIAGNOSIS — I700 Atherosclerosis of aorta: Secondary | ICD-10-CM | POA: Diagnosis not present

## 2024-06-14 DIAGNOSIS — K6389 Other specified diseases of intestine: Secondary | ICD-10-CM | POA: Diagnosis not present

## 2024-06-14 DIAGNOSIS — R140 Abdominal distension (gaseous): Secondary | ICD-10-CM

## 2024-06-14 MED ORDER — IOPAMIDOL (ISOVUE-300) INJECTION 61%
100.0000 mL | Freq: Once | INTRAVENOUS | Status: AC | PRN
  Administered 2024-06-14: 100 mL via INTRAVENOUS

## 2024-06-15 DIAGNOSIS — M533 Sacrococcygeal disorders, not elsewhere classified: Secondary | ICD-10-CM | POA: Diagnosis not present

## 2024-06-20 DIAGNOSIS — L97522 Non-pressure chronic ulcer of other part of left foot with fat layer exposed: Secondary | ICD-10-CM | POA: Diagnosis not present

## 2024-06-20 DIAGNOSIS — B351 Tinea unguium: Secondary | ICD-10-CM | POA: Diagnosis not present

## 2024-06-20 DIAGNOSIS — M19072 Primary osteoarthritis, left ankle and foot: Secondary | ICD-10-CM | POA: Diagnosis not present

## 2024-06-20 DIAGNOSIS — M19071 Primary osteoarthritis, right ankle and foot: Secondary | ICD-10-CM | POA: Diagnosis not present

## 2024-06-20 DIAGNOSIS — E1151 Type 2 diabetes mellitus with diabetic peripheral angiopathy without gangrene: Secondary | ICD-10-CM | POA: Diagnosis not present

## 2024-06-20 DIAGNOSIS — L089 Local infection of the skin and subcutaneous tissue, unspecified: Secondary | ICD-10-CM | POA: Diagnosis not present

## 2024-06-20 DIAGNOSIS — L03116 Cellulitis of left lower limb: Secondary | ICD-10-CM | POA: Diagnosis not present

## 2024-06-20 DIAGNOSIS — L84 Corns and callosities: Secondary | ICD-10-CM | POA: Diagnosis not present

## 2024-06-23 DIAGNOSIS — L089 Local infection of the skin and subcutaneous tissue, unspecified: Secondary | ICD-10-CM | POA: Diagnosis not present

## 2024-06-23 DIAGNOSIS — L97522 Non-pressure chronic ulcer of other part of left foot with fat layer exposed: Secondary | ICD-10-CM | POA: Diagnosis not present

## 2024-06-23 DIAGNOSIS — E1151 Type 2 diabetes mellitus with diabetic peripheral angiopathy without gangrene: Secondary | ICD-10-CM | POA: Diagnosis not present

## 2024-06-27 DIAGNOSIS — N183 Chronic kidney disease, stage 3 unspecified: Secondary | ICD-10-CM | POA: Diagnosis not present

## 2024-06-27 DIAGNOSIS — R1319 Other dysphagia: Secondary | ICD-10-CM | POA: Diagnosis not present

## 2024-06-27 DIAGNOSIS — I700 Atherosclerosis of aorta: Secondary | ICD-10-CM | POA: Diagnosis not present

## 2024-06-27 DIAGNOSIS — I129 Hypertensive chronic kidney disease with stage 1 through stage 4 chronic kidney disease, or unspecified chronic kidney disease: Secondary | ICD-10-CM | POA: Diagnosis not present

## 2024-06-27 DIAGNOSIS — I2510 Atherosclerotic heart disease of native coronary artery without angina pectoris: Secondary | ICD-10-CM | POA: Diagnosis not present

## 2024-06-27 DIAGNOSIS — E1140 Type 2 diabetes mellitus with diabetic neuropathy, unspecified: Secondary | ICD-10-CM | POA: Diagnosis not present

## 2024-06-27 DIAGNOSIS — E785 Hyperlipidemia, unspecified: Secondary | ICD-10-CM | POA: Diagnosis not present

## 2024-06-27 DIAGNOSIS — Z86718 Personal history of other venous thrombosis and embolism: Secondary | ICD-10-CM | POA: Diagnosis not present

## 2024-06-30 DIAGNOSIS — E1151 Type 2 diabetes mellitus with diabetic peripheral angiopathy without gangrene: Secondary | ICD-10-CM | POA: Diagnosis not present

## 2024-06-30 DIAGNOSIS — L97522 Non-pressure chronic ulcer of other part of left foot with fat layer exposed: Secondary | ICD-10-CM | POA: Diagnosis not present

## 2024-07-04 DIAGNOSIS — H02403 Unspecified ptosis of bilateral eyelids: Secondary | ICD-10-CM | POA: Diagnosis not present

## 2024-07-04 DIAGNOSIS — I10 Essential (primary) hypertension: Secondary | ICD-10-CM | POA: Diagnosis not present

## 2024-07-04 DIAGNOSIS — H401131 Primary open-angle glaucoma, bilateral, mild stage: Secondary | ICD-10-CM | POA: Diagnosis not present

## 2024-07-04 DIAGNOSIS — H04123 Dry eye syndrome of bilateral lacrimal glands: Secondary | ICD-10-CM | POA: Diagnosis not present

## 2024-07-07 DIAGNOSIS — L97522 Non-pressure chronic ulcer of other part of left foot with fat layer exposed: Secondary | ICD-10-CM | POA: Diagnosis not present

## 2024-07-07 DIAGNOSIS — E1151 Type 2 diabetes mellitus with diabetic peripheral angiopathy without gangrene: Secondary | ICD-10-CM | POA: Diagnosis not present

## 2024-07-08 ENCOUNTER — Other Ambulatory Visit: Payer: Self-pay | Admitting: *Deleted

## 2024-07-08 DIAGNOSIS — M533 Sacrococcygeal disorders, not elsewhere classified: Secondary | ICD-10-CM | POA: Diagnosis not present

## 2024-07-08 DIAGNOSIS — I82412 Acute embolism and thrombosis of left femoral vein: Secondary | ICD-10-CM

## 2024-07-08 DIAGNOSIS — M47816 Spondylosis without myelopathy or radiculopathy, lumbar region: Secondary | ICD-10-CM | POA: Diagnosis not present

## 2024-07-09 NOTE — Progress Notes (Unsigned)
 [facility] OFFICE PROGRESS NOTE  Patient Care Team: Teresa Channel, MD as PCP - General (Family Medicine) Raford Riggs, MD as PCP - Cardiology (Cardiology)  83 y.o.female referred to [facility] for history of deep vein thrombosis (DVT) of femoral vein of left lower extremity. [name] has history of hypertension, spinal stenosis, CAD, tobacco abuse, ascending aorta aneurysm, emphysema.   Last surgery was this year with R total hip arthroplasty.  First episode: 07/2023 Acute deep vein thrombosis (DVT) of femoral vein of left lower extremity  Setting: on aspirin  162 mg while developed dvt. Vaping.  Risk factor: Crohn's disease, personal history, DM. Treatment: apixaban  twice daily  No more vaping.   Recently she had right total hip arthroplasty in March 2025.  Overall recovering well.  Tolerating apixaban .    She had more bruising from iv. Discussed recent benefit of anticoagulation. After discussion she is comfortable on apixaban  2.5 mg twice daily. Assessment & Plan History of DVT (deep vein thrombosis) Developed while on aspirin  and was vaping. History of Crohn's disease, DM Will continue at prophylactic doses of apixaban . 2.5 mg twice daily Ok to hold 2 days before procedures. Polycythemia With personal history of emphysema. Appears stable.  New complaint regards, will obtain additional workup  Orders Placed This Encounter  Procedures   CBC with Differential (Cancer Center Only)    Standing Status:   Future    Expiration Date:   07/11/2025   Protime-INR    Standing Status:   Future    Expiration Date:   07/11/2025     Ana JAYSON Chihuahua, MD  INTERVAL HISTORY: Patient returns for follow-up.  Recently she had right total hip arthroplasty. She held for 2 days before and 3 days after. She is now on apixaban  5 mg twice daily. No bleeding  VTE history   She woke up and noted left leg was 3x size of normal.  Red, pain. No chest  pain, or difficulty breathing.   07/28/23 VAS US  LOWER EXTREMITY VENOUS ( DVT) LEFT: - Findings consistent with acute deep vein thrombosis involving the left common femoral vein, SF junction, left femoral vein, left proximal profunda vein, left popliteal vein, left posterior tibial veins, left peroneal veins, and left gastrocnemius veins.   Attempted visualization of the IVC and iliac veins. IVC prox appears patent. Right external iliac vein appears patent. Unable to visualize the bilateral common iliac veins due to bowel gas. Unable to detect flow in the left external iliac vein, suspected thrombosis in this area.   Eliquis  samples given per report.   08/05/23 she went to an urgent care and was sent to ED due to the degree of left leg swelling. She denies any chest pain, shortness of breath  CTV AP and LE.  1. Occlusive thrombus in the left common, external, and internal iliac veins extending to the junction with the IVC. 2. Nonocclusive thrombus in the left common and superficial femoral veins. 3. Diffuse soft tissue swelling of the left leg. 4. Partially visualized micro nodularity and ground-glass opacity in left lower lobe as well as in the right middle lobe are likely infectious/inflammatory. Follow-up in 4-6 weeks is recommended to ensure resolution.   08/11/23 Mechanical venous thrombectomy [facility]): Left popliteal, femoral, common femoral, external iliac, and common iliac vein. Venoplasty.    08/27/23 LE US  RIGHT: - No evidence of common femoral vein obstruction.   LEFT: - No evidence of deep vein thrombosis in the lower extremity. No indirect  evidence of obstruction proximal to the inguinal ligament.   - There is no evidence of superficial venous thrombosis.   08/27/23 VAS US  IVC/ ILIAC ( VENOUS ONLY) IVC/ Iliac: There is no evidence of thrombus involving the IVC. There is no evidence of thrombus involving the left common iliac vein. There is no evidence of thrombus involving the left  external iliac vein.   She was supposed to have left hip replacement but postpone due to DVT.   She denies DVT from previous foot and knee surgery. She has been taking 2 baby aspirin  for the last six months and now off since started apixaban .    She was vaping when DVT occurred.   2 pregnancies without blood clot.    No family history of blood clot.   No bloody stool or urine on apixaban .    Report history of scoliosis. She also has knee and foot arthritis.  PHYSICAL EXAMINATION:  Vitals:   07/11/24 0923  BP: 119/69  Pulse: 74  Resp: 18  Temp: (!) 97.3 F (36.3 C)  SpO2: 94%   Filed Weights   07/11/24 0923  Weight: 162 lb 8 oz (73.7 kg)   No chest pain on deep breaths Leg without tenseness, edema  Relevant data reviewed during this visit included labs.

## 2024-07-11 ENCOUNTER — Inpatient Hospital Stay (HOSPITAL_BASED_OUTPATIENT_CLINIC_OR_DEPARTMENT_OTHER)

## 2024-07-11 ENCOUNTER — Inpatient Hospital Stay

## 2024-07-11 VITALS — BP 119/69 | HR 74 | Temp 97.3°F | Resp 18 | Ht 63.0 in | Wt 162.5 lb

## 2024-07-11 DIAGNOSIS — M7989 Other specified soft tissue disorders: Secondary | ICD-10-CM | POA: Insufficient documentation

## 2024-07-11 DIAGNOSIS — R143 Flatulence: Secondary | ICD-10-CM | POA: Diagnosis not present

## 2024-07-11 DIAGNOSIS — E119 Type 2 diabetes mellitus without complications: Secondary | ICD-10-CM | POA: Diagnosis not present

## 2024-07-11 DIAGNOSIS — D751 Secondary polycythemia: Secondary | ICD-10-CM | POA: Diagnosis not present

## 2024-07-11 DIAGNOSIS — Z96641 Presence of right artificial hip joint: Secondary | ICD-10-CM | POA: Diagnosis not present

## 2024-07-11 DIAGNOSIS — E1151 Type 2 diabetes mellitus with diabetic peripheral angiopathy without gangrene: Secondary | ICD-10-CM | POA: Diagnosis not present

## 2024-07-11 DIAGNOSIS — Z79899 Other long term (current) drug therapy: Secondary | ICD-10-CM | POA: Diagnosis not present

## 2024-07-11 DIAGNOSIS — J439 Emphysema, unspecified: Secondary | ICD-10-CM | POA: Insufficient documentation

## 2024-07-11 DIAGNOSIS — Z86718 Personal history of other venous thrombosis and embolism: Secondary | ICD-10-CM | POA: Diagnosis not present

## 2024-07-11 DIAGNOSIS — Z7901 Long term (current) use of anticoagulants: Secondary | ICD-10-CM | POA: Insufficient documentation

## 2024-07-11 DIAGNOSIS — I82412 Acute embolism and thrombosis of left femoral vein: Secondary | ICD-10-CM

## 2024-07-11 DIAGNOSIS — L97522 Non-pressure chronic ulcer of other part of left foot with fat layer exposed: Secondary | ICD-10-CM | POA: Diagnosis not present

## 2024-07-11 LAB — CBC WITH DIFFERENTIAL (CANCER CENTER ONLY)
Abs Immature Granulocytes: 0.02 K/uL (ref 0.00–0.07)
Basophils Absolute: 0 K/uL (ref 0.0–0.1)
Basophils Relative: 0 %
Eosinophils Absolute: 0.1 K/uL (ref 0.0–0.5)
Eosinophils Relative: 1 %
HCT: 48.9 % — ABNORMAL HIGH (ref 36.0–46.0)
Hemoglobin: 16.6 g/dL — ABNORMAL HIGH (ref 12.0–15.0)
Immature Granulocytes: 0 %
Lymphocytes Relative: 17 %
Lymphs Abs: 1.3 K/uL (ref 0.7–4.0)
MCH: 30 pg (ref 26.0–34.0)
MCHC: 33.9 g/dL (ref 30.0–36.0)
MCV: 88.4 fL (ref 80.0–100.0)
Monocytes Absolute: 0.7 K/uL (ref 0.1–1.0)
Monocytes Relative: 10 %
Neutro Abs: 5.5 K/uL (ref 1.7–7.7)
Neutrophils Relative %: 72 %
Platelet Count: 173 K/uL (ref 150–400)
RBC: 5.53 MIL/uL — ABNORMAL HIGH (ref 3.87–5.11)
RDW: 14.5 % (ref 11.5–15.5)
WBC Count: 7.7 K/uL (ref 4.0–10.5)
nRBC: 0 % (ref 0.0–0.2)

## 2024-07-11 LAB — CMP (CANCER CENTER ONLY)
ALT: 23 U/L (ref 0–44)
AST: 18 U/L (ref 15–41)
Albumin: 4 g/dL (ref 3.5–5.0)
Alkaline Phosphatase: 63 U/L (ref 38–126)
Anion gap: 10 (ref 5–15)
BUN: 33 mg/dL — ABNORMAL HIGH (ref 8–23)
CO2: 27 mmol/L (ref 22–32)
Calcium: 9.6 mg/dL (ref 8.9–10.3)
Chloride: 108 mmol/L (ref 98–111)
Creatinine: 1.16 mg/dL — ABNORMAL HIGH (ref 0.44–1.00)
GFR, Estimated: 47 mL/min — ABNORMAL LOW (ref >60)
Glucose, Bld: 156 mg/dL — ABNORMAL HIGH (ref 70–99)
Potassium: 4.5 mmol/L (ref 3.5–5.1)
Sodium: 145 mmol/L (ref 135–145)
Total Bilirubin: 0.7 mg/dL (ref 0.0–1.2)
Total Protein: 7.3 g/dL (ref 6.5–8.1)

## 2024-07-11 MED ORDER — APIXABAN 2.5 MG PO TABS: 2.5000 mg | ORAL_TABLET | Freq: Two times a day (BID) | ORAL | 3 refills | Status: AC

## 2024-07-11 NOTE — Assessment & Plan Note (Addendum)
 Developed while on aspirin  and was vaping. History of Crohn's disease, DM Will continue at prophylactic doses of apixaban . 2.5 mg twice daily Ok to hold 2 days before procedures.

## 2024-07-11 NOTE — Assessment & Plan Note (Addendum)
 With personal history of emphysema. Appears stable.  New complaint regards, will obtain additional workup

## 2024-07-18 DIAGNOSIS — L84 Corns and callosities: Secondary | ICD-10-CM | POA: Diagnosis not present

## 2024-07-18 DIAGNOSIS — Z872 Personal history of diseases of the skin and subcutaneous tissue: Secondary | ICD-10-CM | POA: Diagnosis not present

## 2024-07-18 DIAGNOSIS — B351 Tinea unguium: Secondary | ICD-10-CM | POA: Diagnosis not present

## 2024-07-18 DIAGNOSIS — L97522 Non-pressure chronic ulcer of other part of left foot with fat layer exposed: Secondary | ICD-10-CM | POA: Diagnosis not present

## 2024-07-18 DIAGNOSIS — E1151 Type 2 diabetes mellitus with diabetic peripheral angiopathy without gangrene: Secondary | ICD-10-CM | POA: Diagnosis not present

## 2024-07-20 DIAGNOSIS — M47816 Spondylosis without myelopathy or radiculopathy, lumbar region: Secondary | ICD-10-CM | POA: Diagnosis not present

## 2024-07-21 DIAGNOSIS — K219 Gastro-esophageal reflux disease without esophagitis: Secondary | ICD-10-CM | POA: Diagnosis not present

## 2024-07-21 DIAGNOSIS — R140 Abdominal distension (gaseous): Secondary | ICD-10-CM | POA: Diagnosis not present

## 2024-07-21 DIAGNOSIS — F458 Other somatoform disorders: Secondary | ICD-10-CM | POA: Diagnosis not present

## 2024-07-21 DIAGNOSIS — R1310 Dysphagia, unspecified: Secondary | ICD-10-CM | POA: Diagnosis not present

## 2024-07-26 DIAGNOSIS — B351 Tinea unguium: Secondary | ICD-10-CM | POA: Diagnosis not present

## 2024-07-26 DIAGNOSIS — L97522 Non-pressure chronic ulcer of other part of left foot with fat layer exposed: Secondary | ICD-10-CM | POA: Diagnosis not present

## 2024-07-26 DIAGNOSIS — L84 Corns and callosities: Secondary | ICD-10-CM | POA: Diagnosis not present

## 2024-07-26 DIAGNOSIS — Z872 Personal history of diseases of the skin and subcutaneous tissue: Secondary | ICD-10-CM | POA: Diagnosis not present

## 2024-07-26 DIAGNOSIS — E1151 Type 2 diabetes mellitus with diabetic peripheral angiopathy without gangrene: Secondary | ICD-10-CM | POA: Diagnosis not present

## 2024-07-28 ENCOUNTER — Other Ambulatory Visit: Payer: Self-pay

## 2024-07-28 DIAGNOSIS — R1311 Dysphagia, oral phase: Secondary | ICD-10-CM

## 2024-07-28 DIAGNOSIS — R09A2 Foreign body sensation, throat: Secondary | ICD-10-CM

## 2024-08-02 ENCOUNTER — Ambulatory Visit (INDEPENDENT_AMBULATORY_CARE_PROVIDER_SITE_OTHER): Admitting: Family

## 2024-08-02 ENCOUNTER — Encounter (HOSPITAL_BASED_OUTPATIENT_CLINIC_OR_DEPARTMENT_OTHER): Payer: Self-pay | Admitting: Family

## 2024-08-02 ENCOUNTER — Ambulatory Visit
Admission: RE | Admit: 2024-08-02 | Discharge: 2024-08-02 | Disposition: A | Discharge status: Home / Self Care | Source: Ambulatory Visit

## 2024-08-02 VITALS — BP 112/64 | HR 59 | Ht 63.5 in | Wt 167.0 lb

## 2024-08-02 DIAGNOSIS — R09A2 Foreign body sensation, throat: Secondary | ICD-10-CM

## 2024-08-02 DIAGNOSIS — I10 Essential (primary) hypertension: Secondary | ICD-10-CM

## 2024-08-02 DIAGNOSIS — I25118 Atherosclerotic heart disease of native coronary artery with other forms of angina pectoris: Secondary | ICD-10-CM

## 2024-08-02 DIAGNOSIS — E785 Hyperlipidemia, unspecified: Secondary | ICD-10-CM

## 2024-08-02 DIAGNOSIS — K224 Dyskinesia of esophagus: Secondary | ICD-10-CM | POA: Diagnosis not present

## 2024-08-02 DIAGNOSIS — K219 Gastro-esophageal reflux disease without esophagitis: Secondary | ICD-10-CM | POA: Diagnosis not present

## 2024-08-02 DIAGNOSIS — R1311 Dysphagia, oral phase: Secondary | ICD-10-CM

## 2024-08-02 NOTE — Progress Notes (Signed)
 Cardiology Office Note   Date:  08/02/2024  ID:  Nikolina Simerson, DOB 30-Jun-1941, MRN [id number] PCP: Teresa Channel, MD  [facility] Providers Cardiologist:  Annabella Scarce, MD     History of Present Illness  Discussed the use of AI scribe software for clinical note transcription with the patient, who gave verbal consent to proceed.  History of Present Illness [name] is an 83 year old female who presents with persistent back pain. Hx of coronary calcification on CT 10/2017, RBBB, ascending aorta aneurysm, HTN, DM2, COPD, Chron's bladder cancer, tobacco use, DVT.   Myoview  02/2018 low risk, no ischemia. Echo 2020 LVEF 60-65%. Echo 11/2021 LVEF 55-60%, no RWMA, mild LVH, AV sclerosis without stenosis, dilation ascending aorta 40mm.On 08/11/23 underwent mechanical venous thrombectomy with removal of DVT from common and external iliac vein with plan for Eliquis  x 3 months. No clear etiology for DVT by Dr. Serene, referred to hematology.  She denies chest pain, palpitations, or significant cardiac symptoms. Her blood pressure typically reads around 115/65. She experiences occasional lightheadedness, which she attributes to low blood pressure, dehydration, or missed meals. She is planning a 17-day trip to [place] and is concerned about the long flight duration. Had hip surgery in March and two injections in her back, most recent 2 weeks ago.    ROS: Please see the history of present illness.    All other systems reviewed and are negative.   Studies Reviewed      Cardiac Studies & Procedures   ______________________________________________________________________________________________   STRESS TESTS  MYOCARDIAL PERFUSION IMAGING 02/17/2018  Interpretation Summary  Nuclear stress EF: 58%.  The left ventricular ejection fraction is normal (55-65%).  Blood pressure demonstrated a normal response to exercise.  There was no ST segment deviation noted during  stress.  No T wave inversion was noted during stress.  This is a low risk study.  No reversible ischemia. LVEF 58% with normal wall motion. This is a low risk study.   ECHOCARDIOGRAM  ECHOCARDIOGRAM COMPLETE 11/16/2023  Narrative ECHOCARDIOGRAM REPORT    Patient Name:   [name] Date of Exam: 11/16/2023 Medical Rec #:  [id number]     Height:       63.6 in Accession #:    [id number]    Weight:       170.2 lb Date of Birth:  05/14/1941     BSA:          1.818 m Patient Age:    82 years      BP:           122/85 mmHg Patient Gender: F             HR:           78 bpm. Exam Location:  Outpatient  Procedure: 2D Echo, 3D Echo, Color Doppler, Cardiac Doppler and Strain Analysis  Indications:    Ascending aorta aneurysm  History:        Patient has prior history of Echocardiogram examinations, most recent 12/13/2021. CAD; Risk Factors:Hypertension and Former Smoker.  Sonographer:    Orvil Holmes RDCS Referring Phys: [id number] KATLYN D WEST  IMPRESSIONS   1. Left ventricular ejection fraction, by estimation, is 55 to 60%. The left ventricle has normal function. The left ventricle has no regional wall motion abnormalities. There is mild concentric left ventricular hypertrophy. Left ventricular diastolic parameters are consistent with Grade I diastolic dysfunction (impaired relaxation). 2. Right ventricular systolic function is normal. The right ventricular size is  normal. Tricuspid regurgitation signal is inadequate for assessing PA pressure. 3. The mitral valve is normal in structure. No evidence of mitral valve regurgitation. 4. The aortic valve is tricuspid. Aortic valve regurgitation is not visualized. 5. There is mild dilatation of the ascending aorta, measuring 40 mm. 6. The inferior vena cava is normal in size with greater than 50% respiratory variability, suggesting right atrial pressure of 3 mmHg.  Comparison(s): No significant change from prior study. Prior images  reviewed side by side.  FINDINGS Left Ventricle: Left ventricular ejection fraction, by estimation, is 55 to 60%. The left ventricle has normal function. The left ventricle has no regional wall motion abnormalities. Global longitudinal strain performed but not reported based on interpreter judgement due to suboptimal tracking. 3D ejection fraction reviewed and evaluated as part of the interpretation. Alternate measurement of EF is felt to be most reflective of LV function. The left ventricular internal cavity size was normal in size. There is mild concentric left ventricular hypertrophy. Left ventricular diastolic parameters are consistent with Grade I diastolic dysfunction (impaired relaxation).  Right Ventricle: The right ventricular size is normal. No increase in right ventricular wall thickness. Right ventricular systolic function is normal. Tricuspid regurgitation signal is inadequate for assessing PA pressure.  Left Atrium: Left atrial size was normal in size.  Right Atrium: Right atrial size was normal in size.  Pericardium: There is no evidence of pericardial effusion.  Mitral Valve: The mitral valve is normal in structure. Mild mitral annular calcification. No evidence of mitral valve regurgitation.  Tricuspid Valve: The tricuspid valve is normal in structure. Tricuspid valve regurgitation is not demonstrated.  Aortic Valve: The aortic valve is tricuspid. Aortic valve regurgitation is not visualized.  Pulmonic Valve: The pulmonic valve was not well visualized. Pulmonic valve regurgitation is trivial. No evidence of pulmonic stenosis.  Aorta: The aortic root is normal in size and structure. There is mild dilatation of the ascending aorta, measuring 40 mm.  Venous: The inferior vena cava is normal in size with greater than 50% respiratory variability, suggesting right atrial pressure of 3 mmHg.  IAS/Shunts: No atrial level shunt detected by color flow Doppler.   LEFT  VENTRICLE PLAX 2D LVIDd:         3.16 cm   Diastology LVIDs:         2.33 cm   LV e' medial:    4.13 cm/s LV PW:         1.29 cm   LV E/e' medial:  16.0 LV IVS:        1.20 cm   LV e' lateral:   5.66 cm/s LVOT diam:     2.00 cm   LV E/e' lateral: 11.7 LV SV:         50 LV SV Index:   27 LVOT Area:     3.14 cm  3D Volume EF: 3D EF:        49 % LV EDV:       82 ml LV ESV:       41 ml LV SV:        40 ml  RIGHT VENTRICLE RV Basal diam:  3.98 cm RV Mid diam:    3.56 cm RV S prime:     9.14 cm/s TAPSE (M-mode): 2.2 cm  LEFT ATRIUM             Index        RIGHT ATRIUM  Index LA diam:        3.20 cm 1.76 cm/m   RA Area:     12.90 cm LA Vol (A2C):   26.4 ml 14.52 ml/m  RA Volume:   28.90 ml  15.90 ml/m LA Vol (A4C):   17.2 ml 9.46 ml/m LA Biplane Vol: 21.9 ml 12.05 ml/m AORTIC VALVE LVOT Vmax:   78.60 cm/s LVOT Vmean:  51.900 cm/s LVOT VTI:    0.158 m  AORTA Ao Root diam: 2.70 cm Ao Asc diam:  4.00 cm  MITRAL VALVE MV Area (PHT): 3.99 cm     SHUNTS MV Decel Time: 190 msec     Systemic VTI:  0.16 m MV E velocity: 66.00 cm/s   Systemic Diam: 2.00 cm MV A velocity: 103.00 cm/s MV E/A ratio:  0.64  Mihai Croitoru MD Electronically signed by Jerel Balding MD Signature Date/Time: 11/16/2023/11:03:24 AM    Final          ______________________________________________________________________________________________      Risk Assessment/Calculations           Physical Exam VS:  BP 112/64   Pulse (!) 59   Ht 5' 3.5 (1.613 m)   Wt 167 lb (75.8 kg)   SpO2 97%   BMI 29.12 kg/m        Wt Readings from Last 3 Encounters:  08/02/24 167 lb (75.8 kg)  07/11/24 162 lb 8 oz (73.7 kg)  02/22/24 164 lb (74.4 kg)    GEN: Well nourished, well developed in no acute distress NECK: No JVD; No carotid bruits CARDIAC: RRR, no murmurs, rubs, gallops RESPIRATORY:  Clear to auscultation without rales, wheezing or rhonchi  ABDOMEN: Soft, non-tender,  non-distended EXTREMITIES:  No edema; No deformity   ASSESSMENT AND PLAN Assessment and Plan Assessment & Plan Chronic back pain Chronic back pain persists despite hip replacement and recent ineffective injections. Gabapentin 300 mg discussed with concerns about drowsiness. - Try gabapentin 300 mg at home when able to rest to assess tolerance and effectiveness. Has Rx from another provider.  - Use lidocaine  patches for targeted pain relief, especially during travel. - Consider using gabapentin during flight to manage pain and aid sleep.  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 Blood pressure controlled with home readings averaging 110-115/65. Occasional lightheadedness likely due to low blood pressure, dehydration, or missed meals. - Continue current blood pressure management. - Maintain hydration and regular meals, especially during travel. - Repeat ultrasound in 2026 to monitor aortic root dilation.  Nonobstructive CAD / HLD Stable with no anginal symptoms. No indication for ischemic evaluation.   -GDMT Atorvastatin  40mg  daily, no BB due to bradycardia, no ASA due to OAC. -Recommend aiming for 150 minutes of moderate intensity activity per week and following a heart healthy diet.           Dispo: follow up in 6 mos  Signed, Reche GORMAN Finder, NP

## 2024-08-02 NOTE — Patient Instructions (Signed)
 Medication Instructions:  Continue your current medications.  *If you need a refill on your cardiac medications before your next appointment, please call your pharmacy*  Follow-Up: At [facility], you and your health needs are our priority.  As part of our continuing mission to provide you with exceptional heart care, our providers are all part of one team.  This team includes your primary Cardiologist (physician) and Advanced Practice Providers or APPs (Physician Assistants and Nurse Practitioners) who all work together to provide you with the care you need, when you need it.  Your next appointment:   6 month(s)  Provider:   Annabella Scarce, MD, Rosaline Bane, NP, or Reche Finder, NP    We recommend signing up for the patient portal called MyChart.  Sign up information is provided on this After Visit Summary.  MyChart is used to connect with patients for Virtual Visits (Telemedicine).  Patients are able to view lab/test results, encounter notes, upcoming appointments, etc.  Non-urgent messages can be sent to your provider as well.   To learn more about what you can do with MyChart, go to [email or web address].   Other Instructions  Could try lidocaine  patch for back with travel

## 2024-08-03 DIAGNOSIS — L97522 Non-pressure chronic ulcer of other part of left foot with fat layer exposed: Secondary | ICD-10-CM | POA: Diagnosis not present

## 2024-08-03 DIAGNOSIS — Z872 Personal history of diseases of the skin and subcutaneous tissue: Secondary | ICD-10-CM | POA: Diagnosis not present

## 2024-08-03 DIAGNOSIS — L84 Corns and callosities: Secondary | ICD-10-CM | POA: Diagnosis not present

## 2024-08-03 DIAGNOSIS — E1151 Type 2 diabetes mellitus with diabetic peripheral angiopathy without gangrene: Secondary | ICD-10-CM | POA: Diagnosis not present

## 2024-08-03 DIAGNOSIS — B351 Tinea unguium: Secondary | ICD-10-CM | POA: Diagnosis not present

## 2024-08-12 DIAGNOSIS — B351 Tinea unguium: Secondary | ICD-10-CM | POA: Diagnosis not present

## 2024-08-12 DIAGNOSIS — E1151 Type 2 diabetes mellitus with diabetic peripheral angiopathy without gangrene: Secondary | ICD-10-CM | POA: Diagnosis not present

## 2024-08-12 DIAGNOSIS — Z872 Personal history of diseases of the skin and subcutaneous tissue: Secondary | ICD-10-CM | POA: Diagnosis not present

## 2024-08-12 DIAGNOSIS — L84 Corns and callosities: Secondary | ICD-10-CM | POA: Diagnosis not present

## 2024-08-12 DIAGNOSIS — L97522 Non-pressure chronic ulcer of other part of left foot with fat layer exposed: Secondary | ICD-10-CM | POA: Diagnosis not present

## 2024-08-19 ENCOUNTER — Other Ambulatory Visit: Payer: Medicare Other

## 2024-09-19 DIAGNOSIS — L97522 Non-pressure chronic ulcer of other part of left foot with fat layer exposed: Secondary | ICD-10-CM | POA: Diagnosis not present

## 2024-09-19 DIAGNOSIS — E1151 Type 2 diabetes mellitus with diabetic peripheral angiopathy without gangrene: Secondary | ICD-10-CM | POA: Diagnosis not present

## 2024-09-19 DIAGNOSIS — L84 Corns and callosities: Secondary | ICD-10-CM | POA: Diagnosis not present

## 2024-09-19 DIAGNOSIS — B351 Tinea unguium: Secondary | ICD-10-CM | POA: Diagnosis not present

## 2024-09-19 DIAGNOSIS — Z872 Personal history of diseases of the skin and subcutaneous tissue: Secondary | ICD-10-CM | POA: Diagnosis not present

## 2024-09-29 ENCOUNTER — Ambulatory Visit (HOSPITAL_BASED_OUTPATIENT_CLINIC_OR_DEPARTMENT_OTHER)
Admission: RE | Admit: 2024-09-29 | Discharge: 2024-09-29 | Disposition: A | Discharge status: Home / Self Care | Source: Ambulatory Visit | Attending: Family Medicine | Admitting: Family Medicine

## 2024-09-29 DIAGNOSIS — Z780 Asymptomatic menopausal state: Secondary | ICD-10-CM | POA: Diagnosis not present

## 2024-09-29 DIAGNOSIS — M810 Age-related osteoporosis without current pathological fracture: Secondary | ICD-10-CM | POA: Insufficient documentation

## 2024-10-17 DIAGNOSIS — L84 Corns and callosities: Secondary | ICD-10-CM | POA: Diagnosis not present

## 2024-10-17 DIAGNOSIS — E1151 Type 2 diabetes mellitus with diabetic peripheral angiopathy without gangrene: Secondary | ICD-10-CM | POA: Diagnosis not present

## 2024-10-17 DIAGNOSIS — L97522 Non-pressure chronic ulcer of other part of left foot with fat layer exposed: Secondary | ICD-10-CM | POA: Diagnosis not present

## 2024-10-17 DIAGNOSIS — B351 Tinea unguium: Secondary | ICD-10-CM | POA: Diagnosis not present

## 2024-10-17 DIAGNOSIS — Z872 Personal history of diseases of the skin and subcutaneous tissue: Secondary | ICD-10-CM | POA: Diagnosis not present

## 2024-10-26 DIAGNOSIS — E663 Overweight: Secondary | ICD-10-CM | POA: Diagnosis not present

## 2024-10-26 DIAGNOSIS — R21 Rash and other nonspecific skin eruption: Secondary | ICD-10-CM | POA: Diagnosis not present

## 2024-10-26 DIAGNOSIS — E1140 Type 2 diabetes mellitus with diabetic neuropathy, unspecified: Secondary | ICD-10-CM | POA: Diagnosis not present

## 2024-10-26 DIAGNOSIS — Z23 Encounter for immunization: Secondary | ICD-10-CM | POA: Diagnosis not present

## 2024-10-27 DIAGNOSIS — K638211 Small intestinal bacterial overgrowth, hydrogen-subtype: Secondary | ICD-10-CM | POA: Diagnosis not present

## 2024-10-27 DIAGNOSIS — R140 Abdominal distension (gaseous): Secondary | ICD-10-CM | POA: Diagnosis not present

## 2024-11-03 DIAGNOSIS — K638219 Small intestinal bacterial overgrowth, unspecified: Secondary | ICD-10-CM | POA: Diagnosis not present

## 2024-11-07 DIAGNOSIS — M47816 Spondylosis without myelopathy or radiculopathy, lumbar region: Secondary | ICD-10-CM | POA: Diagnosis not present

## 2024-11-14 DIAGNOSIS — C671 Malignant neoplasm of dome of bladder: Secondary | ICD-10-CM | POA: Diagnosis not present

## 2024-11-14 DIAGNOSIS — R3915 Urgency of urination: Secondary | ICD-10-CM | POA: Diagnosis not present

## 2024-11-15 ENCOUNTER — Encounter (HOSPITAL_BASED_OUTPATIENT_CLINIC_OR_DEPARTMENT_OTHER): Payer: Self-pay | Admitting: Emergency Medicine

## 2024-11-15 ENCOUNTER — Emergency Department (HOSPITAL_BASED_OUTPATIENT_CLINIC_OR_DEPARTMENT_OTHER)

## 2024-11-15 ENCOUNTER — Emergency Department (HOSPITAL_BASED_OUTPATIENT_CLINIC_OR_DEPARTMENT_OTHER)
Admission: EM | Admit: 2024-11-15 | Discharge: 2024-11-15 | Disposition: A | Discharge status: Home / Self Care | Source: Ambulatory Visit | Attending: Emergency Medicine | Admitting: Emergency Medicine

## 2024-11-15 ENCOUNTER — Other Ambulatory Visit: Payer: Self-pay

## 2024-11-15 DIAGNOSIS — I82409 Acute embolism and thrombosis of unspecified deep veins of unspecified lower extremity: Secondary | ICD-10-CM | POA: Insufficient documentation

## 2024-11-15 DIAGNOSIS — M1812 Unilateral primary osteoarthritis of first carpometacarpal joint, left hand: Secondary | ICD-10-CM | POA: Diagnosis not present

## 2024-11-15 DIAGNOSIS — Z7901 Long term (current) use of anticoagulants: Secondary | ICD-10-CM | POA: Insufficient documentation

## 2024-11-15 DIAGNOSIS — M109 Gout, unspecified: Secondary | ICD-10-CM | POA: Diagnosis not present

## 2024-11-15 DIAGNOSIS — Z79899 Other long term (current) drug therapy: Secondary | ICD-10-CM | POA: Diagnosis not present

## 2024-11-15 DIAGNOSIS — I10 Essential (primary) hypertension: Secondary | ICD-10-CM | POA: Insufficient documentation

## 2024-11-15 DIAGNOSIS — Z87891 Personal history of nicotine dependence: Secondary | ICD-10-CM | POA: Insufficient documentation

## 2024-11-15 DIAGNOSIS — M10032 Idiopathic gout, left wrist: Secondary | ICD-10-CM | POA: Diagnosis not present

## 2024-11-15 DIAGNOSIS — E119 Type 2 diabetes mellitus without complications: Secondary | ICD-10-CM | POA: Diagnosis not present

## 2024-11-15 DIAGNOSIS — M25532 Pain in left wrist: Secondary | ICD-10-CM | POA: Diagnosis not present

## 2024-11-15 DIAGNOSIS — Z8551 Personal history of malignant neoplasm of bladder: Secondary | ICD-10-CM | POA: Diagnosis not present

## 2024-11-15 DIAGNOSIS — M2550 Pain in unspecified joint: Secondary | ICD-10-CM | POA: Diagnosis not present

## 2024-11-15 DIAGNOSIS — M7989 Other specified soft tissue disorders: Secondary | ICD-10-CM | POA: Diagnosis not present

## 2024-11-15 DIAGNOSIS — J449 Chronic obstructive pulmonary disease, unspecified: Secondary | ICD-10-CM | POA: Diagnosis not present

## 2024-11-15 DIAGNOSIS — M79602 Pain in left arm: Secondary | ICD-10-CM | POA: Diagnosis not present

## 2024-11-15 LAB — CBC WITH DIFFERENTIAL/PLATELET
Abs Immature Granulocytes: 0.04 K/uL (ref 0.00–0.07)
Basophils Absolute: 0 K/uL (ref 0.0–0.1)
Basophils Relative: 0 %
Eosinophils Absolute: 0 K/uL (ref 0.0–0.5)
Eosinophils Relative: 0 %
HCT: 46.6 % — ABNORMAL HIGH (ref 36.0–46.0)
Hemoglobin: 15.9 g/dL — ABNORMAL HIGH (ref 12.0–15.0)
Immature Granulocytes: 0 %
Lymphocytes Relative: 13 %
Lymphs Abs: 1.7 K/uL (ref 0.7–4.0)
MCH: 31 pg (ref 26.0–34.0)
MCHC: 34.1 g/dL (ref 30.0–36.0)
MCV: 90.8 fL (ref 80.0–100.0)
Monocytes Absolute: 1.2 K/uL — ABNORMAL HIGH (ref 0.1–1.0)
Monocytes Relative: 9 %
Neutro Abs: 9.9 K/uL — ABNORMAL HIGH (ref 1.7–7.7)
Neutrophils Relative %: 78 %
Platelets: 170 K/uL (ref 150–400)
RBC: 5.13 MIL/uL — ABNORMAL HIGH (ref 3.87–5.11)
RDW: 13.6 % (ref 11.5–15.5)
WBC: 12.8 K/uL — ABNORMAL HIGH (ref 4.0–10.5)
nRBC: 0 % (ref 0.0–0.2)

## 2024-11-15 LAB — BASIC METABOLIC PANEL WITH GFR
Anion gap: 16 — ABNORMAL HIGH (ref 5–15)
BUN: 18 mg/dL (ref 8–23)
CO2: 21 mmol/L — ABNORMAL LOW (ref 22–32)
Calcium: 9.7 mg/dL (ref 8.9–10.3)
Chloride: 102 mmol/L (ref 98–111)
Creatinine, Ser: 0.91 mg/dL (ref 0.44–1.00)
GFR, Estimated: >60 mL/min (ref >60)
Glucose, Bld: 207 mg/dL — ABNORMAL HIGH (ref 70–99)
Potassium: 3.7 mmol/L (ref 3.5–5.1)
Sodium: 140 mmol/L (ref 135–145)

## 2024-11-15 LAB — URIC ACID: Uric Acid, Serum: 5 mg/dL (ref 2.5–7.1)

## 2024-11-15 MED ORDER — IBUPROFEN 800 MG PO TABS
800.0000 mg | ORAL_TABLET | Freq: Once | ORAL | Status: AC
  Administered 2024-11-15: 800 mg via ORAL
  Filled 2024-11-15: qty 1

## 2024-11-15 MED ORDER — ONDANSETRON HCL 4 MG PO TABS
4.0000 mg | ORAL_TABLET | Freq: Once | ORAL | Status: AC
  Administered 2024-11-15: 4 mg via ORAL
  Filled 2024-11-15: qty 1

## 2024-11-15 MED ORDER — COLCHICINE 0.6 MG PO TABS: 0.6000 mg | ORAL_TABLET | Freq: Two times a day (BID) | ORAL | 0 refills | Status: AC

## 2024-11-15 MED ORDER — ACETAMINOPHEN 500 MG PO TABS
1000.0000 mg | ORAL_TABLET | Freq: Once | ORAL | Status: DC
  Filled 2024-11-15: qty 2

## 2024-11-15 MED ORDER — COLCHICINE 0.6 MG PO TABS
0.6000 mg | ORAL_TABLET | Freq: Once | ORAL | Status: AC
  Administered 2024-11-15: 0.6 mg via ORAL
  Filled 2024-11-15: qty 1

## 2024-11-15 NOTE — ED Provider Notes (Signed)
 [place] EMERGENCY DEPARTMENT AT [facility] Provider Note  CSN: [id number] Arrival date & time: 11/15/24 1001  Chief Complaint(s) Arm Swelling  HPI [name] is a 83 y.o. female who is here today with pain in her left wrist.  She reports that the symptoms began yesterday.  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  Denies any fever, no trauma to the area.   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 Authorizing Provider  colchicine 0.6 MG tablet Take 1 tablet (0.6 mg total) by mouth 2 (two) times daily for 7 days. 11/15/24 11/22/24 Yes Mannie Pac T, DO  FREESTYLE LITE test strip SMARTSIG:Strip(s) 11/04/24  Yes [provider]  MYRBETRIQ 50 MG TB24 tablet Take 50 mg by mouth daily. 11/14/24  Yes [provider]   apixaban  (ELIQUIS ) 2.5 MG TABS tablet Take 1 tablet (2.5 mg total) by mouth 2 (two) times daily. 07/11/24   Tina Pauletta BROCKS, MD  atorvastatin  (LIPITOR) 40 MG tablet Take 40 mg by mouth daily.    [provider]  BIOTIN PO Take 1 tablet by mouth daily.    [provider]  calcium -vitamin D (OSCAL WITH D) 500-200 MG-UNIT tablet Take 1 tablet by mouth daily with breakfast.    [provider]  Cholecalciferol (VITAMIN D-3) 125 MCG (5000 UT) TABS Take 5,000 Units by mouth daily.    [provider]  Collagen-Vitamin C-Biotin (COLLAGEN PO) Take 1 tablet by mouth daily.    [provider]  Cyanocobalamin (VITAMIN B-12) 5000 MCG TBDP Take 5,000 mcg by mouth daily.    [provider]  empagliflozin  (JARDIANCE ) 25 MG TABS tablet Take 25 mg by mouth daily. 07/21/23   [provider]  latanoprost  (XALATAN ) 0.005 % ophthalmic solution Place 1 drop into both eyes at bedtime. 09/05/21   [provider]  lisinopril  (ZESTRIL ) 2.5 MG tablet Take 2.5 mg by mouth daily.    [provider]  Multiple Vitamins-Minerals (WOMENS MULTI VITAMIN & MINERAL) TABS Take 1 tablet by mouth daily.    [provider]  Omega-3 Fatty Acids (FISH OIL) 1000 MG CAPS Take 1,000 mg by mouth daily.    [provider]  omeprazole (PRILOSEC) 40 MG capsule 1 capsule  1/2 to 1 hour Orally twice a day 02/17/24   [provider]  ondansetron  (ZOFRAN -ODT) 4 MG disintegrating tablet Take 4 mg by mouth every 8 (eight) hours as needed for vomiting or nausea.    [provider]  oxybutynin  (DITROPAN -XL) 10 MG 24 hr tablet Take 10 mg by mouth daily.    [provider]  oxyCODONE -acetaminophen  (PERCOCET/ROXICET) 5-325 MG tablet Take 1 tablet by mouth every 4 (four) hours as needed for severe pain (pain score 7-10). 02/22/24   Orlando Camellia POUR, PA-C  OZEMPIC, 0.25 OR 0.5 MG/DOSE, 2 MG/3ML SOPN Inject 0.5 mg into the skin every Thursday.  11/24/23   [provider]  RESTASIS  0.05 % ophthalmic emulsion Place 1 drop into both eyes 2 (two) times daily. 09/22/21   [provider]  tiZANidine  (ZANAFLEX ) 2 MG tablet Take 1 tablet (2 mg total) by mouth every 6 (six) hours as needed for muscle spasms. 02/22/24   Orlando Camellia POUR, PA-C  traMADol  (ULTRAM ) 50 MG tablet Take 50 mg by mouth 4 (four) times daily as needed for severe pain (pain score 7-10).    [provider]  umeclidinium-vilanterol (ANORO ELLIPTA ) 62.5-25 MCG/INH AEPB Inhale 1 puff into the lungs daily.    [provider]                                                                                                                                    Past Surgical History Past Surgical History:  Procedure Laterality Date   BLADDER SURGERY     CHOLECYSTECTOMY     FOOT ARTHRODESIS Left 07/02/2017   Procedure: Left Subtalar Arthrodesis;  Surgeon: Kit Rush, MD;  Location: [facility];  Service: Orthopedics;  Laterality: Left;   FRACTURE SURGERY     HARDWARE REMOVAL Left 04/24/2016   Procedure: REMOVAL OF DEEP IMPLANT LEFT FOOT;  Surgeon: Rush Kit, MD;  Location: [facility];  Service: Orthopedics;  Laterality: Left;  ANESTHESIA:  REGIONAL/MONITOR ANESTHESIA   HARDWARE REMOVAL Left 07/02/2017   Procedure: Removal of Deep Implants;  Surgeon: Kit Rush, MD;  Location:  SURGERY CENTER;  Service: Orthopedics;  Laterality: Left;   JOINT REPLACEMENT     knee   PERIPHERAL VASCULAR BALLOON ANGIOPLASTY  08/11/2023   Procedure: PERIPHERAL VASCULAR BALLOON ANGIOPLASTY;  Surgeon: Serene Gaile ORN, MD;  Location: [facility];  Service: Cardiovascular;;   PERIPHERAL VASCULAR THROMBECTOMY N/A 08/11/2023   Procedure: PERIPHERAL VASCULAR THROMBECTOMY VENOUS;  Surgeon: Serene Gaile ORN, MD;  Location: [facility];  Service: Cardiovascular;  Laterality: N/A;   throat polyps     TOTAL HIP ARTHROPLASTY Right  02/22/2024   Procedure: RIGHT TOTAL HIP ARTHROPLASTY ANTERIOR APPROACH;  Surgeon: Liam Lerner, MD;  Location: [facility];  Service: Orthopedics;  Laterality: Right;   TUBAL LIGATION     Family History Family History  Problem Relation Age of Onset  Cancer Mother    Stroke Sister     Social History Social History   Tobacco Use   Smoking status: Former    Current packs/day: 1.00    Average packs/day: 1 pack/day for 40.0 years (40.0 ttl pk-yrs)    Types: Cigarettes, E-cigarettes   Smokeless tobacco: Never   Tobacco comments:    currently trying to quit 06-26-17  Vaping Use   Vaping status: Every Day  Substance Use Topics   Alcohol use: No    Alcohol/week: 0.0 standard drinks of alcohol   Drug use: No   Allergies Crestor [rosuvastatin] and Metformin hcl er  Review of Systems Review of Systems  Physical Exam Vital Signs  I have reviewed the triage vital signs BP 138/61   Pulse 78   Temp 98.7 F (37.1 C) (Oral)   Resp 16   SpO2 91%   Physical Exam Vitals and nursing note reviewed.  Constitutional:      Appearance: She is not toxic-appearing.  HENT:     Head: Normocephalic and atraumatic.  Eyes:     Pupils: Pupils are equal, round, and reactive to light.  Cardiovascular:     Rate and Rhythm: Normal rate.  Pulmonary:     Effort: Pulmonary effort is normal.  Musculoskeletal:     Comments: Left wrist-erythematous, warm mildly swollen.  No deformity, no lesion, no rash.  Neurological:     General: No focal deficit present.     Mental Status: She is alert.     ED Results and Treatments Labs (all labs ordered are listed, but only abnormal results are displayed) Labs Reviewed  BASIC METABOLIC PANEL WITH GFR - Abnormal; Notable for the following components:      Result Value   CO2 21 (*)    Glucose, Bld 207 (*)    Anion gap 16 (*)    All other components within normal limits  CBC WITH DIFFERENTIAL/PLATELET - Abnormal; Notable for the following components:   WBC  12.8 (*)    RBC 5.13 (*)    Hemoglobin 15.9 (*)    HCT 46.6 (*)    Neutro Abs 9.9 (*)    Monocytes Absolute 1.2 (*)    All other components within normal limits  URIC ACID                                                                                                                          Radiology US  Venous Img Upper Left (DVT Study) Result Date: 11/15/2024 CLINICAL DATA:  pain.  LEFT hand swelling x2 days. EXAM: LEFT UPPER EXTREMITY VENOUS DOPPLER ULTRASOUND TECHNIQUE: Gray-scale sonography with graded compression, as well as color Doppler and duplex ultrasound were performed to evaluate the upper extremity deep venous system from the level of the subclavian vein and including the jugular, axillary, basilic, radial, ulnar and upper cephalic vein. Spectral Doppler was utilized to evaluate flow at rest and with distal augmentation maneuvers. COMPARISON:  LEFT upper extremity XRs, concurrent.  FINDINGS: VENOUS Normal compressibility of the LEFT internal jugular, subclavian, axillary, cephalic, basilic, brachial, radial and ulnar veins. No filling defects to suggest DVT on grayscale or color Doppler imaging. Doppler waveforms show normal direction of venous flow, normal respiratory plasticity and response to augmentation. Limited views of the contralateral subclavian vein are unremarkable. OTHER No evidence of superficial thrombophlebitis or abnormal fluid collection. Limitations: none IMPRESSION: No evidence of DVT or superficial thrombophlebitis within the LEFT upper extremity. Thom Hall, MD [facility] Radiology Electronically Signed   By: Thom Hall M.D.   On: 11/15/2024 12:12   DG Wrist Complete Left Result Date: 11/15/2024 EXAM: 3 OR MORE VIEW(S) XRAY OF THE LEFT WRIST 11/15/2024 11:36:03 AM COMPARISON: None available. CLINICAL HISTORY: pain FINDINGS: BONES AND JOINTS: No acute fracture. No focal osseous lesion. No joint dislocation. Joint  space narrowing and prominent osteophyte formation at the first [facility] joint. SOFT TISSUES: Diffuse soft tissue swelling. IMPRESSION: 1. Diffuse soft tissue swelling. 2. Moderate to marked osteoarthritis of  first [facility] joint. Electronically signed by: Rockey Kilts MD 11/15/2024 12:07 PM EST RP Workstation: [id number]    Pertinent labs & imaging results that were available during my care of the patient were reviewed by me and considered in my medical decision making (see MDM for details).  Medications Ordered in ED Medications  acetaminophen  (TYLENOL ) tablet 1,000 mg (0 mg Oral Hold 11/15/24 1106)  colchicine tablet 0.6 mg (has no administration in time range)  ibuprofen (ADVIL) tablet 800 mg (800 mg Oral Given 11/15/24 1103)  ondansetron  (ZOFRAN ) tablet 4 mg (4 mg Oral Given 11/15/24 1220)                                                                                                                                     Procedures Procedures  (including critical care time)  Medical Decision Making / ED Course   This patient presents to the ED for concern of left wrist pain, this involves an extensive number of treatment options, and is a complaint that carries with it a high risk of complications and morbidity.  The differential diagnosis includes gout, DVT, consider cellulitis.  Less likely septic arthritis.  MDM: Patient's wrist looks more like gout than a septic arthritis.  She does have a history of gout, or just following Thanksgiving holiday.  Will check some basic blood work on the patient, obtain an x-ray.  Given her history of blood clots, will obtain a DVT ultrasound though I think this is less likely.  I have lower suspicion for septic arthritis given the patient's absence of systemic symptoms.  Will check a uric acid which can be helpful if it is elevated but will not exclude gout.  Reassessment 12:51 PM-patient's plain films negative aside from arthritis.  Her DVT ultrasound is  negative.  Blood work shows a mild leukocytosis, likely reactive nonspecific.  Continue to believe that this is  gout.  Will discharge with colchicine as patient has normal renal function.  Discussed with patient and she is agreeable with this plan.  Return precautions discussed at bedside.   Additional history obtained: -Additional history obtained from husband at bedside -External records from outside source obtained and reviewed including: Chart review including previous notes, labs, imaging, consultation notes   Lab Tests: -I ordered, reviewed, and interpreted labs.   The pertinent results include:   Labs Reviewed  BASIC METABOLIC PANEL WITH GFR - Abnormal; Notable for the following components:      Result Value   CO2 21 (*)    Glucose, Bld 207 (*)    Anion gap 16 (*)    All other components within normal limits  CBC WITH DIFFERENTIAL/PLATELET - Abnormal; Notable for the following components:   WBC 12.8 (*)    RBC 5.13 (*)    Hemoglobin 15.9 (*)    HCT 46.6 (*)    Neutro Abs 9.9 (*)    Monocytes Absolute 1.2 (*)    All other components within normal limits  URIC ACID      Imaging Studies ordered: I ordered imaging studies including DVT ultrasound, x-ray wrist I independently visualized and interpreted imaging. I agree with the radiologist interpretation   Medicines ordered and prescription drug management: Meds ordered this encounter  Medications   ibuprofen (ADVIL) tablet 800 mg   acetaminophen  (TYLENOL ) tablet 1,000 mg   ondansetron  (ZOFRAN ) tablet 4 mg   colchicine tablet 0.6 mg   colchicine 0.6 MG tablet    Sig: Take 1 tablet (0.6 mg total) by mouth 2 (two) times daily for 7 days.    Dispense:  14 tablet    Refill:  0    -I have reviewed the patients home medicines and have made adjustments as needed    Cardiac Monitoring: The patient was maintained on a cardiac monitor.  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   Arthritis    Asymptomatic coronary heart disease 11/20/2017   Cancer [facility]) 2004   bladder    COPD (chronic obstructive pulmonary disease) (HCC)    Crohn's disease (HCC)    Diabetes mellitus without complication (HCC)    DVT (deep venous thrombosis) (HCC)    Dyspnea    Essential hypertension 11/20/2017   Gait difficulty    History of kidney stones    OAB (overactive bladder)    Smoker    Tobacco abuse 11/20/2017   Vertigo       Dispostion: I considered admission for this patient, however with her workup she is appropriate for outpatient follow-up.     Final Clinical Impression(s) / ED Diagnoses Final diagnoses:  Acute gout of left wrist, unspecified cause     @PCDICTATION @    Mannie Pac T, DO 11/15/24 1257

## 2024-11-15 NOTE — ED Triage Notes (Signed)
 Reports left hand and wrist swelling x 2 days. Denies injury to area. Hx of gout.

## 2024-11-15 NOTE — ED Notes (Signed)
 Xray tech at bedside.

## 2024-11-15 NOTE — Discharge Instructions (Addendum)
 I believe that you have gout.  Please begin taking colchicine 0.6 mg 2 times per day for the next 1 week.  You can also apply ice to the area, and take Tylenol .  If the colchicine causes diarrhea, you can stop taking it and take 500 mg of naproxen/Aleve 2 times per day.  Return to the emergency room if you develop worsening pain in your arm, numbness or redness swelling up your arm, develop fever or chills.

## 2024-11-16 DIAGNOSIS — M109 Gout, unspecified: Secondary | ICD-10-CM | POA: Diagnosis not present

## 2024-11-16 DIAGNOSIS — M47816 Spondylosis without myelopathy or radiculopathy, lumbar region: Secondary | ICD-10-CM | POA: Diagnosis not present

## 2024-11-17 ENCOUNTER — Telehealth (HOSPITAL_BASED_OUTPATIENT_CLINIC_OR_DEPARTMENT_OTHER): Payer: Self-pay

## 2024-11-17 DIAGNOSIS — Z7901 Long term (current) use of anticoagulants: Secondary | ICD-10-CM | POA: Diagnosis not present

## 2024-11-17 DIAGNOSIS — R1310 Dysphagia, unspecified: Secondary | ICD-10-CM | POA: Diagnosis not present

## 2024-11-17 DIAGNOSIS — R933 Abnormal findings on diagnostic imaging of other parts of digestive tract: Secondary | ICD-10-CM | POA: Diagnosis not present

## 2024-11-17 DIAGNOSIS — R140 Abdominal distension (gaseous): Secondary | ICD-10-CM | POA: Diagnosis not present

## 2024-11-17 NOTE — Telephone Encounter (Signed)
   Pre-operative Risk Assessment    Patient Name: [name]  DOB: 1941/06/13 MRN: [id number]   Date of last office visit: 08/02/24 with walker Date of next office visit: NA  Request for Surgical Clearance    Procedure:  endoscopy  Date of Surgery:  Clearance 12/22/24                                 Surgeon:  Dr. Burnette Socks Group or Practice Name:  [facility] Phone number:  [phone] Fax number:  [phone]   Type of Clearance Requested:   - Medical  - Pharmacy:  Hold Apixaban  (Eliquis ) not indicated   Type of Anesthesia:  Propofol    Additional requests/questions:    [name] [name] [name]   11/17/2024, 2:04 PM

## 2024-11-17 NOTE — Telephone Encounter (Signed)
 S/w the pt's husband who said pt is napping. I asked if she would call back to schedule a tele proep appt. Pt's husband verbalized understanding.

## 2024-11-17 NOTE — Telephone Encounter (Signed)
   Name: [name]  DOB: 01-09-1941  MRN: [id number]  Primary Cardiologist: Annabella Scarce, MD   Preoperative team, please contact this patient and set up a phone call appointment for further preoperative risk assessment. Please obtain consent and complete medication review. Thank you for your help.  I confirm that guidance regarding antiplatelet and oral anticoagulation therapy has been completed and, if necessary, noted below.  Eliquis  is followed by Dr. Pauletta Chihuahua, MD for DVT. Please reach out to that office for recommendations.  I also confirmed the patient resides in the state of [place] . As per [facility] Medical Board telemedicine laws, the patient must reside in the state in which the provider is licensed.   Lamarr Satterfield, NP 11/17/2024, 2:56 PM [facility]

## 2024-11-17 NOTE — Telephone Encounter (Signed)
 Dr. Burnette,    Eliquis  is followed by Tilden Chihuahua for DVT. Please have your staff reach out to that office for recommendations on holding this for Endoscopy.Cardiac clearance will be forthcoming.  Lamarr Satterfield DNP

## 2024-11-18 NOTE — Telephone Encounter (Signed)
Patient is returning phone call. Please advise.

## 2024-11-18 NOTE — Telephone Encounter (Signed)
 Patient scheduled for pre-op clearance on 12/12/24 with Orren Fabry, PA-C.     Patient Consent for Virtual Visit        [name] has provided verbal consent on 11/18/2024 for a virtual visit (video or telephone).   CONSENT FOR VIRTUAL VISIT FOR:  [name]  By participating in this virtual visit I agree to the following:  I hereby voluntarily request, consent and authorize [facility] and its employed or contracted physicians, physician assistants, nurse practitioners or other licensed health care professionals (the Practitioner), to provide me with telemedicine health care services (the "Services) as deemed necessary by the treating Practitioner. I acknowledge and consent to receive the Services by the Practitioner via telemedicine. I understand that the telemedicine visit will involve communicating with the Practitioner through live audiovisual communication technology and the disclosure of certain medical information by electronic transmission. I acknowledge that I have been given the opportunity to request an in-person assessment or other available alternative prior to the telemedicine visit and am voluntarily participating in the telemedicine visit.  I understand that I have the right to withhold or withdraw my consent to the use of telemedicine in the course of my care at any time, without affecting my right to future care or treatment, and that the Practitioner or I may terminate the telemedicine visit at any time. I understand that I have the right to inspect all information obtained and/or recorded in the course of the telemedicine visit and may receive copies of available information for a reasonable fee.  I understand that some of the potential risks of receiving the Services via telemedicine include:  Delay or interruption in medical evaluation due to technological equipment failure or disruption; Information transmitted may not be sufficient (e.g. poor resolution of  images) to allow for appropriate medical decision making by the Practitioner; and/or  In rare instances, security protocols could fail, causing a breach of personal health information.  Furthermore, I acknowledge that it is my responsibility to provide information about my medical history, conditions and care that is complete and accurate to the best of my ability. I acknowledge that Practitioner's advice, recommendations, and/or decision may be based on factors not within their control, such as incomplete or inaccurate data provided by me or distortions of diagnostic images or specimens that may result from electronic transmissions. I understand that the practice of medicine is not an exact science and that Practitioner makes no warranties or guarantees regarding treatment outcomes. I acknowledge that a copy of this consent can be made available to me via my patient portal [facility] MyChart), or I can request a printed copy by calling the office of [facility].    I understand that my insurance will be billed for this visit.   I have read or had this consent read to me. I understand the contents of this consent, which adequately explains the benefits and risks of the Services being provided via telemedicine.  I have been provided ample opportunity to ask questions regarding this consent and the Services and have had my questions answered to my satisfaction. I give my informed consent for the services to be provided through the use of telemedicine in my medical care

## 2024-11-24 DIAGNOSIS — M47816 Spondylosis without myelopathy or radiculopathy, lumbar region: Secondary | ICD-10-CM | POA: Diagnosis not present

## 2024-11-29 DIAGNOSIS — L84 Corns and callosities: Secondary | ICD-10-CM | POA: Diagnosis not present

## 2024-11-29 DIAGNOSIS — E1151 Type 2 diabetes mellitus with diabetic peripheral angiopathy without gangrene: Secondary | ICD-10-CM | POA: Diagnosis not present

## 2024-11-29 DIAGNOSIS — Z872 Personal history of diseases of the skin and subcutaneous tissue: Secondary | ICD-10-CM | POA: Diagnosis not present

## 2024-11-29 DIAGNOSIS — L97522 Non-pressure chronic ulcer of other part of left foot with fat layer exposed: Secondary | ICD-10-CM | POA: Diagnosis not present

## 2024-11-29 DIAGNOSIS — B351 Tinea unguium: Secondary | ICD-10-CM | POA: Diagnosis not present

## 2024-12-12 ENCOUNTER — Other Ambulatory Visit: Payer: Self-pay

## 2024-12-12 ENCOUNTER — Ambulatory Visit: Attending: Cardiovascular Disease | Admitting: Physician Assistant

## 2024-12-12 DIAGNOSIS — Z01810 Encounter for preprocedural cardiovascular examination: Secondary | ICD-10-CM

## 2024-12-12 NOTE — Progress Notes (Signed)
 "   Virtual Visit via Telephone Note   Because of [name] co-morbid illnesses, she is at least at moderate risk for complications without adequate follow up.  This format is felt to be most appropriate for this patient at this time.  Due to technical limitations with video connection (technology), today's appointment will be conducted as an audio only telehealth visit, and [name] verbally agreed to proceed in this manner.   All issues noted in this document were discussed and addressed.  No physical exam could be performed with this format.  Evaluation Performed:  Preoperative cardiovascular risk assessment _____________   Date:  12/12/2024   Patient ID:  [name], DOB [birth year], MRN [id number] Patient Location:  Home Provider location:   Office  Primary Care Provider:  Teresa Channel, MD Primary Cardiologist:  Annabella Scarce, MD  Chief Complaint / Patient Profile   83 y.o. y/o female with a h/o persistent back pain, history of coronary calcification on CT, RBBB, ascending aortic aneurysm, HTN, DM2, COPD, bladder cancer, tobacco use, and DVT who is pending endoscopy and presents today for telephonic preoperative cardiovascular risk assessment.  History of Present Illness    [name] is a 83 y.o. female who presents via audio/video conferencing for a telehealth visit today.  Pt was last seen in cardiology clinic on 08/02/2024 by Reche Finder, NP.  At that time Latrell Reitan was doing well.  The patient is now pending procedure as outlined above. Since her last visit, she has been doing fine without any chest pain or SOB. She has been using a walking stick but can complete 1-2 blocks.   She does meet 4 mets on the DASI.  Eliquis  is followed by Dr. Pauletta Chihuahua, MD for DVT. Please reach out to that office for recommendations.    Past Medical History    Past Medical History:  Diagnosis Date   Aneurysm of ascending aorta 06/11/2018   4.1cm on CT 11/2017.    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    COPD (chronic obstructive pulmonary disease) (HCC)    Crohn's disease (HCC)    Diabetes mellitus without complication (HCC)    DVT (deep venous thrombosis) (HCC)    Dyspnea    Essential hypertension 11/20/2017   Gait difficulty    History of kidney stones    OAB (overactive bladder)    Smoker    Tobacco abuse 11/20/2017   Vertigo    Past Surgical History:  Procedure Laterality Date   BLADDER SURGERY     CHOLECYSTECTOMY     FOOT ARTHRODESIS Left 07/02/2017   Procedure: Left Subtalar Arthrodesis;  Surgeon: Kit Rush, MD;  Location: [facility];  Service: Orthopedics;  Laterality: Left;   FRACTURE SURGERY     HARDWARE REMOVAL Left 04/24/2016   Procedure: REMOVAL OF DEEP IMPLANT LEFT FOOT;  Surgeon: Rush Kit, MD;  Location: [facility];  Service: Orthopedics;  Laterality: Left;  ANESTHESIA:  REGIONAL/MONITOR ANESTHESIA   HARDWARE REMOVAL Left 07/02/2017   Procedure: Removal of Deep Implants;  Surgeon: Kit Rush, MD;  Location: [facility];  Service: Orthopedics;  Laterality: Left;   JOINT REPLACEMENT     knee   PERIPHERAL VASCULAR BALLOON ANGIOPLASTY  08/11/2023   Procedure: PERIPHERAL VASCULAR BALLOON ANGIOPLASTY;  Surgeon: Serene Gaile ORN, MD;  Location: [facility];  Service: Cardiovascular;;   PERIPHERAL VASCULAR THROMBECTOMY N/A 08/11/2023   Procedure: PERIPHERAL  VASCULAR THROMBECTOMY VENOUS;  Surgeon: Serene Gaile ORN, MD;  Location: [facility];  Service: Cardiovascular;  Laterality: N/A;   throat polyps     TOTAL HIP ARTHROPLASTY Right 02/22/2024   Procedure: RIGHT TOTAL HIP ARTHROPLASTY ANTERIOR APPROACH;  Surgeon: Liam Lerner, MD;  Location: [facility];  Service: Orthopedics;  Laterality: Right;   TUBAL LIGATION      Allergies  Allergies[1]  Home Medications    Prior to Admission medications  Medication Sig Start  Date End Date Taking? Authorizing Provider  apixaban  (ELIQUIS ) 2.5 MG TABS tablet Take 1 tablet (2.5 mg total) by mouth 2 (two) times daily. 07/11/24   Tina Pauletta BROCKS, MD  atorvastatin  (LIPITOR) 40 MG tablet Take 40 mg by mouth daily.    [provider]  BIOTIN PO Take 1 tablet by mouth daily.    [provider]  calcium -vitamin D (OSCAL WITH D) 500-200 MG-UNIT tablet Take 1 tablet by mouth daily with breakfast.    [provider]  Cholecalciferol (VITAMIN D-3) 125 MCG (5000 UT) TABS Take 5,000 Units by mouth daily.    [provider]  colchicine  0.6 MG tablet Take 1 tablet (0.6 mg total) by mouth 2 (two) times daily for 7 days. 11/15/24 11/22/24  Mannie Fairy DASEN, DO  Collagen-Vitamin C-Biotin (COLLAGEN PO) Take 1 tablet by mouth daily.    [provider]  Cyanocobalamin (VITAMIN B-12) 5000 MCG TBDP Take 5,000 mcg by mouth daily.    [provider]  empagliflozin  (JARDIANCE ) 25 MG TABS tablet Take 25 mg by mouth daily. 07/21/23   [provider]  FREESTYLE LITE test strip SMARTSIG:Strip(s) 11/04/24   [provider]  latanoprost  (XALATAN ) 0.005 % ophthalmic solution Place 1 drop into both eyes at bedtime. 09/05/21   [provider]  lisinopril  (ZESTRIL ) 2.5 MG tablet Take 2.5 mg by mouth daily.    [provider]  Multiple Vitamins-Minerals (WOMENS MULTI VITAMIN & MINERAL) TABS Take 1 tablet by mouth daily.    [provider]  MYRBETRIQ 50 MG TB24 tablet Take 50 mg by mouth daily. 11/14/24   [provider]  Omega-3 Fatty Acids (FISH OIL) 1000 MG CAPS Take 1,000 mg by mouth daily.    [provider]  omeprazole (PRILOSEC) 40 MG capsule 1 capsule 1/2 to 1 hour Orally twice a day 02/17/24   [provider]  ondansetron  (ZOFRAN -ODT) 4 MG disintegrating tablet Take 4 mg by mouth every 8 (eight) hours as needed for vomiting or nausea.    [provider]  oxybutynin   (DITROPAN -XL) 10 MG 24 hr tablet Take 10 mg by mouth daily.    [provider]  oxyCODONE -acetaminophen  (PERCOCET/ROXICET) 5-325 MG tablet Take 1 tablet by mouth every 4 (four) hours as needed for severe pain (pain score 7-10). 02/22/24   Orlando Camellia POUR, PA-C  OZEMPIC, 0.25 OR 0.5 MG/DOSE, 2 MG/3ML SOPN Inject 0.5 mg into the skin every Thursday. 11/24/23   [provider]  RESTASIS  0.05 % ophthalmic emulsion Place 1 drop into both eyes 2 (two) times daily. 09/22/21   [provider]  tiZANidine  (ZANAFLEX ) 2 MG tablet Take 1 tablet (2 mg total) by mouth every 6 (six) hours as needed for muscle spasms. 02/22/24   Orlando Camellia POUR, PA-C  traMADol  (ULTRAM ) 50 MG tablet Take 50 mg by mouth 4 (four) times daily as needed for severe pain (pain score 7-10).    [provider]  umeclidinium-vilanterol (ANORO ELLIPTA ) 62.5-25 MCG/INH AEPB Inhale  1 puff into the lungs daily.    [provider]    Physical Exam    Vital Signs:  Eila Runyan does not have vital signs available for review today.  Given telephonic nature of communication, physical exam is limited. AAOx3. NAD. Normal affect.  Speech and respirations are unlabored.  Accessory Clinical Findings    None  Assessment & Plan    1.  Preoperative Cardiovascular Risk Assessment:  Ms. Boule perioperative risk of a major cardiac event is 0.4% according to the Revised Cardiac Risk Index (RCRI).  Therefore, she is at low risk for perioperative complications.   Her functional capacity is fair at 4.4 METs according to the [facility] Activity Status Index (DASI). Recommendations: According to ACC/AHA guidelines, no further cardiovascular testing needed.  The patient may proceed to surgery at acceptable risk.    The patient was advised that if she develops new symptoms prior to surgery to contact our office to arrange for a follow-up visit, and she verbalized understanding.   A copy of this note will be  routed to requesting surgeon.  Time:   Today, I have spent 5 minutes with the patient with telehealth technology discussing medical history, symptoms, and management plan.     Orren LOISE Fabry, PA-C  12/12/2024, 9:56 AM      [1]  Allergies Allergen Reactions   Crestor [Rosuvastatin]     myalgia   Metformin Hcl Er     Other Reaction(s): fecal incontinence   "

## 2024-12-13 NOTE — Telephone Encounter (Signed)
 Requesting office sent duplicate asking for eliquis  hold recommendations. I d/w the preop APP Orren Fabry, [facility] who cleared the pt yesterday; per notes:  Eliquis  is followed by Dr. Pauletta Chihuahua, MD for DVT. Please reach out to that office for recommendations.   I assured Preop APP I will re-fax notes in regard to cardiology is not following Eliquis  for the pt, see notes above.

## 2025-01-03 ENCOUNTER — Other Ambulatory Visit: Payer: Self-pay | Admitting: Family Medicine

## 2025-01-03 DIAGNOSIS — Z1231 Encounter for screening mammogram for malignant neoplasm of breast: Secondary | ICD-10-CM

## 2025-01-10 NOTE — Assessment & Plan Note (Signed)
 Developed while on aspirin  and was vaping. History of Crohn's disease, DM Will continue at prophylactic doses of apixaban . 2.5 mg twice daily Ok to hold 2 days before procedures. Resume at hemostasis at discretion of surgeon.

## 2025-01-10 NOTE — Progress Notes (Unsigned)
 [facility] OFFICE PROGRESS NOTE  Patient Care Team: Teresa Channel, MD as PCP - General (Family Medicine) Raford Riggs, MD as PCP - Cardiology (Cardiology)   84 y.o.female referred to [facility] for history of deep vein thrombosis (DVT) of femoral vein of left lower extremity. [name] has history of hypertension, spinal stenosis, CAD, tobacco abuse, ascending aorta aneurysm, emphysema.    Last surgery was in 2025 with R total hip arthroplasty.   First episode: 07/2023 Acute deep vein thrombosis (DVT) of femoral vein of left lower extremity  Setting: on aspirin  162 mg while developed dvt. Vaping.  Risk factor: Crohn's disease, personal history, DM. Treatment: apixaban  twice daily Assessment & Plan   No orders of the defined types were placed in this encounter.    Ana JAYSON Chihuahua, MD  INTERVAL HISTORY: Patient returns for follow-up.  Oncology History   No problem history exists.     PHYSICAL EXAMINATION: ECOG PERFORMANCE STATUS: {CHL ONC ECOG PS:[id number]}  There were no vitals filed for this visit. There were no vitals filed for this visit.  GENERAL: alert, no distress and comfortable SKIN: skin color normal and no jaundice or bruising or petechiae on exposed skin EYES: normal, sclera clear OROPHARYNX: no exudate  NECK: No palpable mass LYMPH:  no palpable cervical, axillary lymphadenopathy  LUNGS: clear to auscultation and no wheeze or rales with normal breathing effort HEART: regular rate & rhythm  ABDOMEN: abdomen soft, non-tender and nondistended. Musculoskeletal: no edema NEURO: no focal motor/sensory deficits  Relevant data reviewed during this visit included labs.  New labs ordered.

## 2025-01-12 ENCOUNTER — Inpatient Hospital Stay

## 2025-01-12 VITALS — BP 104/65 | HR 66 | Temp 97.5°F | Resp 17 | Ht 63.5 in | Wt 168.0 lb

## 2025-01-12 DIAGNOSIS — E119 Type 2 diabetes mellitus without complications: Secondary | ICD-10-CM | POA: Diagnosis not present

## 2025-01-12 DIAGNOSIS — Z7409 Other reduced mobility: Secondary | ICD-10-CM | POA: Insufficient documentation

## 2025-01-12 DIAGNOSIS — Z7901 Long term (current) use of anticoagulants: Secondary | ICD-10-CM | POA: Diagnosis not present

## 2025-01-12 DIAGNOSIS — Z86718 Personal history of other venous thrombosis and embolism: Secondary | ICD-10-CM | POA: Diagnosis present

## 2025-01-12 DIAGNOSIS — K5090 Crohn's disease, unspecified, without complications: Secondary | ICD-10-CM | POA: Insufficient documentation

## 2025-01-12 LAB — CBC WITH DIFFERENTIAL (CANCER CENTER ONLY)
Abs Immature Granulocytes: 0.03 10*3/uL (ref 0.00–0.07)
Basophils Absolute: 0 10*3/uL (ref 0.0–0.1)
Basophils Relative: 0 %
Eosinophils Absolute: 0.3 10*3/uL (ref 0.0–0.5)
Eosinophils Relative: 3 %
HCT: 48.7 % — ABNORMAL HIGH (ref 36.0–46.0)
Hemoglobin: 16.6 g/dL — ABNORMAL HIGH (ref 12.0–15.0)
Immature Granulocytes: 0 %
Lymphocytes Relative: 25 %
Lymphs Abs: 2.2 10*3/uL (ref 0.7–4.0)
MCH: 30.9 pg (ref 26.0–34.0)
MCHC: 34.1 g/dL (ref 30.0–36.0)
MCV: 90.7 fL (ref 80.0–100.0)
Monocytes Absolute: 0.7 10*3/uL (ref 0.1–1.0)
Monocytes Relative: 8 %
Neutro Abs: 5.7 10*3/uL (ref 1.7–7.7)
Neutrophils Relative %: 64 %
Platelet Count: 175 10*3/uL (ref 150–400)
RBC: 5.37 MIL/uL — ABNORMAL HIGH (ref 3.87–5.11)
RDW: 15.4 % (ref 11.5–15.5)
WBC Count: 8.8 10*3/uL (ref 4.0–10.5)
nRBC: 0 % (ref 0.0–0.2)

## 2025-01-12 LAB — PROTIME-INR
INR: 1.1 (ref 0.8–1.2)
Prothrombin Time: 14.8 s (ref 11.4–15.2)

## 2025-01-20 ENCOUNTER — Ambulatory Visit: Admission: RE | Admit: 2025-01-20 | Source: Ambulatory Visit

## 2025-01-20 DIAGNOSIS — Z1231 Encounter for screening mammogram for malignant neoplasm of breast: Secondary | ICD-10-CM

## 2025-07-07 ENCOUNTER — Inpatient Hospital Stay
